# Patient Record
Sex: Male | Born: 1935 | Race: Black or African American | Hispanic: No | Marital: Married
Health system: Southern US, Community
[De-identification: ages and names within clinical notes are randomized; demographics above are authoritative.]

## PROBLEM LIST (undated history)

## (undated) DIAGNOSIS — J449 Chronic obstructive pulmonary disease, unspecified: Secondary | ICD-10-CM

## (undated) DIAGNOSIS — K635 Polyp of colon: Secondary | ICD-10-CM

## (undated) DIAGNOSIS — E119 Type 2 diabetes mellitus without complications: Secondary | ICD-10-CM

## (undated) DIAGNOSIS — D509 Iron deficiency anemia, unspecified: Secondary | ICD-10-CM

## (undated) DIAGNOSIS — N400 Enlarged prostate without lower urinary tract symptoms: Secondary | ICD-10-CM

## (undated) DIAGNOSIS — I10 Essential (primary) hypertension: Secondary | ICD-10-CM

## (undated) DIAGNOSIS — I739 Peripheral vascular disease, unspecified: Secondary | ICD-10-CM

## (undated) DIAGNOSIS — C3490 Malignant neoplasm of unspecified part of unspecified bronchus or lung: Secondary | ICD-10-CM

## (undated) DIAGNOSIS — I4892 Unspecified atrial flutter: Secondary | ICD-10-CM

## (undated) DIAGNOSIS — M1990 Unspecified osteoarthritis, unspecified site: Secondary | ICD-10-CM

## (undated) DIAGNOSIS — E785 Hyperlipidemia, unspecified: Secondary | ICD-10-CM

## (undated) DIAGNOSIS — I2510 Atherosclerotic heart disease of native coronary artery without angina pectoris: Secondary | ICD-10-CM

## (undated) HISTORY — DX: Atherosclerotic heart disease of native coronary artery without angina pectoris: I25.10

## (undated) HISTORY — DX: Type 2 diabetes mellitus without complications: E11.9

## (undated) HISTORY — DX: Unspecified osteoarthritis, unspecified site: M19.90

## (undated) HISTORY — DX: Peripheral vascular disease, unspecified: I73.9

## (undated) HISTORY — PX: SOFT TISSUE CYST EXCISION: SHX2418

---

## 1999-03-28 ENCOUNTER — Encounter: Payer: Self-pay | Admitting: Internal Medicine

## 1999-03-28 ENCOUNTER — Ambulatory Visit (HOSPITAL_COMMUNITY): Admission: RE | Admit: 1999-03-28 | Discharge: 1999-03-28 | Payer: Self-pay | Admitting: Internal Medicine

## 2000-01-07 ENCOUNTER — Emergency Department (HOSPITAL_COMMUNITY): Admission: EM | Admit: 2000-01-07 | Discharge: 2000-01-08 | Payer: Self-pay | Admitting: Emergency Medicine

## 2003-11-29 ENCOUNTER — Ambulatory Visit: Admission: RE | Admit: 2003-11-29 | Discharge: 2004-02-27 | Payer: Self-pay | Admitting: Radiation Oncology

## 2009-12-28 ENCOUNTER — Ambulatory Visit: Payer: Self-pay | Admitting: Vascular Surgery

## 2011-01-23 ENCOUNTER — Encounter (INDEPENDENT_AMBULATORY_CARE_PROVIDER_SITE_OTHER): Payer: Medicare Other

## 2011-01-23 ENCOUNTER — Ambulatory Visit (INDEPENDENT_AMBULATORY_CARE_PROVIDER_SITE_OTHER): Payer: Medicare Other | Admitting: Vascular Surgery

## 2011-01-23 DIAGNOSIS — I70219 Atherosclerosis of native arteries of extremities with intermittent claudication, unspecified extremity: Secondary | ICD-10-CM

## 2011-01-24 NOTE — Assessment & Plan Note (Signed)
OFFICE VISIT  Brendan Hall DOB:  [birth year]                                       01/23/2011 ZOXWR#:[id number]  I saw Brendan Hall in the office today for continued follow-up of his peripheral vascular disease.  I had originally seen in consultation on December 28, 2009 with evidence of bilateral infrainguinal arterial occlusive disease.  We encouraged him to get on a structured walking program.  Fortunately he is not a smoker.  He comes in for a yearly follow-up visit.  He states that he continues to have pain in both legs that is aggravated by ambulation and relieved with rest.  This for the most part involves his calves.  He has had no significant thigh or hip claudication.  His symptoms have been stable over the last year.  He has had no rest pain and no history of a nonhealing wound.  He does note occasional paresthesias in the right foot.  He has had no history of DVT.  PAST MEDICAL HISTORY:  Past medical history is not changed.  He has a history of diabetes, hypertension, hypercholesterolemia, and COPD.  SOCIAL HISTORY:  He is married.  He has 4 children.  He does not use tobacco.  REVIEW OF SYSTEMS:  CARDIOVASCULAR:  He has had no chest pain, chest pressure, palpitations or arrhythmias.  He does admit to dyspnea on exertion.  PULMONARY:  He does wear home O2 at times with his history of COPD.  He has had no recent productive cough, bronchitis, asthma or wheezing.  PHYSICAL EXAMINATION:  This is a pleasant 77 year old gentleman who appears his stated age. Blood pressure 121/79, heart rate is 81, saturation 96%. LUNGS:  Clear bilaterally to auscultation without rales, rhonchi or wheezing. CARDIOVASCULAR:  I do not detect any carotid bruits.  He has a regular rate and rhythm.  He has palpable femoral pulses.  I cannot palpate popliteal or pedal pulses on either side.  He has no significant lower extremity swelling. ABDOMEN:  Soft and  nontender with normal pitched bowel sounds. NEUROLOGIC:  He has no focal weakness or paresthesias. SKIN:  There are no ulcers or rashes.  I have independently interpreted his arterial Doppler study which shows biphasic Doppler signals in the right foot in the dorsalis pedis and posterior tibial positions.  ABI is 77% on the right which is stable. On the left side, he has a monophasic posterior tibial signal with a biphasic anterior tibial signal.  ABI is 76%.  I have explained that I think his infrainguinal arterial occlusive disease is stable.  I have encouraged him to stay as active as possible. He does not need routine follow-up with me unless his symptoms progress or he develops any new vascular issues.  I have ordered follow-up ABIs for 2 years.  He knows to call if he has any concerns.    Brendan Hall, M.D. Electronically Signed  CSD/MEDQ  D:  01/23/2011  T:  01/24/2011  Job:  [id number]  cc:   [place]

## 2011-04-09 NOTE — Consult Note (Signed)
NEW PATIENT CONSULTATION   BRYTEN, MAHER L  DOB:  Jul 08, 1930                                       12/28/2009  ZOXWR#:[id number]   I saw the patient in the office today in consultation concerning his  peripheral vascular disease.  This is a pleasant 77 year old gentleman  who was referred by Dr. Virgina Organ with abnormal ABIs.  He states that he  developed the gradual onset of claudication in both lower extremities  approximately 7 or 8 months ago.  He experiences claudication in his  thighs and calves bilaterally associated with ambulation and relieved  with rest.  His symptoms have been stable over the last 7-8 months.  There are no other aggravating or alleviating factors.  He does  occasionally have some paresthesias in his feet likely related to his  diabetes.  He has had no history of rest pain and no history of  nonhealing ulcers.   PAST MEDICAL HISTORY:  His past medical history is significant for adult  onset diabetes.  He does not require insulin.  In addition, he has a  history of hypertension and hypercholesterolemia.  He also has COPD.  He  denies any previous history of myocardial infarction or history of  congestive heart failure.   FAMILY HISTORY:  There is no history of premature cardiovascular  disease.   SOCIAL HISTORY:  He is married.  He has four children.  He quit tobacco  in 2008.  He had smoked one and a half packs per day for 50 years prior  to this.   MEDICATIONS:  Are documented on the medical history form in his chart.  Of note, he is on aspirin.   REVIEW OF SYSTEMS:  GENERAL:  He has had some weight gain recently.  He  is 178 pounds.  CARDIOVASCULAR:  He has had no chest pain, chest pressure, palpitations  or arrhythmias.  He does admit to dyspnea on exertion.  He has had no  history of stroke or TIAs.  He has had no history of DVT or phlebitis.  PULMONARY:  He has a productive cough at times with wheezing and he  occasionally  uses home O2.  GI:  He has problems with constipation occasionally.  MUSCULOSKELETAL:  He does have arthritis.  GU, psychiatric, ENT, hematologic, integumentary review of systems is  unremarkable.   PHYSICAL EXAMINATION:  General:  This is a pleasant 77 year old  gentleman who appears his stated age.  Vital signs:  His blood pressure  is 110/70, heart rate is 89 and saturation 97%.  HEENT:  Pupils are  equal, round, reactive to light and accommodation.  Extraocular motions  are intact.  Conjunctivae are normal.  Neck:  Supple.  There is no  jugular venous distention.  Lungs:  Are clear bilaterally to  auscultation without rales, rhonchi or wheezing.  Cardiovascular:  I do  not detect any carotid bruits.  He has a regular rate and rhythm without  murmur appreciated.  He has no significant peripheral edema.  He has  palpable radial and femoral pulses bilaterally.  I cannot palpate  popliteal or pedal pulses on either side.  Abdomen:  Soft and nontender  with normal pitched bowel sounds.  I cannot palpate an aneurysm.  He has  normal pitched bowel sounds.  Musculoskeletal:  There are no major  deformities or cyanosis.  Neurological:  There is no focal weakness or  paresthesias.  Skin:  There are no ulcers or rashes.   I did independently interpret his arterial Doppler study today which  shows a biphasic posterior tibial signal on the right with a monophasic  dorsalis pedis signal.  ABI on the right is 73%.  On the left side he  has a biphasic posterior tibial and dorsalis pedis signal with an ABI of  70%.   Based on his exam I think he has evidence of moderate infrainguinal  arterial occlusive disease bilaterally.  He likely has bilateral  superficial femoral artery occlusive disease.  Given ABIs of  approximately 70% bilaterally this would suggest disease at one level.  Fortunately he has quit smoking.  I have discussed with him the  importance of a structured walking program to help  maintain collaterals.  We would only consider arteriography and revascularization if his  symptoms progressed and he developed disabling claudication, rest pain  or nonhealing ulcer.  I plan on seeing him back in 1 year to be sure  that his symptoms have not progressed.  He knows to call sooner if he  has problems.  Will arrange for followup ABIs at that time.     Di Kindle. Edilia Bo, M.D.  Electronically Signed   CSD/MEDQ  D:  12/28/2009  T:  12/29/2009  Job:  [id number]   cc:   Dr Virgina Organ

## 2011-08-26 DIAGNOSIS — K635 Polyp of colon: Secondary | ICD-10-CM

## 2011-08-26 HISTORY — DX: Polyp of colon: K63.5

## 2011-08-27 MED ORDER — SODIUM CHLORIDE 0.45 % IV SOLN
Freq: Once | INTRAVENOUS | Status: AC
  Administered 2011-08-28: 09:00:00 via INTRAVENOUS

## 2011-08-28 ENCOUNTER — Other Ambulatory Visit (INDEPENDENT_AMBULATORY_CARE_PROVIDER_SITE_OTHER): Payer: Self-pay | Admitting: Internal Medicine

## 2011-08-28 ENCOUNTER — Encounter (HOSPITAL_COMMUNITY)
Admission: RE | Disposition: A | Discharge status: Home / Self Care | Payer: Self-pay | Source: Ambulatory Visit | Attending: Internal Medicine

## 2011-08-28 ENCOUNTER — Encounter (INDEPENDENT_AMBULATORY_CARE_PROVIDER_SITE_OTHER): Payer: Medicare Other | Admitting: Internal Medicine

## 2011-08-28 ENCOUNTER — Ambulatory Visit (HOSPITAL_COMMUNITY)
Admission: RE | Admit: 2011-08-28 | Discharge: 2011-08-28 | Disposition: A | Discharge status: Home / Self Care | Payer: Medicare Other | Source: Ambulatory Visit | Attending: Internal Medicine | Admitting: Internal Medicine

## 2011-08-28 ENCOUNTER — Encounter (HOSPITAL_COMMUNITY): Payer: Self-pay | Admitting: *Deleted

## 2011-08-28 DIAGNOSIS — D126 Benign neoplasm of colon, unspecified: Secondary | ICD-10-CM

## 2011-08-28 DIAGNOSIS — E119 Type 2 diabetes mellitus without complications: Secondary | ICD-10-CM | POA: Insufficient documentation

## 2011-08-28 DIAGNOSIS — E785 Hyperlipidemia, unspecified: Secondary | ICD-10-CM | POA: Insufficient documentation

## 2011-08-28 DIAGNOSIS — I10 Essential (primary) hypertension: Secondary | ICD-10-CM | POA: Insufficient documentation

## 2011-08-28 DIAGNOSIS — Z85118 Personal history of other malignant neoplasm of bronchus and lung: Secondary | ICD-10-CM | POA: Insufficient documentation

## 2011-08-28 DIAGNOSIS — Z79899 Other long term (current) drug therapy: Secondary | ICD-10-CM | POA: Insufficient documentation

## 2011-08-28 DIAGNOSIS — J449 Chronic obstructive pulmonary disease, unspecified: Secondary | ICD-10-CM | POA: Insufficient documentation

## 2011-08-28 DIAGNOSIS — Z1211 Encounter for screening for malignant neoplasm of colon: Secondary | ICD-10-CM | POA: Insufficient documentation

## 2011-08-28 DIAGNOSIS — Z01812 Encounter for preprocedural laboratory examination: Secondary | ICD-10-CM | POA: Insufficient documentation

## 2011-08-28 DIAGNOSIS — J4489 Other specified chronic obstructive pulmonary disease: Secondary | ICD-10-CM | POA: Insufficient documentation

## 2011-08-28 HISTORY — DX: Malignant neoplasm of unspecified part of unspecified bronchus or lung: C34.90

## 2011-08-28 HISTORY — DX: Hyperlipidemia, unspecified: E78.5

## 2011-08-28 HISTORY — DX: Essential (primary) hypertension: I10

## 2011-08-28 HISTORY — PX: COLONOSCOPY: SHX5424

## 2011-08-28 LAB — GLUCOSE, CAPILLARY: Glucose-Capillary: 104 mg/dL — ABNORMAL HIGH (ref 70–99)

## 2011-08-28 SURGERY — COLONOSCOPY
Anesthesia: Moderate Sedation

## 2011-08-28 MED ORDER — MIDAZOLAM HCL 5 MG/5ML IJ SOLN
INTRAMUSCULAR | Status: DC | PRN
  Administered 2011-08-28 (×2): 1 mg via INTRAVENOUS

## 2011-08-28 MED ORDER — MIDAZOLAM HCL 5 MG/5ML IJ SOLN
INTRAMUSCULAR | Status: AC
  Filled 2011-08-28: qty 10

## 2011-08-28 MED ORDER — MEPERIDINE HCL 50 MG/ML IJ SOLN
INTRAMUSCULAR | Status: DC | PRN
  Administered 2011-08-28: 25 mg via INTRAVENOUS

## 2011-08-28 MED ORDER — MEPERIDINE HCL 50 MG/ML IJ SOLN
INTRAMUSCULAR | Status: AC
  Filled 2011-08-28: qty 1

## 2011-08-28 NOTE — Op Note (Signed)
COLONOSCOPY PROCEDURE REPORT  PATIENT:  Brendan Hall  MR#:  [id number] Birthdate:  [birth year], 77 y.o., male Endoscopist:  Dr. Malissa Hippo, MD Referred By:  Dr. Colon Branch, MD Procedure Date: 08/28/2011  Procedure:   Colonoscopy  Indications:  Patient is 77 year old male who is undergoing screening colonoscopy. This is patient's first exam. Personal history is positive for lung carcinoma; he remains in remission  Informed Consent: Procedure and risks were reviewed with the patient and informed consent was obtained Medications:  Demerol 25 mg IV Versed 2 mg IV  Description of procedure:  After a digital rectal exam was performed, that colonoscope was advanced from the anus through the rectum and colon to the area of the cecum, ileocecal valve and appendiceal orifice. The cecum was deeply intubated. These structures were well-seen and photographed for the record. From the level of the cecum and ileocecal valve, the scope was slowly and cautiously withdrawn. The mucosal surfaces were carefully surveyed utilizing scope tip to flexion to facilitate fold flattening as needed. The scope was pulled down into the rectum where a thorough exam including retroflexion was performed.  Findings:   Preparation satisfactory. 4 mm polyp ablated via cold biopsy from ascending colon. 3 mm polyp ablated via cold biopsy from splenic flexure. Rest of the examination was normal. Unremarkable anorectal junction.    Therapeutic/Diagnostic Maneuvers Performed:  See above  Complications:  None  Cecal Withdrawal Time:  13 minutes  Impression:  Examination performed to cecum. 2 small polyps ablated via cold biopsy one from ascending colon and second one from splenic flexure.  Recommendations:  Standard instructions given Will be contacting patient with results of biopsy.  REHMAN,NAJEEB U  08/28/2011 10:02 AM  CC: Dr. Colon Branch, MD & Dr. Bonnetta Barry ref. provider found

## 2011-08-28 NOTE — H&P (Signed)
Brendan Hall is an 77 y.o. male.   Chief Complaint: Patient is here for screening colonoscopy HPI: Patient is 77 year old Afro-American male who is here for average risk screening colonoscopy. This is patient's first exam. Since he is been treated for lung carcinoma Dr. Virgina Organ felt that he should undergo this exam. She denies abdominal pain rectal bleeding or change in his bowel habits. He has COPD since he had lung surgery for CA about 6 years ago. He uses inhalers every day. He states if he walks at his own pace he does not get short of breath.  Past Medical History  Diagnosis Date  . Myocardial infarction     30 years ago  . Lung cancer   . Shortness of breath   . Hyperlipidemia   . Hypertension   . Diabetes mellitus   . Arthritis     Past Surgical History  Procedure Date  . Cyst removed from face     History reviewed. No pertinent family history. Social History:  reports that he has quit smoking. He does not have any smokeless tobacco history on file. He reports that he drinks alcohol. He reports that he does not use illicit drugs.  Allergies: No Known Allergies  Medications Prior to Admission  Medication Dose Route Frequency Provider Last Rate Last Dose  . 0.45 % sodium chloride infusion   Intravenous Once Malissa Hippo, MD 20 mL/hr at 08/28/11 0847    . meperidine (DEMEROL) 50 MG/ML injection           . midazolam (VERSED) 5 MG/5ML injection            Medications Prior to Admission  Medication Sig Dispense Refill  . Fluticasone-Salmeterol (ADVAIR) 250-50 MCG/DOSE AEPB Inhale 1 puff into the lungs every 12 (twelve) hours.        . furosemide (LASIX) 40 MG tablet Take 40 mg by mouth daily.        Marland Kitchen glyBURIDE-metformin (GLUCOVANCE) 5-500 MG per tablet Take 1 tablet by mouth 3 (three) times daily.        . simvastatin (ZOCOR) 40 MG tablet Take 40 mg by mouth at bedtime.        . Tamsulosin HCl (FLOMAX) 0.4 MG CAPS Take 0.4 mg by mouth daily.          Results for  orders placed during the hospital encounter of 08/28/11 (from the past 48 hour(s))  GLUCOSE, CAPILLARY     Status: Abnormal   Collection Time   08/28/11  8:40 AM      Component Value Range Comment   Glucose-Capillary 104 (*) 70 - 99 (mg/dL)    No results found.  Review of Systems  Constitutional: Negative for weight loss.  Respiratory: Positive for shortness of breath (if he walks too fast).   Gastrointestinal: Negative for abdominal pain, diarrhea, constipation, blood in stool and melena.    Blood pressure 133/95, pulse 90, temperature 98.4 F (36.9 C), temperature source Oral, resp. rate 18, height 5\' 7"  (1.702 m), weight 172 lb (78.019 kg), SpO2 100.00%. Physical Exam  Constitutional: He appears well-developed and well-nourished.  HENT:  Mouth/Throat: Oropharynx is clear and moist.  Eyes: Conjunctivae are normal. No scleral icterus.  Neck: No thyromegaly present.  Cardiovascular: Normal rate, regular rhythm and normal heart sounds.   No murmur heard. Respiratory: Effort normal. He has no wheezes. He has no rales.       Increased AP diameter Degrees intensity of breath sounds bilaterally.  GI: Soft.  He exhibits no distension and no mass. There is no tenderness.  Musculoskeletal: He exhibits no edema.  Lymphadenopathy:    He has no cervical adenopathy.  Neurological: He is alert.  Skin: Skin is warm and dry.     Assessment/Plan Average risk screening colonoscopy.  REHMAN,NAJEEB U 08/28/2011, 9:20 AM

## 2011-09-04 ENCOUNTER — Encounter (HOSPITAL_COMMUNITY): Payer: Self-pay | Admitting: Internal Medicine

## 2011-09-04 ENCOUNTER — Encounter (INDEPENDENT_AMBULATORY_CARE_PROVIDER_SITE_OTHER): Payer: Self-pay | Admitting: *Deleted

## 2011-11-14 ENCOUNTER — Emergency Department (HOSPITAL_COMMUNITY): Payer: Medicare Other

## 2011-11-14 ENCOUNTER — Encounter (HOSPITAL_COMMUNITY): Payer: Self-pay | Admitting: *Deleted

## 2011-11-14 ENCOUNTER — Inpatient Hospital Stay (HOSPITAL_COMMUNITY)
Admission: EM | Admit: 2011-11-14 | Discharge: 2011-11-18 | DRG: 191 | Disposition: A | Discharge status: Home Health | Payer: Medicare Other | Attending: Internal Medicine | Admitting: Internal Medicine

## 2011-11-14 ENCOUNTER — Other Ambulatory Visit: Payer: Self-pay

## 2011-11-14 DIAGNOSIS — N289 Disorder of kidney and ureter, unspecified: Secondary | ICD-10-CM

## 2011-11-14 DIAGNOSIS — N179 Acute kidney failure, unspecified: Secondary | ICD-10-CM | POA: Diagnosis present

## 2011-11-14 DIAGNOSIS — E7800 Pure hypercholesterolemia, unspecified: Secondary | ICD-10-CM

## 2011-11-14 DIAGNOSIS — IMO0001 Reserved for inherently not codable concepts without codable children: Secondary | ICD-10-CM | POA: Diagnosis present

## 2011-11-14 DIAGNOSIS — Z85118 Personal history of other malignant neoplasm of bronchus and lung: Secondary | ICD-10-CM

## 2011-11-14 DIAGNOSIS — J440 Chronic obstructive pulmonary disease with acute lower respiratory infection: Principal | ICD-10-CM | POA: Diagnosis present

## 2011-11-14 DIAGNOSIS — N138 Other obstructive and reflux uropathy: Secondary | ICD-10-CM | POA: Diagnosis present

## 2011-11-14 DIAGNOSIS — N281 Cyst of kidney, acquired: Secondary | ICD-10-CM | POA: Diagnosis present

## 2011-11-14 DIAGNOSIS — E119 Type 2 diabetes mellitus without complications: Secondary | ICD-10-CM | POA: Diagnosis present

## 2011-11-14 DIAGNOSIS — J209 Acute bronchitis, unspecified: Principal | ICD-10-CM | POA: Diagnosis present

## 2011-11-14 DIAGNOSIS — D509 Iron deficiency anemia, unspecified: Secondary | ICD-10-CM | POA: Diagnosis present

## 2011-11-14 DIAGNOSIS — J441 Chronic obstructive pulmonary disease with (acute) exacerbation: Secondary | ICD-10-CM

## 2011-11-14 DIAGNOSIS — D649 Anemia, unspecified: Secondary | ICD-10-CM

## 2011-11-14 DIAGNOSIS — Z860100 Personal history of colon polyps, unspecified: Secondary | ICD-10-CM

## 2011-11-14 DIAGNOSIS — N401 Enlarged prostate with lower urinary tract symptoms: Secondary | ICD-10-CM | POA: Diagnosis present

## 2011-11-14 DIAGNOSIS — R339 Retention of urine, unspecified: Secondary | ICD-10-CM

## 2011-11-14 DIAGNOSIS — N2889 Other specified disorders of kidney and ureter: Secondary | ICD-10-CM

## 2011-11-14 HISTORY — DX: Benign prostatic hyperplasia without lower urinary tract symptoms: N40.0

## 2011-11-14 HISTORY — DX: Iron deficiency anemia, unspecified: D50.9

## 2011-11-14 HISTORY — DX: Chronic obstructive pulmonary disease, unspecified: J44.9

## 2011-11-14 HISTORY — DX: Polyp of colon: K63.5

## 2011-11-14 LAB — DIFFERENTIAL
Eosinophils Relative: 2 % (ref 0–5)
Lymphocytes Relative: 20 % (ref 12–46)
Lymphs Abs: 1.2 10*3/uL (ref 0.7–4.0)
Monocytes Relative: 10 % (ref 3–12)

## 2011-11-14 LAB — CBC
HCT: 39.4 % (ref 39.0–52.0)
Hemoglobin: 11.4 g/dL — ABNORMAL LOW (ref 13.0–17.0)
MCV: 85.5 fL (ref 78.0–100.0)
Platelets: 304 10*3/uL (ref 150–400)
RBC: 4.61 MIL/uL (ref 4.22–5.81)
WBC: 6.2 10*3/uL (ref 4.0–10.5)

## 2011-11-14 LAB — BASIC METABOLIC PANEL
CO2: 28 mEq/L (ref 19–32)
Calcium: 10.3 mg/dL (ref 8.4–10.5)
Chloride: 105 mEq/L (ref 96–112)
Potassium: 4.1 mEq/L (ref 3.5–5.1)
Sodium: 142 mEq/L (ref 135–145)

## 2011-11-14 LAB — POCT I-STAT TROPONIN I: Troponin i, poc: 0 ng/mL (ref 0.00–0.08)

## 2011-11-14 MED ORDER — HEPARIN SODIUM (PORCINE) 5000 UNIT/ML IJ SOLN
5000.0000 [IU] | Freq: Three times a day (TID) | INTRAMUSCULAR | Status: DC
  Administered 2011-11-14 – 2011-11-18 (×10): 5000 [IU] via SUBCUTANEOUS
  Filled 2011-11-14 (×9): qty 1

## 2011-11-14 MED ORDER — ACETAMINOPHEN 325 MG PO TABS: 650.0000 mg | ORAL_TABLET | Freq: Four times a day (QID) | ORAL | Status: DC | PRN

## 2011-11-14 MED ORDER — SODIUM CHLORIDE 0.9 % IV SOLN: INTRAVENOUS | Status: DC

## 2011-11-14 MED ORDER — ACETAMINOPHEN 650 MG RE SUPP: 650.0000 mg | Freq: Four times a day (QID) | RECTAL | Status: DC | PRN

## 2011-11-14 MED ORDER — ALBUTEROL SULFATE (5 MG/ML) 0.5% IN NEBU
10.0000 mg | INHALATION_SOLUTION | Freq: Once | RESPIRATORY_TRACT | Status: AC
  Administered 2011-11-14: 10 mg via RESPIRATORY_TRACT
  Filled 2011-11-14: qty 0.5
  Filled 2011-11-14: qty 2

## 2011-11-14 MED ORDER — ASPIRIN EC 81 MG PO TBEC
81.0000 mg | DELAYED_RELEASE_TABLET | Freq: Every day | ORAL | Status: DC
  Administered 2011-11-14 – 2011-11-18 (×5): 81 mg via ORAL
  Filled 2011-11-14 (×5): qty 1

## 2011-11-14 MED ORDER — FUROSEMIDE 40 MG PO TABS
40.0000 mg | ORAL_TABLET | Freq: Every day | ORAL | Status: DC
  Administered 2011-11-14 – 2011-11-18 (×5): 40 mg via ORAL
  Filled 2011-11-14 (×5): qty 1

## 2011-11-14 MED ORDER — BIOTENE DRY MOUTH MT LIQD
15.0000 mL | Freq: Two times a day (BID) | OROMUCOSAL | Status: DC
  Administered 2011-11-14 – 2011-11-18 (×8): 15 mL via OROMUCOSAL

## 2011-11-14 MED ORDER — INSULIN ASPART 100 UNIT/ML ~~LOC~~ SOLN
0.0000 [IU] | Freq: Every day | SUBCUTANEOUS | Status: DC
  Administered 2011-11-14: 4 [IU] via SUBCUTANEOUS
  Administered 2011-11-15 – 2011-11-16 (×2): 3 [IU] via SUBCUTANEOUS
  Administered 2011-11-17: 2 [IU] via SUBCUTANEOUS

## 2011-11-14 MED ORDER — METHYLPREDNISOLONE SODIUM SUCC 125 MG IJ SOLR
60.0000 mg | Freq: Four times a day (QID) | INTRAMUSCULAR | Status: DC
  Administered 2011-11-15 – 2011-11-17 (×10): 60 mg via INTRAVENOUS
  Filled 2011-11-14 (×8): qty 2

## 2011-11-14 MED ORDER — IPRATROPIUM BROMIDE 0.02 % IN SOLN
1.0000 mg | Freq: Once | RESPIRATORY_TRACT | Status: AC
  Administered 2011-11-14: 1 mg via RESPIRATORY_TRACT
  Filled 2011-11-14 (×2): qty 2.5

## 2011-11-14 MED ORDER — INSULIN ASPART 100 UNIT/ML ~~LOC~~ SOLN
0.0000 [IU] | Freq: Three times a day (TID) | SUBCUTANEOUS | Status: DC
  Administered 2011-11-15: 15 [IU] via SUBCUTANEOUS
  Administered 2011-11-15 – 2011-11-16 (×2): 11 [IU] via SUBCUTANEOUS
  Administered 2011-11-16: 3 [IU] via SUBCUTANEOUS
  Administered 2011-11-16 – 2011-11-17 (×4): 7 [IU] via SUBCUTANEOUS
  Administered 2011-11-18: 4 [IU] via SUBCUTANEOUS
  Administered 2011-11-18: 7 [IU] via SUBCUTANEOUS
  Filled 2011-11-14: qty 3

## 2011-11-14 MED ORDER — ALBUTEROL SULFATE (5 MG/ML) 0.5% IN NEBU
2.5000 mg | INHALATION_SOLUTION | RESPIRATORY_TRACT | Status: DC
  Administered 2011-11-14 – 2011-11-17 (×14): 2.5 mg via RESPIRATORY_TRACT
  Filled 2011-11-14 (×14): qty 0.5

## 2011-11-14 MED ORDER — IPRATROPIUM BROMIDE 0.02 % IN SOLN
0.5000 mg | RESPIRATORY_TRACT | Status: DC
  Administered 2011-11-14 – 2011-11-17 (×15): 0.5 mg via RESPIRATORY_TRACT
  Filled 2011-11-14 (×14): qty 2.5

## 2011-11-14 MED ORDER — SODIUM CHLORIDE 0.9 % IV SOLN
INTRAVENOUS | Status: DC
  Administered 2011-11-14: 14:00:00 via INTRAVENOUS

## 2011-11-14 MED ORDER — MORPHINE SULFATE 2 MG/ML IJ SOLN: 1.0000 mg | INTRAMUSCULAR | Status: DC | PRN

## 2011-11-14 MED ORDER — MOXIFLOXACIN HCL IN NACL 400 MG/250ML IV SOLN
400.0000 mg | INTRAVENOUS | Status: DC
  Administered 2011-11-15 – 2011-11-16 (×2): 400 mg via INTRAVENOUS
  Filled 2011-11-14 (×3): qty 250

## 2011-11-14 MED ORDER — ONDANSETRON HCL 4 MG PO TABS: 4.0000 mg | ORAL_TABLET | Freq: Four times a day (QID) | ORAL | Status: DC | PRN

## 2011-11-14 MED ORDER — METHYLPREDNISOLONE SODIUM SUCC 125 MG IJ SOLR
125.0000 mg | Freq: Once | INTRAMUSCULAR | Status: AC
  Administered 2011-11-14: 125 mg via INTRAVENOUS
  Filled 2011-11-14: qty 2

## 2011-11-14 MED ORDER — ALBUTEROL SULFATE (5 MG/ML) 0.5% IN NEBU
2.5000 mg | INHALATION_SOLUTION | RESPIRATORY_TRACT | Status: DC | PRN
  Administered 2011-11-18: 2.5 mg via RESPIRATORY_TRACT
  Filled 2011-11-14: qty 0.5

## 2011-11-14 MED ORDER — METHYLPREDNISOLONE SODIUM SUCC 125 MG IJ SOLR
80.0000 mg | Freq: Once | INTRAMUSCULAR | Status: AC
  Administered 2011-11-14: 80 mg via INTRAVENOUS
  Filled 2011-11-14: qty 2

## 2011-11-14 MED ORDER — MOXIFLOXACIN HCL IN NACL 400 MG/250ML IV SOLN
400.0000 mg | Freq: Once | INTRAVENOUS | Status: AC
  Administered 2011-11-14: 400 mg via INTRAVENOUS
  Filled 2011-11-14: qty 250

## 2011-11-14 MED ORDER — TAMSULOSIN HCL 0.4 MG PO CAPS
0.4000 mg | ORAL_CAPSULE | Freq: Every day | ORAL | Status: DC
  Administered 2011-11-14 – 2011-11-18 (×5): 0.4 mg via ORAL
  Filled 2011-11-14 (×5): qty 1

## 2011-11-14 MED ORDER — SODIUM CHLORIDE 0.9 % IJ SOLN
3.0000 mL | Freq: Two times a day (BID) | INTRAMUSCULAR | Status: DC
  Administered 2011-11-14 – 2011-11-18 (×5): 3 mL via INTRAVENOUS
  Filled 2011-11-14 (×7): qty 3

## 2011-11-14 MED ORDER — FLUTICASONE-SALMETEROL 250-50 MCG/DOSE IN AEPB
1.0000 | INHALATION_SPRAY | Freq: Two times a day (BID) | RESPIRATORY_TRACT | Status: DC
  Administered 2011-11-15 – 2011-11-18 (×6): 1 via RESPIRATORY_TRACT
  Filled 2011-11-14: qty 14

## 2011-11-14 MED ORDER — DOCUSATE SODIUM 100 MG PO CAPS
100.0000 mg | ORAL_CAPSULE | Freq: Two times a day (BID) | ORAL | Status: DC
  Administered 2011-11-14 – 2011-11-18 (×8): 100 mg via ORAL
  Filled 2011-11-14 (×9): qty 1

## 2011-11-14 MED ORDER — ONDANSETRON HCL 4 MG/2ML IJ SOLN: 4.0000 mg | Freq: Four times a day (QID) | INTRAMUSCULAR | Status: DC | PRN

## 2011-11-14 MED ORDER — SIMVASTATIN 20 MG PO TABS
40.0000 mg | ORAL_TABLET | Freq: Every day | ORAL | Status: DC
  Administered 2011-11-14 – 2011-11-17 (×4): 40 mg via ORAL
  Filled 2011-11-14 (×4): qty 2

## 2011-11-14 NOTE — H&P (Signed)
Brendan Hall is an 77 y.o. male.    PCP: Colon Branch, MD Patient is also followed by an oncologist.  Chief Complaint: Shortness of breath. For 7 days  HPI: This is a 77 year old, African American male, with a past medical history of COPD, lung cancer, diabetes, hypertension, who was in his usual state of health about 7 days ago, when he started having shortness of breath. This symptom progressively worsened. He went to his primary care physician's office yesterday and was prescribed doxycycline and was given a prescription for albuterol inhaler. Patient took these medications, however, did not get any relief and so, he decided to come to the hospital for further evaluation. He does use nebulizer treatments at home occasionally, but even with that patient did not get any relief. Along with the symptom of shortness of breath he had cough with above, brownish expectoration. Denies any blood in the sputum. Denies any nausea, vomiting. Denies any chest pain. Denies any palpitations. He felt hot, but cannot tell me if he had a fever. He had some sweating episodes. Denies any leg swelling. He is up-to-date on his immunizations. Having received breathing treatments in the emergency department patient is feeling somewhat better.   Prior to Admission medications   Medication Sig Start Date End Date Taking? Authorizing Provider  albuterol (PROVENTIL HFA;VENTOLIN HFA) 108 (90 BASE) MCG/ACT inhaler Inhale 2 puffs into the lungs every 6 (six) hours as needed. For shortness of breath    Yes Historical Provider, MD  aspirin EC 81 MG tablet Take 81 mg by mouth daily.     Yes Historical Provider, MD  doxycycline (VIBRA-TABS) 100 MG tablet Take 100 mg by mouth 2 (two) times daily. Take 1 tablet twice daily for 14 days, started 11/12/11    Yes Historical Provider, MD  Fluticasone-Salmeterol (ADVAIR) 250-50 MCG/DOSE AEPB Inhale 1 puff into the lungs every 12 (twelve) hours.     Yes Historical Provider, MD    furosemide (LASIX) 40 MG tablet Take 40 mg by mouth daily.     Yes Historical Provider, MD  glyBURIDE-metformin (GLUCOVANCE) 5-500 MG per tablet Take 1 tablet by mouth 3 (three) times daily.     Yes Historical Provider, MD  simvastatin (ZOCOR) 40 MG tablet Take 40 mg by mouth at bedtime.     Yes Historical Provider, MD  Tamsulosin HCl (FLOMAX) 0.4 MG CAPS Take 0.4 mg by mouth daily.     Yes Historical Provider, MD    Allergies: No Known Allergies  Past Medical History  Diagnosis Date  . Myocardial infarction     30 years ago  . Lung cancer   . Shortness of breath   . Hyperlipidemia   . Hypertension   . Diabetes mellitus   . Arthritis   . COPD (chronic obstructive pulmonary disease)   . On home oxygen therapy     at night only  . Anemia 11/14/2011  . Colon polyps 08/2011  . BPH (benign prostatic hyperplasia)     Past Surgical History  Procedure Date  . Cyst removed from face   . Colonoscopy 08/28/2011    Procedure: COLONOSCOPY;  Surgeon: Malissa Hippo, MD;  Location: [facility];  Service: Endoscopy;  Laterality: N/A;    Social History:  reports that he quit smoking about 3 years ago. His smoking use included Cigarettes. He has a 90 pack-year smoking history. He does not have any smokeless tobacco history on file. He reports that he drinks alcohol. He reports that he  does not use illicit drugs.  Family History:  Family History  Problem Relation Age of Onset  . Cancer Mother   . Cancer Sister   . Cancer Brother     Review of Systems - History obtained from the patient General ROS: positive for  - fatigue Psychological ROS: negative Ophthalmic ROS: negative ENT ROS: negative Allergy and Immunology ROS: negative Hematological and Lymphatic ROS: negative Endocrine ROS: negative Respiratory ROS: as in hpi Cardiovascular ROS: as in hpi Gastrointestinal ROS: negative Genito-Urinary ROS: negative Musculoskeletal ROS: positive for - joint pain Neurological ROS:  negative Dermatological ROS: negative  Physical Examination Blood pressure 173/131, pulse 115, temperature 98.8 F (37.1 C), temperature source Oral, resp. rate 27, height 5\' 8"  (1.727 m), weight 75.751 kg (167 lb), SpO2 94.00%.  General appearance: alert, cooperative, appears stated age and no distress Head: Normocephalic, without obvious abnormality, atraumatic Eyes: conjunctivae/corneas clear. PERRL, EOM's intact. Fundi benign. Throat: lips, mucosa, and tongue normal; teeth and gums normal Neck: no adenopathy, no carotid bruit, no JVD, supple, symmetrical, trachea midline and thyroid not enlarged, symmetric, no tenderness/mass/nodules Resp: bronchophony posterior - right, diminished breath sounds RML, rhonchi RLL and wheezes bibasilar Cardio: regular rate and rhythm, S1, S2 normal, no murmur, click, rub or gallop GI: soft, non-tender; bowel sounds normal; no masses,  no organomegaly Extremities: extremities normal, atraumatic, no cyanosis or edema Pulses: 2+ and symmetric Skin: Skin color, texture, turgor normal. No rashes or lesions Lymph nodes: Cervical, supraclavicular, and axillary nodes normal. Neurologic: Alert and oriented X 3, normal strength and tone. Normal symmetric reflexes. Normal coordination and gait  Results for orders placed during the hospital encounter of 11/14/11 (from the past 48 hour(s))  BASIC METABOLIC PANEL     Status: Abnormal   Collection Time   11/14/11  1:56 PM      Component Value Range Comment   Sodium 142  135 - 145 (mEq/L)    Potassium 4.1  3.5 - 5.1 (mEq/L)    Chloride 105  96 - 112 (mEq/L)    CO2 28  19 - 32 (mEq/L)    Glucose, Bld 160 (*) 70 - 99 (mg/dL)    BUN 14  6 - 23 (mg/dL)    Creatinine, Ser 4.09 (*) 0.50 - 1.35 (mg/dL)    Calcium 81.1  8.4 - 10.5 (mg/dL)    GFR calc non Af Amer 47 (*) >90 (mL/min)    GFR calc Af Amer 55 (*) >90 (mL/min)   CBC     Status: Abnormal   Collection Time   11/14/11  1:56 PM      Component Value Range  Comment   WBC 6.2  4.0 - 10.5 (K/uL)    RBC 4.61  4.22 - 5.81 (MIL/uL)    Hemoglobin 11.4 (*) 13.0 - 17.0 (g/dL)    HCT 91.4  78.2 - 95.6 (%)    MCV 85.5  78.0 - 100.0 (fL)    MCH 24.7 (*) 26.0 - 34.0 (pg)    MCHC 28.9 (*) 30.0 - 36.0 (g/dL)    RDW 21.3 (*) 08.6 - 15.5 (%)    Platelets 304  150 - 400 (K/uL)   DIFFERENTIAL     Status: Normal   Collection Time   11/14/11  1:56 PM      Component Value Range Comment   Neutrophils Relative 68  43 - 77 (%)    Neutro Abs 4.2  1.7 - 7.7 (K/uL)    Lymphocytes Relative 20  12 - 46 (%)  Lymphs Abs 1.2  0.7 - 4.0 (K/uL)    Monocytes Relative 10  3 - 12 (%)    Monocytes Absolute 0.6  0.1 - 1.0 (K/uL)    Eosinophils Relative 2  0 - 5 (%)    Eosinophils Absolute 0.1  0.0 - 0.7 (K/uL)    Basophils Relative 1  0 - 1 (%)    Basophils Absolute 0.0  0.0 - 0.1 (K/uL)   POCT I-STAT TROPONIN I     Status: Normal   Collection Time   11/14/11  2:42 PM      Component Value Range Comment   Troponin i, poc 0.00  0.00 - 0.08 (ng/mL)    Comment 3             Dg Chest 2 View  11/14/2011  *RADIOLOGY REPORT*  Clinical Data: Shortness of breath, COPD.  CHEST - 2 VIEW  Comparison: 11/14/2011  Findings: There is hyperinflation of the lungs compatible with COPD.  Scarring noted in the right medial upper lobe.  Heart is upper limits normal in size.  No acute opacities or effusions.  No acute bony abnormality.  IMPRESSION: COPD/chronic changes.  No active disease.  Original Report Authenticated By: Cyndie Chime, M.D.   Dg Chest Port 1 View  11/14/2011  *RADIOLOGY REPORT*  Clinical Data: Rule out infiltrate.  Shortness of breath and weakness.  PORTABLE CHEST - 1 VIEW  Comparison: None  Findings: There is rotational artifact.  The heart size appears normal.  No pleural effusion or pulmonary edema identified.  Asymmetric indeterminant opacity within the right upper lobe is partially obscured by overlying mediastinal structures.  IMPRESSION:  1.  Exam detail diminish  due to rotational artifact. 2.  Indeterminant opacity in the right upper lobe which may represent an infiltrate.  Suggest repeat imaging with upright PA and lateral chest radiograph.  Original Report Authenticated By: Rosealee Albee, M.D.   EKG shows a sinus tachycardia at 106. Axis is normal. EKG does show first degree AV block. No Q waves are seen. No concerning ST or T-wave changes are noted.  Assessment/Plan  Principal Problem:  *COPD with exacerbation Active Problems:  Acute renal failure  Anemia  DM type 2 (diabetes mellitus, type 2)  History of lung cancer   #1 COPD with exacerbation: This will be treated with the nebulizer treatments, steroids, antibiotics. Advair will be continued. We anticipate patient staying at least 2 days in the hospital. He quit smoking 3 years ago, and I encouraged him to stay off cigarettes.  #2 renal insufficiency. We don't have any old creatinine on this gentleman in our system, that I can determine. This could be his baseline. We will recheck his creatinine in the morning. He could have chronic kidney disease. Continue with Lasix for now.  #3 history of, diabetes: With the steroids the blood sugar will stay elevated. We'll put him on a resistant Sliding scale. We'll check HbA1c.  #4 history of lung cancer: He received chemotherapy and radiation for the same about 7 years ago. Did not require any surgery. Cancer has been in remission according to the patient since then.  #5 mild anemia: Will check a repeat CBC in the morning. Anemia panel may be considered at that time.  DVT, prophylaxis with heparin. Physical therapy and occupational therapy will be involved in the patient's care as well to avoid deconditioning.  Further management decisions will depend on results of further testing and patient's response to treatment.  Osvaldo Shipper  [facility] Pager [phone]  11/14/2011, 7:40 PM

## 2011-11-14 NOTE — ED Notes (Signed)
Shortness of breath, cough, fever.  Brown sputum

## 2011-11-14 NOTE — ED Notes (Signed)
Pt's daughter left me her number, Rolanda Jay [phone] and the pt's wife is at [phone]; both numbers called and message left to inform them of room number

## 2011-11-14 NOTE — ED Notes (Signed)
Pt ambulated in the hall to nurses' station with O2 sats dropping to 88 and HR in 120's

## 2011-11-14 NOTE — ED Provider Notes (Signed)
History   This chart was scribed for Laray Anger, DO by Clarita Crane. The patient was seen in room APA10/[facility]10 and the patient's care was started at 1:35PM.   CSN: [id number]  Arrival date & time 11/14/11  1311   Chief Complaint  Patient presents with  . Shortness of Breath   HPI Pt was seen at 1:35 PM Brendan Hall is a 77 y.o. male seen at 1:35 PM who presents to the Emergency Department complaining of gradual onset and worsening of persistent cough, SOB and "wheezing" for the past week.  Unsure if he has had a fever at home.  Reports having h/o emphysema which is currently treated with Advair and O2 at night. Patient notes having previous hospital admissions for similar complaints.  Denies CP/palpitations, no back pain, no abd pain, no N/V/D.    Past Medical History  Diagnosis Date  . Myocardial infarction     30 years ago  . Lung cancer   . Shortness of breath   . Hyperlipidemia   . Hypertension   . Diabetes mellitus   . Arthritis   . COPD (chronic obstructive pulmonary disease)   . On home oxygen therapy     at night only    Past Surgical History  Procedure Date  . Cyst removed from face   . Colonoscopy 08/28/2011    Procedure: COLONOSCOPY;  Surgeon: Malissa Hippo, MD;  Location: [facility];  Service: Endoscopy;  Laterality: N/A;    History  Substance Use Topics  . Smoking status: Former Smoker -- 1.5 packs/day for 60 years    Types: Cigarettes  . Smokeless tobacco: Not on file  . Alcohol Use: Yes     occasional     Review of Systems ROS: Statement: All systems negative except as marked or noted in the HPI; Constitutional: Negative for fever and chills. ; ; Eyes: Negative for eye pain, redness and discharge. ; ; ENMT: Negative for ear pain, hoarseness, nasal congestion, sinus pressure and sore throat. ; ; Cardiovascular: Negative for chest pain, palpitations, diaphoresis, and peripheral edema. ; ; Respiratory: Positive for cough, wheezing, SOB;  and negative for stridor. ; ; Gastrointestinal: Negative for nausea, vomiting, diarrhea, abdominal pain, blood in stool, hematemesis, jaundice and rectal bleeding. . ; ; Genitourinary: Negative for dysuria, flank pain and hematuria. ; ; Musculoskeletal: Negative for back pain and neck pain. Negative for swelling and trauma.; ; Skin: Negative for pruritus, rash, abrasions, blisters, bruising and skin lesion.; ; Neuro: Negative for headache, lightheadedness and neck stiffness. Negative for weakness, altered level of consciousness , altered mental status, extremity weakness, paresthesias, involuntary movement, seizure and syncope.     Allergies  Review of patient's allergies indicates no known allergies.  Home Medications   Current Outpatient Rx  Name Route Sig Dispense Refill  . ALBUTEROL SULFATE HFA 108 (90 BASE) MCG/ACT IN AERS Inhalation Inhale 2 puffs into the lungs every 6 (six) hours as needed. For shortness of breath     . ASPIRIN EC 81 MG PO TBEC Oral Take 81 mg by mouth daily.      Marland Kitchen DOXYCYCLINE HYCLATE 100 MG PO TABS Oral Take 100 mg by mouth 2 (two) times daily. Take 1 tablet twice daily for 14 days, started 11/12/11     . FLUTICASONE-SALMETEROL 250-50 MCG/DOSE IN AEPB Inhalation Inhale 1 puff into the lungs every 12 (twelve) hours.      . FUROSEMIDE 40 MG PO TABS Oral Take 40 mg by mouth  daily.      . GLYBURIDE-METFORMIN 5-500 MG PO TABS Oral Take 1 tablet by mouth 3 (three) times daily.      Marland Kitchen SIMVASTATIN 40 MG PO TABS Oral Take 40 mg by mouth at bedtime.      . TAMSULOSIN HCL 0.4 MG PO CAPS Oral Take 0.4 mg by mouth daily.        BP 120/76  Pulse 112  Temp(Src) 98.8 F (37.1 C) (Oral)  Resp 30  Ht 5\' 8"  (1.727 m)  Wt 167 lb (75.751 kg)  BMI 25.39 kg/m2  SpO2 99%  Physical Exam 1340: Physical examination:  Nursing notes reviewed; Vital signs and O2 SAT reviewed;  Constitutional: Well developed, Well nourished, Uncomfortable appearing; Head:  Normocephalic, atraumatic; Eyes:  EOMI, PERRL, No scleral icterus; ENMT: Mouth and pharynx normal, Mucous membranes dry; Neck: Supple, Full range of motion, No lymphadenopathy; Cardiovascular: Tachycardic rate and regular rhythm, No murmur, rub, or gallop; Respiratory: Breath sounds diminished & equal bilaterally with scattered faint insp/exp wheezes, No audible wheezing.  +tachypneic, speaking in phrases, sitting upright; Chest: Nontender, Movement normal; Abdomen: Soft, Nontender, Nondistended, Normal bowel sounds; Extremities: Pulses normal, No tenderness, No edema, No calf edema or asymmetry.; Neuro: AA&Ox3, Major CN grossly intact.  No gross focal motor or sensory deficits in extremities.; Skin: Color normal, Warm, Dry.   ED Course  Procedures   1340:  Sats 86% R/A on my arrival to ED exam room.  Sats improve to low 90's with O2 N/C.  Will give IV solumedrol, start continuous neb.   MDM  MDM Reviewed: nursing note, vitals and previous chart Reviewed previous: ECG Interpretation: ECG, labs and x-ray   CRITICAL CARE Performed by: Laray Anger Total critical care time: 31 Critical care time was exclusive of separately billable procedures and treating other patients. Critical care was necessary to treat or prevent imminent or life-threatening deterioration. Critical care was time spent personally by me on the following activities: development of treatment plan with patient and/or surrogate as well as nursing, discussions with consultants, evaluation of patient's response to treatment, examination of patient, obtaining history from patient or surrogate, ordering and performing treatments and interventions, ordering and review of laboratory studies, ordering and review of radiographic studies, pulse oximetry and re-evaluation of patient's condition.   Date: 11/14/2011  Rate: 106  Rhythm: sinus tachycardia  QRS Axis: normal  Intervals: normal  ST/T Wave abnormalities: normal  Conduction Disutrbances:first-degree A-V  block   Narrative Interpretation:   Old EKG Reviewed: none available.  Results for orders placed during the hospital encounter of 11/14/11  BASIC METABOLIC PANEL      Component Value Range   Sodium 142  135 - 145 (mEq/L)   Potassium 4.1  3.5 - 5.1 (mEq/L)   Chloride 105  96 - 112 (mEq/L)   CO2 28  19 - 32 (mEq/L)   Glucose, Bld 160 (*) 70 - 99 (mg/dL)   BUN 14  6 - 23 (mg/dL)   Creatinine, Ser 1.61 (*) 0.50 - 1.35 (mg/dL)   Calcium 09.6  8.4 - 10.5 (mg/dL)   GFR calc non Af Amer 47 (*) >90 (mL/min)   GFR calc Af Amer 55 (*) >90 (mL/min)  CBC      Component Value Range   WBC 6.2  4.0 - 10.5 (K/uL)   RBC 4.61  4.22 - 5.81 (MIL/uL)   Hemoglobin 11.4 (*) 13.0 - 17.0 (g/dL)   HCT 04.5  40.9 - 81.1 (%)   MCV 85.5  78.0 - 100.0 (fL)   MCH 24.7 (*) 26.0 - 34.0 (pg)   MCHC 28.9 (*) 30.0 - 36.0 (g/dL)   RDW 16.1 (*) 09.6 - 15.5 (%)   Platelets 304  150 - 400 (K/uL)  DIFFERENTIAL      Component Value Range   Neutrophils Relative 68  43 - 77 (%)   Neutro Abs 4.2  1.7 - 7.7 (K/uL)   Lymphocytes Relative 20  12 - 46 (%)   Lymphs Abs 1.2  0.7 - 4.0 (K/uL)   Monocytes Relative 10  3 - 12 (%)   Monocytes Absolute 0.6  0.1 - 1.0 (K/uL)   Eosinophils Relative 2  0 - 5 (%)   Eosinophils Absolute 0.1  0.0 - 0.7 (K/uL)   Basophils Relative 1  0 - 1 (%)   Basophils Absolute 0.0  0.0 - 0.1 (K/uL)  POCT I-STAT TROPONIN I      Component Value Range   Troponin i, poc 0.00  0.00 - 0.08 (ng/mL)   Comment 3             Dg Chest Port 1 View  11/14/2011  *RADIOLOGY REPORT*  Clinical Data: Rule out infiltrate.  Shortness of breath and weakness.  PORTABLE CHEST - 1 VIEW  Comparison: None  Findings: There is rotational artifact.  The heart size appears normal.  No pleural effusion or pulmonary edema identified.  Asymmetric indeterminant opacity within the right upper lobe is partially obscured by overlying mediastinal structures.  IMPRESSION:  1.  Exam detail diminish due to rotational artifact. 2.   Indeterminant opacity in the right upper lobe which may represent an infiltrate.  Suggest repeat imaging with upright PA and lateral chest radiograph.  Original Report Authenticated By: Rosealee Albee, M.D.     3:54 PM:  Cr mildly elevated, no old to compare.  Continuous neb near finishing, lungs continue coarse and diminished bilat.  Sats 98-99% while on neb.  Needs repeat CXR and walk with pulse ox on R/A.  Likely needs admit.  Sign out to Dr. Ignacia Palma.     I personally performed the services described in this documentation, which was scribed in my presence. The recorded information has been reviewed and considered.  Jeovany Huitron Allison Quarry, DO 11/14/11 1555

## 2011-11-14 NOTE — ED Provider Notes (Signed)
4:01 PM Pt's care assumed from Dr. Clarene Duke.  He is having difficulty breathing and cough.  He is currently receiving a one-hour continuous albuterol nebulizer treatment.  After that he will go for a 2 view chest x-ray to check for pneumonia.    5:35 PM Chest x-ray showed COPD, no pneumonia.  Had pt walk in ED, and he desaturates into the 80's.  Will request admission for COPD exacerbation.  6:08 PM Case discussed with Elliot Cousin, M.D.  Admit to a telemetry floor.  Rx with IV Avelox and solumedrol.     Carleene Cooper III, MD 11/14/11 [phone]

## 2011-11-15 ENCOUNTER — Encounter (HOSPITAL_COMMUNITY): Payer: Self-pay | Admitting: Internal Medicine

## 2011-11-15 ENCOUNTER — Inpatient Hospital Stay (HOSPITAL_COMMUNITY): Payer: Medicare Other

## 2011-11-15 DIAGNOSIS — N401 Enlarged prostate with lower urinary tract symptoms: Secondary | ICD-10-CM | POA: Diagnosis present

## 2011-11-15 LAB — COMPREHENSIVE METABOLIC PANEL
AST: 13 U/L (ref 0–37)
BUN: 19 mg/dL (ref 6–23)
CO2: 24 mEq/L (ref 19–32)
Calcium: 10.4 mg/dL (ref 8.4–10.5)
Creatinine, Ser: 1.42 mg/dL — ABNORMAL HIGH (ref 0.50–1.35)
GFR calc non Af Amer: 45 mL/min — ABNORMAL LOW (ref >90)
Total Bilirubin: 0.3 mg/dL (ref 0.3–1.2)

## 2011-11-15 LAB — GLUCOSE, CAPILLARY: Glucose-Capillary: 342 mg/dL — ABNORMAL HIGH (ref 70–99)

## 2011-11-15 LAB — CBC
Hemoglobin: 10.6 g/dL — ABNORMAL LOW (ref 13.0–17.0)
MCH: 24.6 pg — ABNORMAL LOW (ref 26.0–34.0)
Platelets: 335 10*3/uL (ref 150–400)
RBC: 4.31 MIL/uL (ref 4.22–5.81)
WBC: 4.5 10*3/uL (ref 4.0–10.5)

## 2011-11-15 LAB — HEMOGLOBIN A1C
Hgb A1c MFr Bld: 7.2 % — ABNORMAL HIGH (ref <5.7)
Mean Plasma Glucose: 160 mg/dL — ABNORMAL HIGH (ref <117)

## 2011-11-15 LAB — T4, FREE: Free T4: 1.2 ng/dL (ref 0.80–1.80)

## 2011-11-15 LAB — URINALYSIS, ROUTINE W REFLEX MICROSCOPIC
Leukocytes, UA: NEGATIVE
Nitrite: NEGATIVE
Protein, ur: NEGATIVE mg/dL
Specific Gravity, Urine: 1.01 (ref 1.005–1.030)
Urobilinogen, UA: 0.2 mg/dL (ref 0.0–1.0)

## 2011-11-15 LAB — IRON AND TIBC
Iron: 25 ug/dL — ABNORMAL LOW (ref 42–135)
Saturation Ratios: 6 % — ABNORMAL LOW (ref 20–55)
TIBC: 386 ug/dL (ref 215–435)
UIBC: 361 ug/dL (ref 125–400)

## 2011-11-15 LAB — URINE MICROSCOPIC-ADD ON

## 2011-11-15 LAB — FERRITIN: Ferritin: 28 ng/mL (ref 22–322)

## 2011-11-15 MED ORDER — POTASSIUM CHLORIDE IN NACL 20-0.9 MEQ/L-% IV SOLN
INTRAVENOUS | Status: DC
  Administered 2011-11-15 – 2011-11-17 (×2): via INTRAVENOUS

## 2011-11-15 MED ORDER — INSULIN GLARGINE 100 UNIT/ML ~~LOC~~ SOLN
15.0000 [IU] | Freq: Two times a day (BID) | SUBCUTANEOUS | Status: DC
  Administered 2011-11-15 – 2011-11-18 (×7): 15 [IU] via SUBCUTANEOUS
  Filled 2011-11-15: qty 3

## 2011-11-15 MED ORDER — FAMOTIDINE 20 MG PO TABS
20.0000 mg | ORAL_TABLET | Freq: Every day | ORAL | Status: DC
  Administered 2011-11-15 – 2011-11-18 (×4): 20 mg via ORAL
  Filled 2011-11-15 (×4): qty 1

## 2011-11-15 NOTE — Progress Notes (Signed)
Abdomen distended, urine output 50 cc via urinal, bladder scanned for 800 cc, 750 cc, attempted in/out cath, unsuccessful catheter would not advance via penis, very painful per patient. Dr. Evelena Asa made aware new order for urology consult asap

## 2011-11-15 NOTE — Progress Notes (Signed)
UR Chart Review Completed  

## 2011-11-15 NOTE — Progress Notes (Signed)
Pt was seen this AM in attempt of eval.  He reports that he has no mobility problems...[place].  He is now most concerned about his urinary retention.  He declines PT eval at this time.  If status changes, please reconsult.  Thank you.

## 2011-11-15 NOTE — Progress Notes (Signed)
Subjective: The patient says he is breathing about the same. He complains more of bladder discomfort currently.  Objective: Vital signs in last 24 hours: Filed Vitals:   11/15/11 0044 11/15/11 0451 11/15/11 0528 11/15/11 0811  BP:   159/91   Pulse:   80 106  Temp:   98 F (36.7 C)   TempSrc:   Oral   Resp:   22 22  Height:      Weight:      SpO2: 97% 97%      Intake/Output Summary (Last 24 hours) at 11/15/11 1006 Last data filed at 11/15/11 0951  Gross per 24 hour  Intake    680 ml  Output    430 ml  Net    250 ml    Weight change:   Physical exam: General: 77 year-old after American man who appears uncomfortable. Lungs: Decreased breath sounds in the bases and faint wheezes in the upper lobes. Ex line heart: S1, S2, with borderline tachycardia. Abdomen: Distended bladder, moderately tender, positive bowel sounds, no masses palpated. Extremities: No pedal edema.   Lab Results: Basic Metabolic Panel:  Basename 11/15/11 0449 11/14/11 1356  NA 138 142  K 4.8 4.1  CL 103 105  CO2 24 28  GLUCOSE 332* 160*  BUN 19 14  CREATININE 1.42* 1.38*  CALCIUM 10.4 10.3  MG -- --  PHOS -- --   Liver Function Tests:  [facility] 11/15/11 0449  AST 13  ALT 8  ALKPHOS 67  BILITOT 0.3  PROT 8.0  ALBUMIN 3.3*   No results found for this basename: LIPASE:2,AMYLASE:2 in the last 72 hours No results found for this basename: AMMONIA:2 in the last 72 hours CBC:  Basename 11/15/11 0449 11/14/11 1356  WBC 4.5 6.2  NEUTROABS -- 4.2  HGB 10.6* 11.4*  HCT 36.6* 39.4  MCV 84.9 85.5  PLT 335 304   Cardiac Enzymes: No results found for this basename: CKTOTAL:3,CKMB:3,CKMBINDEX:3,TROPONINI:3 in the last 72 hours BNP: No components found with this basename: POCBNP:3 D-Dimer: No results found for this basename: DDIMER:2 in the last 72 hours CBG:  Basename 11/15/11 0747 11/14/11 2149  GLUCAP 262* 342*   Hemoglobin A1C: No results found for this basename: HGBA1C in the last  72 hours Fasting Lipid Panel: No results found for this basename: CHOL,HDL,LDLCALC,TRIG,CHOLHDL,LDLDIRECT in the last 72 hours Thyroid Function Tests: No results found for this basename: TSH,T4TOTAL,FREET4,T3FREE,THYROIDAB in the last 72 hours Anemia Panel: No results found for this basename: VITAMINB12,FOLATE,FERRITIN,TIBC,IRON,RETICCTPCT in the last 72 hours Coagulation: No results found for this basename: LABPROT:2,INR:2 in the last 72 hours Urine Drug Screen: Drugs of Abuse  No results found for this basename: labopia, cocainscrnur, labbenz, amphetmu, thcu, labbarb    Alcohol Level: No results found for this basename: ETH:2 in the last 72 hours    Micro: No results found for this or any previous visit (from the past 240 hour(s)).  Studies/Results: Dg Chest 2 View  11/14/2011  *RADIOLOGY REPORT*  Clinical Data: Shortness of breath, COPD.  CHEST - 2 VIEW  Comparison: 11/14/2011  Findings: There is hyperinflation of the lungs compatible with COPD.  Scarring noted in the right medial upper lobe.  Heart is upper limits normal in size.  No acute opacities or effusions.  No acute bony abnormality.  IMPRESSION: COPD/chronic changes.  No active disease.  Original Report Authenticated By: Cyndie Chime, M.D.   Dg Chest Port 1 View  11/14/2011  *RADIOLOGY REPORT*  Clinical Data: Rule out infiltrate.  Shortness of breath and weakness.  PORTABLE CHEST - 1 VIEW  Comparison: None  Findings: There is rotational artifact.  The heart size appears normal.  No pleural effusion or pulmonary edema identified.  Asymmetric indeterminant opacity within the right upper lobe is partially obscured by overlying mediastinal structures.  IMPRESSION:  1.  Exam detail diminish due to rotational artifact. 2.  Indeterminant opacity in the right upper lobe which may represent an infiltrate.  Suggest repeat imaging with upright PA and lateral chest radiograph.  Original Report Authenticated By: Rosealee Albee, M.D.     Medications: I have reviewed the patient's current medications.  Assessment: Principal Problem:  *COPD with exacerbation Active Problems:  Acute renal failure  Anemia  DM type 2 (diabetes mellitus, type 2)  History of lung cancer  Urinary retention  1. COPD with acute bronchitic exacerbation. He is on IV steroids, IV Avelox, and bronchodilators with albuterol Atrovent.  Urinary retention in a patient with history of BPH. Urology has been consulted.  Acute renal failure or possibly underlying chronic kidney disease.  Type 2 diabetes mellitus, uncontrolled in part because of IV steroids.  Anemia with a history of colon polyps per colonoscopy in October of 2012.  Plan:  1. Continue treatment for COPD. Urologist Dr. Jerre Simon has been consulted. We'll order a renal ultrasound and urinalysis for further evaluation. Add twice a day Lantus to sliding scale NovoLog. Start gentle IV fluids. Start prophylactic H2 blocker while he is on steroids. We'll order a TSH, free T4, A1c, etc.   LOS: 1 day   Brendan Hall 11/15/2011, 10:06 AM

## 2011-11-15 NOTE — Progress Notes (Signed)
Dr. Jerre Simon notified of pt having urine frequency, urgency, and retention.  AEB urine 800cc per bladder scanner according to the night report.  Reported to him that the patient is a new consult for him.  He stated he would be in to see the patient.

## 2011-11-15 NOTE — Progress Notes (Signed)
Text paged Dr. Sherrie Mustache the Renal U/S report results. Will continue to monitor.

## 2011-11-15 NOTE — Plan of Care (Signed)
Problem: Phase I Progression Outcomes Goal: Pain controlled with appropriate interventions Outcome: Progressing Abdominal discomfort improved after the foley was placed. Goal: OOB as tolerated unless otherwise ordered Outcome: Progressing Pt dangles from the side of bed Goal: Voiding-avoid urinary catheter unless indicated Outcome: Not Progressing Pt has urine retention and the foley had to be placed.

## 2011-11-15 NOTE — Consult Note (Signed)
[phone]

## 2011-11-15 NOTE — Progress Notes (Signed)
Dr. Jerre Simon placed a 20 french foley to the patients bladder.  The patients urine output was approximately 650cc of dark yellow urine. After the the foley was placed the patient stated that he felt better and had relief.  Will continue to monitor.  Sticky note left for nursing to leave the foley in.

## 2011-11-16 ENCOUNTER — Encounter (HOSPITAL_COMMUNITY): Payer: Self-pay | Admitting: Internal Medicine

## 2011-11-16 DIAGNOSIS — N2889 Other specified disorders of kidney and ureter: Secondary | ICD-10-CM | POA: Diagnosis present

## 2011-11-16 LAB — BASIC METABOLIC PANEL
BUN: 24 mg/dL — ABNORMAL HIGH (ref 6–23)
Calcium: 9.8 mg/dL (ref 8.4–10.5)
Chloride: 107 mEq/L (ref 96–112)
Creatinine, Ser: 1.34 mg/dL (ref 0.50–1.35)
GFR calc Af Amer: 56 mL/min — ABNORMAL LOW (ref >90)
GFR calc non Af Amer: 49 mL/min — ABNORMAL LOW (ref >90)
Potassium: 4.5 mEq/L (ref 3.5–5.1)
Sodium: 141 mEq/L (ref 135–145)

## 2011-11-16 LAB — CBC
Hemoglobin: 10.6 g/dL — ABNORMAL LOW (ref 13.0–17.0)
RBC: 4.08 MIL/uL — ABNORMAL LOW (ref 4.22–5.81)
WBC: 10.4 10*3/uL (ref 4.0–10.5)

## 2011-11-16 LAB — GLUCOSE, CAPILLARY: Glucose-Capillary: 274 mg/dL — ABNORMAL HIGH (ref 70–99)

## 2011-11-16 MED ORDER — INSULIN ASPART 100 UNIT/ML ~~LOC~~ SOLN
3.0000 [IU] | Freq: Three times a day (TID) | SUBCUTANEOUS | Status: DC
  Administered 2011-11-17 – 2011-11-18 (×5): 3 [IU] via SUBCUTANEOUS

## 2011-11-16 NOTE — Progress Notes (Signed)
Subjective: The patient says that he is still feeling tightness in his chest. He is coughing less however. He feels more relieved now that the Foley catheter is in place. He has less bladder tenderness. He has no complaints of right flank pain.  Objective: Vital signs in last 24 hours: Filed Vitals:   11/16/11 0751 11/16/11 1133 11/16/11 1400 11/16/11 1652  BP:   118/74   Pulse:   103   Temp:   97.3 F (36.3 C)   TempSrc:      Resp:   20   Height:      Weight:      SpO2: 98% 96% 98% 98%    Intake/Output Summary (Last 24 hours) at 11/16/11 1801 Last data filed at 11/16/11 1500  Gross per 24 hour  Intake   1684 ml  Output    850 ml  Net    834 ml    Weight change: -0.454 kg (-1 lb)  Physical exam: General: 77 year-old after American man who appears less uncomfortable. Lungs: Decreased breath sounds in the bases and faint wheezes/crackles bilaterally in the mid lobes. Breathing is slightly labored. Abdomen: Positive bowel sounds, significantly less bladder distention, soft, mild suprapubic tenderness, no flank/CVA tenderness. No masses palpated. Extremities: No pedal edema.   Lab Results: Basic Metabolic Panel:  Basename 11/16/11 0705 11/15/11 0449  NA 141 138  K 4.5 4.8  CL 107 103  CO2 27 24  GLUCOSE 167* 332*  BUN 24* 19  CREATININE 1.34 1.42*  CALCIUM 9.8 10.4  MG -- --  PHOS -- --   Liver Function Tests:  [facility] 11/15/11 0449  AST 13  ALT 8  ALKPHOS 67  BILITOT 0.3  PROT 8.0  ALBUMIN 3.3*   No results found for this basename: LIPASE:2,AMYLASE:2 in the last 72 hours No results found for this basename: AMMONIA:2 in the last 72 hours CBC:  Basename 11/16/11 0705 11/15/11 0449 11/14/11 1356  WBC 10.4 4.5 --  NEUTROABS -- -- 4.2  HGB 10.6* 10.6* --  HCT 34.7* 36.6* --  MCV 85.0 84.9 --  PLT 333 335 --   Cardiac Enzymes: No results found for this basename: CKTOTAL:3,CKMB:3,CKMBINDEX:3,TROPONINI:3 in the last 72 hours BNP: No components found  with this basename: POCBNP:3 D-Dimer: No results found for this basename: DDIMER:2 in the last 72 hours CBG:  Basename 11/16/11 1654 11/15/11 2114 11/15/11 1619 11/15/11 1249 11/15/11 0747 11/14/11 2149  GLUCAP 274* 254* 306* 71 262* 342*   Hemoglobin A1C:  Basename 11/14/11 2030  HGBA1C 7.2*   Fasting Lipid Panel: No results found for this basename: CHOL,HDL,LDLCALC,TRIG,CHOLHDL,LDLDIRECT in the last 72 hours Thyroid Function Tests:  Basename 11/15/11 0445  TSH 0.795  T4TOTAL --  FREET4 1.20  T3FREE --  THYROIDAB --   Anemia Panel:  Basename 11/15/11 0445  VITAMINB12 308  FOLATE --  FERRITIN 28  TIBC 386  IRON 25*  RETICCTPCT --   Coagulation: No results found for this basename: LABPROT:2,INR:2 in the last 72 hours Urine Drug Screen: Drugs of Abuse  No results found for this basename: labopia,  cocainscrnur,  labbenz,  amphetmu,  thcu,  labbarb    Alcohol Level: No results found for this basename: ETH:2 in the last 72 hours    Micro: No results found for this or any previous visit (from the past 240 hour(s)).  Studies/Results: US Renal  11/15/2011  *RADIOLOGY REPORT*  Clinical Data: Urinary retention  RENAL/URINARY TRACT ULTRASOUND COMPLETE  Comparison:  None.  Findings:  Right Kidney:  9.8 cm in length.  Negative for obstruction. Complex cystic mass right upper pole with multiple internal septations.  This mass measures 3.7 x 5.2 x 2.7 cm.  Left Kidney:  9.0 cm.  Negative for obstruction.  19 x 21 mm simple cyst left mid pole.  Bladder:  Decompressed bladder with a Foley catheter.  IMPRESSION: Multi septated cystic mass right kidney.  Given the sonographic indeterminate features, further cross sectional imaging is suggested to rule out a cystic neoplasm.  MRI with contrast would be most useful test.  If the patient cannot have MRI, CT without and with contrast could be performed.  If these are not options, follow-up ultrasound could be performed in 6 months.   Original Report Authenticated By: Camelia Phenes, M.D.    Medications: I have reviewed the patient's current medications.  Assessment: Principal Problem:  *COPD with exacerbation Active Problems:  Acute renal failure  Anemia  DM type 2 (diabetes mellitus, type 2)  History of lung cancer  Urinary retention  Renal mass, right  1. COPD with acute bronchitic exacerbation. He is on IV steroids, IV Avelox, and bronchodilators with albuterol Atrovent. He is still somewhat symptomatic but stable.  Urinary retention in a patient with history of BPH. His urinalysis is negative for WBCs and RBCs. He is on Flomax. He is status post Foley catheter insertion by Dr. Jerre Simon. Dr. Rudean Haskell assessment and recommendations are noted and appreciated.  Right renal cystic mass. This will be further evaluated with an MRI.  Acute renal failure. Resolved with IV fluid hydration.  Type 2 diabetes mellitus, uncontrolled in part because of IV steroids, but overall better with the increase in insulin. We'll need to titrate the insulin more. His hemoglobin A1c is 7.2.  Anemia with a history of colon polyps per colonoscopy in October of 2012. His anemia panel is noted for mild iron deficiency. Should start iron supplementation at the time of discharge.  Plan:  1. Continue treatment for COPD. Will add mealtime coverage of NovoLog. We'll order MRI of his abdomen for Monday to evaluate the right renal cystic mass. If the patient is still in the hospital on Wednesday, it is likely that Dr. Jerre Simon will want to do a cystoscopy. If he is discharged before then, arrangements will need to be made for him to followup with Dr. Jerre Simon in his office.   LOS: 2 days   Brendan Hall 11/16/2011, 6:01 PM

## 2011-11-16 NOTE — Progress Notes (Signed)
[phone]

## 2011-11-16 NOTE — Plan of Care (Signed)
Problem: Phase I Progression Outcomes Goal: Voiding-avoid urinary catheter unless indicated Outcome: Not Progressing Foley catheter placed yesterday for urinary retention.  Catheter to remain in place at discharge for outpatient follow-up with Dr. Jerre Simon.

## 2011-11-16 NOTE — Consult Note (Signed)
NAMEKRUZE, ATCHLEY NO.:  [id number]  MEDICAL RECORD NO.:  [id number]  LOCATION:  A338                          FACILITY:  [facility]  PHYSICIAN:  Ky Barban, M.D.DATE OF BIRTH:  10-Apr-1930  DATE OF CONSULTATION: DATE OF DISCHARGE:                                CONSULTATION   CHIEF COMPLAINT:  Acute urinary retention.  HISTORY:  A 77 year old gentleman primarily admitted with exacerbation of COPD and multiple medical problems.  Last night, he was found to have abdominal distention and inability to urinate.  They were unable to put a catheter, so I was called in to see this patient.  He has history of prostatism and BPH for long time.  OTHER MEDICAL PROBLEMS:  Type 2 diabetes, history of COPD.  I see some of the record that are saying he has lung cancer, but it does not look like.  He has a history of anemia with history of colon polyps per colonoscopy in October 2012.  PHYSICAL EXAMINATION:  GENERAL:  Moderately built male, not in acute distress, fully conscious, alert, oriented. VITAL SIGNS:  Blood pressure is 159/91, pulse 106 per minute, temperature 98. ABDOMEN:  Soft and flat.  Liver, spleen, and kidneys not palpable. GU:  External genitalia is not circumcised.  Testicles are normal. RECTAL:  Deferred.  His bladder was distended, so I put a Foley catheter with the help of a stylet without any difficulty and removed 800 mL of urine.  Feels better.  LABORATORY DATA:  Sodium 142, potassium 4.1, chloride 103, CO2 is 24,. Creatinine 1.42, BUN is 19.  WBC count is 4.5, hematocrit 36.6.  IMPRESSION:  Acute urinary retention, probably benign prostatic hypertrophy.  Recommend continue Foley catheter drainage and he will need to be scheduled for cystoscopy after the holidays.  If he goes home, then I can schedule it in the office.  I will like to see him in the office, so we can do cysto in the office to find what went wrong. He is already scheduled to  have a renal ultrasound.  I will follow him while he is in the hospital.     Ky Barban, M.D.    MIJ/MEDQ  D:  11/15/2011  T:  11/16/2011  Job:  [id number]

## 2011-11-17 DIAGNOSIS — J209 Acute bronchitis, unspecified: Secondary | ICD-10-CM | POA: Diagnosis present

## 2011-11-17 LAB — GLUCOSE, CAPILLARY: Glucose-Capillary: 204 mg/dL — ABNORMAL HIGH (ref 70–99)

## 2011-11-17 LAB — URINE CULTURE: Culture: NO GROWTH

## 2011-11-17 MED ORDER — MOXIFLOXACIN HCL IN NACL 400 MG/250ML IV SOLN
400.0000 mg | INTRAVENOUS | Status: DC
  Filled 2011-11-17 (×2): qty 250

## 2011-11-17 MED ORDER — IPRATROPIUM BROMIDE 0.02 % IN SOLN
0.5000 mg | Freq: Four times a day (QID) | RESPIRATORY_TRACT | Status: DC
  Administered 2011-11-17 – 2011-11-18 (×5): 0.5 mg via RESPIRATORY_TRACT
  Filled 2011-11-17 (×5): qty 2.5

## 2011-11-17 MED ORDER — ALBUTEROL SULFATE (5 MG/ML) 0.5% IN NEBU
2.5000 mg | INHALATION_SOLUTION | Freq: Four times a day (QID) | RESPIRATORY_TRACT | Status: DC
  Administered 2011-11-17 – 2011-11-18 (×5): 2.5 mg via RESPIRATORY_TRACT
  Filled 2011-11-17 (×5): qty 0.5

## 2011-11-17 MED ORDER — PREDNISONE 20 MG PO TABS
60.0000 mg | ORAL_TABLET | Freq: Two times a day (BID) | ORAL | Status: DC
  Administered 2011-11-17 – 2011-11-18 (×2): 60 mg via ORAL
  Filled 2011-11-17 (×2): qty 3

## 2011-11-17 MED ORDER — MOXIFLOXACIN HCL 400 MG PO TABS
400.0000 mg | ORAL_TABLET | Freq: Every day | ORAL | Status: DC
  Administered 2011-11-17: 400 mg via ORAL
  Filled 2011-11-17: qty 1

## 2011-11-17 NOTE — Progress Notes (Signed)
Chart reviewed.  Subjective: Still slightly short of breath, but improved. Has not ambulated much. Appetite is good.  Objective: Vital signs in last 24 hours: Filed Vitals:   11/17/11 0005 11/17/11 0442 11/17/11 0544 11/17/11 0732  BP:   147/75   Pulse:   90   Temp:   98 F (36.7 C)   TempSrc:   Oral   Resp:   20   Height:      Weight:      SpO2: 96% 98% 97% 98%   Weight change:   Intake/Output Summary (Last 24 hours) at 11/17/11 0952 Last data filed at 11/17/11 0600  Gross per 24 hour  Intake   2931 ml  Output   1325 ml  Net   1606 ml   Physical Exam: General: Comfortable. Watching TV. Lungs: Diminished throughout without wheezes rhonchi or rales. Cardiovascular regular rate rhythm without murmurs gallops rubs Abdomen soft nontender nondistended GU Foley catheter draining clear yellow urine Extremities no clubbing cyanosis or edema  Lab Results: Basic Metabolic Panel:  Lab 11/16/11 4098 11/15/11 0449  NA 141 138  K 4.5 4.8  CL 107 103  CO2 27 24  GLUCOSE 167* 332*  BUN 24* 19  CREATININE 1.34 1.42*  CALCIUM 9.8 10.4  MG -- --  PHOS -- --   Liver Function Tests:  Lab 11/15/11 0449  AST 13  ALT 8  ALKPHOS 67  BILITOT 0.3  PROT 8.0  ALBUMIN 3.3*   No results found for this basename: LIPASE:2,AMYLASE:2 in the last 168 hours No results found for this basename: AMMONIA:2 in the last 168 hours CBC:  Lab 11/16/11 0705 11/15/11 0449 11/14/11 1356  WBC 10.4 4.5 --  NEUTROABS -- -- 4.2  HGB 10.6* 10.6* --  HCT 34.7* 36.6* --  MCV 85.0 84.9 --  PLT 333 335 --   Cardiac Enzymes: No results found for this basename: CKTOTAL:3,CKMB:3,CKMBINDEX:3,TROPONINI:3 in the last 168 hours BNP: No components found with this basename: POCBNP:3 D-Dimer: No results found for this basename: DDIMER:2 in the last 168 hours CBG:  Lab 11/17/11 0744 11/16/11 1654 11/15/11 2114 11/15/11 1619 11/15/11 1249 11/15/11 0747  GLUCAP 213* 274* 254* 306* 71 262*   Hemoglobin  A1C:  Lab 11/14/11 2030  HGBA1C 7.2*   Fasting Lipid Panel: No results found for this basename: CHOL,HDL,LDLCALC,TRIG,CHOLHDL,LDLDIRECT in the last 119 hours Thyroid Function Tests:  Lab 11/15/11 0445  TSH 0.795  T4TOTAL --  FREET4 1.20  T3FREE --  THYROIDAB --   Coagulation: No results found for this basename: LABPROT:4,INR:4 in the last 168 hours Anemia Panel:  Lab 11/15/11 0445  VITAMINB12 308  FOLATE --  FERRITIN 28  TIBC 386  IRON 25*  RETICCTPCT --    Micro Results: Recent Results (from the past 240 hour(s))  URINE CULTURE     Status: Normal   Collection Time   11/15/11 11:52 AM      Component Value Range Status Comment   Specimen Description URINE, CLEAN CATCH   Final    Special Requests NONE   Final    Setup Time [id number]   Final    Colony Count NO GROWTH   Final    Culture NO GROWTH   Final    Report Status 11/17/2011 FINAL   Final    Studies/Results: US Renal  11/15/2011  *RADIOLOGY REPORT*  Clinical Data: Urinary retention  RENAL/URINARY TRACT ULTRASOUND COMPLETE  Comparison:  None.  Findings:  Right Kidney:  9.8 cm in length.  Negative for obstruction. Complex cystic mass right upper pole with multiple internal septations.  This mass measures 3.7 x 5.2 x 2.7 cm.  Left Kidney:  9.0 cm.  Negative for obstruction.  19 x 21 mm simple cyst left mid pole.  Bladder:  Decompressed bladder with a Foley catheter.  IMPRESSION: Multi septated cystic mass right kidney.  Given the sonographic indeterminate features, further cross sectional imaging is suggested to rule out a cystic neoplasm.  MRI with contrast would be most useful test.  If the patient cannot have MRI, CT without and with contrast could be performed.  If these are not options, follow-up ultrasound could be performed in 6 months.  Original Report Authenticated By: Camelia Phenes, M.D.   Scheduled Meds:   . albuterol  2.5 mg Nebulization QID  . antiseptic oral rinse  15 mL Mouth Rinse BID  . aspirin  EC  81 mg Oral Daily  . docusate sodium  100 mg Oral BID  . famotidine  20 mg Oral Daily  . Fluticasone-Salmeterol  1 puff Inhalation Q12H  . furosemide  40 mg Oral Daily  . heparin  5,000 Units Subcutaneous Q8H  . insulin aspart  0-20 Units Subcutaneous TID WC  . insulin aspart  0-5 Units Subcutaneous QHS  . insulin aspart  3 Units Subcutaneous TID WC  . insulin glargine  15 Units Subcutaneous BID  . ipratropium  0.5 mg Nebulization QID  . moxifloxacin  400 mg Intravenous Q24H  . predniSONE  60 mg Oral BID  . simvastatin  40 mg Oral QHS  . sodium chloride  3 mL Intravenous Q12H  . Tamsulosin HCl  0.4 mg Oral Daily  . DISCONTD: albuterol  2.5 mg Nebulization Q4H  . DISCONTD: ipratropium  0.5 mg Nebulization Q4H  . DISCONTD: methylPREDNISolone (SOLU-MEDROL) injection  60 mg Intravenous Q6H  . DISCONTD: moxifloxacin  400 mg Intravenous Q24H   Continuous Infusions:   . DISCONTD: 0.9 % NaCl with KCl 20 mEq / L 60 mL/hr at 11/17/11 0130   PRN Meds:.acetaminophen, albuterol, morphine, ondansetron (ZOFRAN) IV, ondansetron, DISCONTD: acetaminophen Assessment/Plan: Principal Problem:  *COPD with exacerbation Active Problems:  DM type 2 (diabetes mellitus, type 2)  History of lung cancer  Urinary retention  Renal mass, right  Acute renal failure  Anemia  Hypercholesteremia  Hep-Lock IV. Change steroids and antibiotics to by mouth. Increase activity. Patient reports that he wears oxygen at night. Check room air saturation during the day. Await MRI scan. Continue Foley catheter. Eventual cystoscopy.   LOS: 3 days   Kanyah Matsushima L 11/17/2011, 9:52 AM

## 2011-11-17 NOTE — Progress Notes (Signed)
NAME:  MYLEZ, VENABLE NO.:  [id number]  MEDICAL RECORD NO.:  [id number]  LOCATION:  A338                          FACILITY:  [facility]  PHYSICIAN:  Ky Barban, M.D.DATE OF BIRTH:  Jan 26, 1930  DATE OF PROCEDURE: DATE OF DISCHARGE:                                PROGRESS NOTE   Mr. Karbowski is here primarily because of exacerbation of COPD, went into urinary retention following that and we have inserted a Foley catheter.  He is doing fine from urinary retention problem and he has a routine renal ultrasound done which shows cystic complicated mass in the right kidney and it needs further workup, so Dr. Sherrie Mustache is going to go ahead and schedule MRI with contrast.  I will follow him.  If he is here by Wednesday, then I can cystoscope him on Wednesday, otherwise he will go home with a Foley catheter.  I have discussed with the patient, his physician, and the family.     Ky Barban, M.D.     MIJ/MEDQ  D:  11/16/2011  T:  11/17/2011  Job:  [id number]

## 2011-11-18 ENCOUNTER — Encounter (HOSPITAL_COMMUNITY): Payer: Self-pay | Admitting: Radiology

## 2011-11-18 ENCOUNTER — Inpatient Hospital Stay (HOSPITAL_COMMUNITY): Payer: Medicare Other

## 2011-11-18 DIAGNOSIS — N281 Cyst of kidney, acquired: Secondary | ICD-10-CM | POA: Diagnosis present

## 2011-11-18 LAB — GLUCOSE, CAPILLARY
Glucose-Capillary: 191 mg/dL — ABNORMAL HIGH (ref 70–99)
Glucose-Capillary: 211 mg/dL — ABNORMAL HIGH (ref 70–99)

## 2011-11-18 MED ORDER — IOHEXOL 300 MG/ML  SOLN
100.0000 mL | Freq: Once | INTRAMUSCULAR | Status: AC | PRN
  Administered 2011-11-18: 100 mL via INTRAVENOUS

## 2011-11-18 MED ORDER — PREDNISONE (PAK) 10 MG PO TABS: 10.0000 mg | ORAL_TABLET | Freq: Every day | ORAL | Status: AC

## 2011-11-18 MED ORDER — MOXIFLOXACIN HCL 400 MG PO TABS: 400.0000 mg | ORAL_TABLET | Freq: Every day | ORAL | Status: AC

## 2011-11-18 NOTE — Discharge Summary (Signed)
Physician Discharge Summary  Patient ID: Brendan Hall MRN: [id number] DOB/AGE: January 01, 1930 77 y.o.  Admit date: 11/14/2011 Discharge date: 11/18/2011  Discharge Diagnoses:  Principal Problem:  *COPD with exacerbation Active Problems:  Acute bronchitis  DM type 2 (diabetes mellitus, type 2)  History of lung cancer  Urinary retention  Acute renal failure  Anemia  Hypercholesteremia  Renal cysts, acquired, bilateral   Current Discharge Medication List    START taking these medications   Details  moxifloxacin (AVELOX) 400 MG tablet Take 1 tablet (400 mg total) by mouth daily at 6 PM. Qty: 4 tablet, Refills: 0    predniSONE (STERAPRED UNI-PAK) 10 MG tablet Take 1 tablet (10 mg total) by mouth daily. 4 tablets daily then decrease by 1 tablet every 2 days until off Qty: 20 tablet, Refills: 0      CONTINUE these medications which have NOT CHANGED   Details  albuterol (PROVENTIL HFA;VENTOLIN HFA) 108 (90 BASE) MCG/ACT inhaler Inhale 2 puffs into the lungs every 6 (six) hours as needed. For shortness of breath     aspirin EC 81 MG tablet Take 81 mg by mouth daily.      doxycycline (VIBRA-TABS) 100 MG tablet Take 100 mg by mouth 2 (two) times daily. Take 1 tablet twice daily for 14 days, started 11/12/11     Fluticasone-Salmeterol (ADVAIR) 250-50 MCG/DOSE AEPB Inhale 1 puff into the lungs every 12 (twelve) hours.      furosemide (LASIX) 40 MG tablet Take 40 mg by mouth daily.      glyBURIDE-metformin (GLUCOVANCE) 5-500 MG per tablet Take 1 tablet by mouth 3 (three) times daily.      simvastatin (ZOCOR) 40 MG tablet Take 40 mg by mouth at bedtime.      Tamsulosin HCl (FLOMAX) 0.4 MG CAPS Take 0.4 mg by mouth daily.          Discharge Orders    Future Orders Please Complete By Expires   Diet Carb Modified      Increase activity slowly      Call MD for:  difficulty breathing, headache or visual disturbances      Call MD for:  temperature >100.4         Follow-up  Information    Follow up with QURESHI,AYYAZ.      Follow up with JAVAID,MOHAMMAD I. (1 week after d/c date)    Contact information:   [address] [place] [address] [phone]          Disposition: Home or Self Care with home health  Discharged Condition: Stable  Consults: Treatment Team:  Ky Barban  Labs:   Results for orders placed during the hospital encounter of 11/14/11 (from the past 48 hour(s))  GLUCOSE, CAPILLARY     Status: Abnormal   Collection Time   11/16/11  4:54 PM      Component Value Range Comment   Glucose-Capillary 274 (*) 70 - 99 (mg/dL)   GLUCOSE, CAPILLARY     Status: Abnormal   Collection Time   11/17/11  7:44 AM      Component Value Range Comment   Glucose-Capillary 213 (*) 70 - 99 (mg/dL)   GLUCOSE, CAPILLARY     Status: Abnormal   Collection Time   11/17/11 10:56 AM      Component Value Range Comment   Glucose-Capillary 247 (*) 70 - 99 (mg/dL)   GLUCOSE, CAPILLARY     Status: Abnormal   Collection Time   11/17/11  4:53 PM      Component Value Range Comment   Glucose-Capillary 204 (*) 70 - 99 (mg/dL)   GLUCOSE, CAPILLARY     Status: Abnormal   Collection Time   11/18/11  7:27 AM      Component Value Range Comment   Glucose-Capillary 191 (*) 70 - 99 (mg/dL)   GLUCOSE, CAPILLARY     Status: Abnormal   Collection Time   11/18/11 11:49 AM      Component Value Range Comment   Glucose-Capillary 211 (*) 70 - 99 (mg/dL)     Diagnostics:  Dg Chest 2 View  11/14/2011  *RADIOLOGY REPORT*  Clinical Data: Shortness of breath, COPD.  CHEST - 2 VIEW  Comparison: 11/14/2011  Findings: There is hyperinflation of the lungs compatible with COPD.  Scarring noted in the right medial upper lobe.  Heart is upper limits normal in size.  No acute opacities or effusions.  No acute bony abnormality.  IMPRESSION: COPD/chronic changes.  No active disease.  Original Report Authenticated By: Cyndie Chime, M.D.   US  Renal  11/15/2011  *RADIOLOGY REPORT*  Clinical Data: Urinary retention  RENAL/URINARY TRACT ULTRASOUND COMPLETE  Comparison:  None.  Findings:  Right Kidney:  9.8 cm in length.  Negative for obstruction. Complex cystic mass right upper pole with multiple internal septations.  This mass measures 3.7 x 5.2 x 2.7 cm.  Left Kidney:  9.0 cm.  Negative for obstruction.  19 x 21 mm simple cyst left mid pole.  Bladder:  Decompressed bladder with a Foley catheter.  IMPRESSION: Multi septated cystic mass right kidney.  Given the sonographic indeterminate features, further cross sectional imaging is suggested to rule out a cystic neoplasm.  MRI with contrast would be most useful test.  If the patient cannot have MRI, CT without and with contrast could be performed.  If these are not options, follow-up ultrasound could be performed in 6 months.  Original Report Authenticated By: Camelia Phenes, M.D.   Ct Abd Wo & W Cm  11/18/2011  *RADIOLOGY REPORT*  Clinical Data: Complex cystic right renal mass seen on ultrasound. Urinary retention.  CT ABDOMEN WITHOUT AND WITH CONTRAST  Technique:  Multidetector CT imaging of the abdomen was performed following the standard protocol before and during bolus administration of intravenous contrast.  Contrast: OMNIPAQUE IOHEXOL 300 MG/ML IV SOLN  Comparison: Renal ultrasound on 11/15/2011  Findings: Several cysts are seen in the both kidneys, some of which are simple and others of which are hyperdense proteinaceous or hemorrhagic cysts.  The complex cysts seen in the posterior mid pole of the right kidney on ultrasound shows a few thin septations with both hyperdense and water attenuation components. These are consistent with benign Bosniak categories 1 and 2 cysts.  No thickened septations, mural nodules, or evidence of contrast enhancement is seen involving the lesions within either kidney. No evidence of renal calculi or hydronephrosis.  The other abdominal parenchymal organs are  normal in appearance. No evidence of abdominal lymphadenopathy or other soft tissue masses.  No evidence of inflammatory process or abnormal fluid collections.  IMPRESSION:  Bilateral benign Bosniak category 1 and 2 renal cysts.  No evidence of malignancy or other significant abnormality.  Original Report Authenticated By: Danae Orleans, M.D.   Dg Chest Port 1 View  11/14/2011  *RADIOLOGY REPORT*  Clinical Data: Rule out infiltrate.  Shortness of breath and weakness.  PORTABLE CHEST - 1 VIEW  Comparison: None  Findings: There is  rotational artifact.  The heart size appears normal.  No pleural effusion or pulmonary edema identified.  Asymmetric indeterminant opacity within the right upper lobe is partially obscured by overlying mediastinal structures.  IMPRESSION:  1.  Exam detail diminish due to rotational artifact. 2.  Indeterminant opacity in the right upper lobe which may represent an infiltrate.  Suggest repeat imaging with upright PA and lateral chest radiograph.  Original Report Authenticated By: Rosealee Albee, M.D.    EKG: Sinus tachycardia with 1st degree A-V block Septal infarct , age undetermined Abnormal ECG No previous ECGs available  Full Code   Hospital Course: See H&P for admission details.  The patient is a 77 year old black male with history of COPD lung cancer or diabetes who presents with a seven-day history of worsening shortness of breath. He has home oxygen which he mainly wears at night. He had a cough productive of brownish sputum. He was given doxycycline and bronchodilators prior to admission without much relief. In the emergency room, he was hypertensive, tachypneic, tachycardic. He had bilateral wheeze in the bases, bronchophony and diminished throughout. Chest x-ray showed COPD and chronic changes without any infiltrate or edema. White count was normal. Patient was started on steroids, bronchodilators, Avelox. By the time of discharge, his lungs were clear. He was able  to ambulate without difficulty in breathing was close to baseline.  During the hospitalization he had urinary retention. A Foley catheter could not be placed by nursing staff so Dr. Jesse Fall was consult and placed a Foley catheter. Patient will go home with a catheter. An ultrasound was done which showed a complex cyst. CT of the kidneys with and without contrast showed benign cystic structures of both kidneys but no evidence of neoplasm. Home health nursing will be arranged to assist with Foley catheter care and cardiopulmonary monitoring. He will followup with Dr. Jesse Fall next week who may do cystoscopy and decide about the Foley catheter. Patient was already on Flomax prior to admission. His other medical problems remain stable. Total time on the day of discharge is greater than 30 minutes.  Discharge Exam:  Blood pressure 130/78, pulse 83, temperature 97.2 F (36.2 C), temperature source Oral, resp. rate 20, height 5\' 7"  (1.702 m), weight 75.297 kg (166 lb), SpO2 97.00%.  Unchanged from 11/17/2011   Signed: Crista Curb L 11/18/2011, 1:14 PM

## 2011-11-18 NOTE — Progress Notes (Signed)
Pt d/c home with his wife. And with his foley catheter. [facility].

## 2011-11-18 NOTE — Progress Notes (Signed)
D/c instructions reviewed with patient and family.  Verbalized understanding. Pt dc'd to home with family.  Schonewitz, Candelaria Stagers 11/18/2011

## 2011-11-20 LAB — GLUCOSE, CAPILLARY: Glucose-Capillary: 208 mg/dL — ABNORMAL HIGH (ref 70–99)

## 2012-04-29 ENCOUNTER — Inpatient Hospital Stay (HOSPITAL_COMMUNITY)
Admission: EM | Admit: 2012-04-29 | Discharge: 2012-05-03 | DRG: 190 | Disposition: A | Discharge status: Home Health | Payer: Medicare Other | Attending: Internal Medicine | Admitting: Internal Medicine

## 2012-04-29 ENCOUNTER — Emergency Department (HOSPITAL_COMMUNITY): Payer: Medicare Other

## 2012-04-29 ENCOUNTER — Encounter (HOSPITAL_COMMUNITY): Payer: Self-pay

## 2012-04-29 DIAGNOSIS — Z923 Personal history of irradiation: Secondary | ICD-10-CM

## 2012-04-29 DIAGNOSIS — Z87891 Personal history of nicotine dependence: Secondary | ICD-10-CM

## 2012-04-29 DIAGNOSIS — Z7982 Long term (current) use of aspirin: Secondary | ICD-10-CM

## 2012-04-29 DIAGNOSIS — N401 Enlarged prostate with lower urinary tract symptoms: Secondary | ICD-10-CM | POA: Diagnosis present

## 2012-04-29 DIAGNOSIS — R339 Retention of urine, unspecified: Secondary | ICD-10-CM | POA: Diagnosis present

## 2012-04-29 DIAGNOSIS — E119 Type 2 diabetes mellitus without complications: Secondary | ICD-10-CM | POA: Diagnosis present

## 2012-04-29 DIAGNOSIS — Z860100 Personal history of colon polyps, unspecified: Secondary | ICD-10-CM

## 2012-04-29 DIAGNOSIS — Z79899 Other long term (current) drug therapy: Secondary | ICD-10-CM

## 2012-04-29 DIAGNOSIS — Z809 Family history of malignant neoplasm, unspecified: Secondary | ICD-10-CM

## 2012-04-29 DIAGNOSIS — J9600 Acute respiratory failure, unspecified whether with hypoxia or hypercapnia: Secondary | ICD-10-CM

## 2012-04-29 DIAGNOSIS — E785 Hyperlipidemia, unspecified: Secondary | ICD-10-CM | POA: Diagnosis present

## 2012-04-29 DIAGNOSIS — I4892 Unspecified atrial flutter: Secondary | ICD-10-CM

## 2012-04-29 DIAGNOSIS — N138 Other obstructive and reflux uropathy: Secondary | ICD-10-CM | POA: Diagnosis present

## 2012-04-29 DIAGNOSIS — IMO0001 Reserved for inherently not codable concepts without codable children: Secondary | ICD-10-CM | POA: Diagnosis present

## 2012-04-29 DIAGNOSIS — Z902 Acquired absence of lung [part of]: Secondary | ICD-10-CM

## 2012-04-29 DIAGNOSIS — N281 Cyst of kidney, acquired: Secondary | ICD-10-CM

## 2012-04-29 DIAGNOSIS — E538 Deficiency of other specified B group vitamins: Secondary | ICD-10-CM | POA: Diagnosis present

## 2012-04-29 DIAGNOSIS — Z9221 Personal history of antineoplastic chemotherapy: Secondary | ICD-10-CM

## 2012-04-29 DIAGNOSIS — I10 Essential (primary) hypertension: Secondary | ICD-10-CM | POA: Diagnosis present

## 2012-04-29 DIAGNOSIS — R112 Nausea with vomiting, unspecified: Secondary | ICD-10-CM

## 2012-04-29 DIAGNOSIS — N289 Disorder of kidney and ureter, unspecified: Secondary | ICD-10-CM | POA: Diagnosis not present

## 2012-04-29 DIAGNOSIS — E7800 Pure hypercholesterolemia, unspecified: Secondary | ICD-10-CM | POA: Diagnosis present

## 2012-04-29 DIAGNOSIS — Z9889 Other specified postprocedural states: Secondary | ICD-10-CM

## 2012-04-29 DIAGNOSIS — J209 Acute bronchitis, unspecified: Secondary | ICD-10-CM

## 2012-04-29 DIAGNOSIS — J9620 Acute and chronic respiratory failure, unspecified whether with hypoxia or hypercapnia: Secondary | ICD-10-CM | POA: Diagnosis present

## 2012-04-29 DIAGNOSIS — J441 Chronic obstructive pulmonary disease with (acute) exacerbation: Secondary | ICD-10-CM

## 2012-04-29 DIAGNOSIS — I252 Old myocardial infarction: Secondary | ICD-10-CM

## 2012-04-29 DIAGNOSIS — N179 Acute kidney failure, unspecified: Secondary | ICD-10-CM

## 2012-04-29 DIAGNOSIS — Z85118 Personal history of other malignant neoplasm of bronchus and lung: Secondary | ICD-10-CM

## 2012-04-29 DIAGNOSIS — Z9981 Dependence on supplemental oxygen: Secondary | ICD-10-CM

## 2012-04-29 DIAGNOSIS — D509 Iron deficiency anemia, unspecified: Secondary | ICD-10-CM | POA: Diagnosis present

## 2012-04-29 HISTORY — DX: Unspecified atrial flutter: I48.92

## 2012-04-29 HISTORY — DX: Iron deficiency anemia, unspecified: D50.9

## 2012-04-29 LAB — CBC
HCT: 36.3 % — ABNORMAL LOW (ref 39.0–52.0)
MCH: 25.2 pg — ABNORMAL LOW (ref 26.0–34.0)
MCHC: 29.8 g/dL — ABNORMAL LOW (ref 30.0–36.0)
MCV: 84.8 fL (ref 78.0–100.0)
Platelets: 316 10*3/uL (ref 150–400)
RDW: 15.5 % (ref 11.5–15.5)
WBC: 7.8 10*3/uL (ref 4.0–10.5)

## 2012-04-29 LAB — BLOOD GAS, ARTERIAL
Acid-Base Excess: 1.3 mmol/L (ref 0.0–2.0)
Bicarbonate: 26 mEq/L — ABNORMAL HIGH (ref 20.0–24.0)
Patient temperature: 37
TCO2: 2.8 mmol/L (ref 0–100)

## 2012-04-29 LAB — BASIC METABOLIC PANEL
BUN: 17 mg/dL (ref 6–23)
CO2: 27 mEq/L (ref 19–32)
Calcium: 9.5 mg/dL (ref 8.4–10.5)
Creatinine, Ser: 1.23 mg/dL (ref 0.50–1.35)
Glucose, Bld: 212 mg/dL — ABNORMAL HIGH (ref 70–99)

## 2012-04-29 MED ORDER — IPRATROPIUM BROMIDE 0.02 % IN SOLN
0.5000 mg | RESPIRATORY_TRACT | Status: AC
  Administered 2012-04-29: 0.5 mg via RESPIRATORY_TRACT
  Filled 2012-04-29: qty 2.5

## 2012-04-29 MED ORDER — ALBUTEROL (5 MG/ML) CONTINUOUS INHALATION SOLN
10.0000 mg/h | INHALATION_SOLUTION | Freq: Once | RESPIRATORY_TRACT | Status: AC
  Administered 2012-04-29: 10 mg/h via RESPIRATORY_TRACT
  Filled 2012-04-29: qty 20

## 2012-04-29 MED ORDER — PREDNISONE 20 MG PO TABS
60.0000 mg | ORAL_TABLET | Freq: Once | ORAL | Status: AC
  Administered 2012-04-29: 60 mg via ORAL
  Filled 2012-04-29: qty 3

## 2012-04-29 MED ORDER — DILTIAZEM HCL 25 MG/5ML IV SOLN
5.0000 mg | Freq: Once | INTRAVENOUS | Status: AC
  Administered 2012-04-29: 5 mg via INTRAVENOUS
  Filled 2012-04-29: qty 5

## 2012-04-29 MED ORDER — DILTIAZEM HCL 100 MG IV SOLR
5.0000 mg/h | INTRAVENOUS | Status: DC
  Administered 2012-04-29: 5 mg/h via INTRAVENOUS
  Filled 2012-04-29: qty 100

## 2012-04-29 MED ORDER — LEVOFLOXACIN IN D5W 250 MG/50ML IV SOLN
250.0000 mg | INTRAVENOUS | Status: DC
  Administered 2012-04-30: 250 mg via INTRAVENOUS
  Filled 2012-04-29 (×4): qty 50

## 2012-04-29 MED ORDER — DILTIAZEM HCL 25 MG/5ML IV SOLN
5.0000 mg | Freq: Once | INTRAVENOUS | Status: AC
  Administered 2012-04-29: 5 mg via INTRAVENOUS

## 2012-04-29 MED ORDER — ALBUTEROL SULFATE (5 MG/ML) 0.5% IN NEBU
2.5000 mg | INHALATION_SOLUTION | RESPIRATORY_TRACT | Status: AC
  Administered 2012-04-29: 2.5 mg via RESPIRATORY_TRACT
  Filled 2012-04-29: qty 0.5

## 2012-04-29 MED ORDER — LEVOFLOXACIN IN D5W 500 MG/100ML IV SOLN
500.0000 mg | Freq: Once | INTRAVENOUS | Status: AC
Start: 2012-04-29 — End: 2012-04-29
  Administered 2012-04-29: 500 mg via INTRAVENOUS
  Filled 2012-04-29: qty 100

## 2012-04-29 NOTE — Progress Notes (Signed)
Levaquin dose has been renally dose adjusted per manufacturer dosing guidelines for CrCl<63ml/min: Levaquin 500mg  IV x1 dose, then 250mg  IV daily.  Elson Clan, PHARMD 04/29/2012@10 :02 PM

## 2012-04-29 NOTE — ED Notes (Signed)
Sob got 2 days worse today.  Has not been to pmd for same, denies any cp

## 2012-04-29 NOTE — ED Notes (Signed)
Reports is breathing easier.  nad noted.

## 2012-04-29 NOTE — ED Notes (Signed)
Pt ambulated in hall on RA, sats dropped to 81% and gradually increased to 87% with ambulation.  Labored breathing with ambulation and pt reports mild distress also.  edp notified.

## 2012-04-29 NOTE — ED Provider Notes (Signed)
I have personally seen and examined the patient.  I have discussed the plan of care with the resident.  I have reviewed the documentation on PMH/FH/Soc. History.  I have reviewed the documentation of the resident and agree.    Joya Gaskins, MD 04/29/12 2203

## 2012-04-29 NOTE — ED Notes (Signed)
Dr, Rito Ehrlich here to evaluate pt

## 2012-04-29 NOTE — H&P (Signed)
Brendan Hall is an 78 y.o. male.    PCP: Colon Branch, MD, MD   Chief Complaint: Worsening shortness of breath over the last 3 days  HPI: This is 78 year old, African American male, with a past medical history of COPD, lung cancer, diabetes who was in his usual state of health till about 3 days ago, when he started noticing that he was getting short of breath. This progressively worsened. He was having a dry cough but over the last few hours he has had yellowish expectoration. Denies any fever. Has been feeling hot. Has had some palpitations but denies any chest pain. Has been lightheaded for the last few days, but denies any syncopal episode. Occasionally he has noticed leg swelling, but none currently. Denies any sick contacts. Denies any recent travel. He has been taking nebulizer treatments at home with only partial relief. He's feeling a little better since getting breathing treatments in the emergency department.   Home Medications: Prior to Admission medications   Medication Sig Start Date End Date Taking? Authorizing Provider  albuterol (VENTOLIN HFA) 108 (90 BASE) MCG/ACT inhaler Inhale 2 puffs into the lungs every 6 (six) hours as needed.   Yes Historical Provider, MD  aspirin EC 81 MG tablet Take 81 mg by mouth daily.     Yes Historical Provider, MD  Fluticasone-Salmeterol (ADVAIR) 250-50 MCG/DOSE AEPB Inhale 1 puff into the lungs every 12 (twelve) hours.     Yes Historical Provider, MD  furosemide (LASIX) 40 MG tablet Take 40 mg by mouth daily.     Yes Historical Provider, MD  ipratropium-albuterol (DUONEB) 0.5-2.5 (3) MG/3ML SOLN Take 3 mLs by nebulization 4 (four) times daily.   Yes Historical Provider, MD  metFORMIN (GLUCOPHAGE) 1000 MG tablet Take 1,000 mg by mouth 2 (two) times daily.   Yes Historical Provider, MD  mometasone (NASONEX) 50 MCG/ACT nasal spray Place 2 sprays into the nose daily.   Yes Historical Provider, MD  simvastatin (ZOCOR) 40 MG tablet Take 40 mg by  mouth daily.    Yes Historical Provider, MD  Tamsulosin HCl (FLOMAX) 0.4 MG CAPS Take 0.4 mg by mouth daily.     Yes Historical Provider, MD    Allergies: No Known Allergies  Past Medical History: Past Medical History  Diagnosis Date  . Myocardial infarction     30 years ago  . Shortness of breath   . Hyperlipidemia   . Hypertension   . Diabetes mellitus   . Arthritis   . COPD (chronic obstructive pulmonary disease)   . On home oxygen therapy     at night only  . Anemia 11/14/2011  . Colon polyps 08/2011  . BPH (benign prostatic hyperplasia)   . Urinary retention 11/15/2011  . Renal mass, right 11/16/2011  . Lung cancer     Past Surgical History  Procedure Date  . Cyst removed from face   . Colonoscopy 08/28/2011    Procedure: COLONOSCOPY;  Surgeon: Malissa Hippo, MD;  Location: [facility];  Service: Endoscopy;  Laterality: N/A;    Social History:  reports that he quit smoking about 3 years ago. His smoking use included Cigarettes. He has a 90 pack-year smoking history. He has never used smokeless tobacco. He reports that he drinks alcohol. He reports that he does not use illicit drugs.  Family History:  Family History  Problem Relation Age of Onset  . Cancer Mother   . Cancer Sister   . Cancer Brother  Review of Systems -  unobtainable from patient due to respiratory status  Physical Examination Blood pressure 174/86, pulse 89, temperature 98.4 F (36.9 C), temperature source Oral, resp. rate 32, height 5\' 7"  (1.702 m), weight 76.204 kg (168 lb), SpO2 99.00%.  General appearance: alert, cooperative, appears stated age and mild distress Head: Normocephalic, without obvious abnormality, atraumatic Eyes: conjunctivae/corneas clear. PERRL, EOM's intact.  Throat: lips, mucosa, and tongue normal; teeth and gums normal Neck: no adenopathy, no carotid bruit, no JVD, supple, symmetrical, trachea midline and thyroid not enlarged, symmetric, no  tenderness/mass/nodules Resp: Decreased air entry at the bases. Rhonchi heard bilaterally, along with wheezing. Few crackles also heard mostly on the left side. Cardio: S1-S2 is tachycardic, regular no S3, S4, rubs, murmurs, or bruits. No pedal edema. GI: soft, non-tender; bowel sounds normal; no masses,  no organomegaly Extremities: extremities normal, atraumatic, no cyanosis or edema Pulses: 2+ and symmetric Skin: Skin color, texture, turgor normal. No rashes or lesions Lymph nodes: Cervical, supraclavicular, and axillary nodes normal. Neurologic: Grossly normal. No focal deficits  Laboratory Data: Results for orders placed during the hospital encounter of 04/29/12 (from the past 48 hour(s))  BASIC METABOLIC PANEL     Status: Abnormal   Collection Time   04/29/12  4:21 PM      Component Value Range Comment   Sodium 138  135 - 145 (mEq/L)    Potassium 4.0  3.5 - 5.1 (mEq/L)    Chloride 99  96 - 112 (mEq/L)    CO2 27  19 - 32 (mEq/L)    Glucose, Bld 212 (*) 70 - 99 (mg/dL)    BUN 17  6 - 23 (mg/dL)    Creatinine, Ser 4.09  0.50 - 1.35 (mg/dL)    Calcium 9.5  8.4 - 10.5 (mg/dL)    GFR calc non Af Amer 54 (*) >90 (mL/min)    GFR calc Af Amer 62 (*) >90 (mL/min)   CBC     Status: Abnormal   Collection Time   04/29/12  4:21 PM      Component Value Range Comment   WBC 7.8  4.0 - 10.5 (K/uL)    RBC 4.28  4.22 - 5.81 (MIL/uL)    Hemoglobin 10.8 (*) 13.0 - 17.0 (g/dL)    HCT 81.1 (*) 91.4 - 52.0 (%)    MCV 84.8  78.0 - 100.0 (fL)    MCH 25.2 (*) 26.0 - 34.0 (pg)    MCHC 29.8 (*) 30.0 - 36.0 (g/dL)    RDW 78.2  95.6 - 21.3 (%)    Platelets 316  150 - 400 (K/uL)     Radiology Reports: Dg Chest 2 View  04/29/2012  *RADIOLOGY REPORT*  Clinical Data: Shortness of breath.  History of lung cancer.  CHEST - 2 VIEW  Comparison: Chest x-ray 11/14/2011.  Findings: Appearance of the right hemithorax suggests prior lobectomy (likely right upper lobectomy).  There is again an area of architectural  distortion in the right suprahilar region, and chronic left to right shift of cardiomediastinal structures related to chronic right-sided volume loss.  Coarse interstitial markings are seen throughout the lungs bilaterally.  Lungs appear somewhat hyperexpanded with flattening of the hemidiaphragms, increased retrosternal air space and pruning of the pulmonary vasculature in the periphery, compatible with underlying COPD.  No definite focal airspace consolidation on today's examination.  Blunting of the right costophrenic sulcus appears to be chronic and likely related to scarring (less likely, there could be a small right-sided pleural  effusion).  No evidence of pulmonary edema.  Heart size is mildly enlarged (increased from prior).  The mediastinal contours are distorted by patient's postsurgical changes and positioning. Atherosclerotic calcifications within the thoracic aorta.  IMPRESSION: 1.  Chronic changes of COPD and postoperative changes related to prior right sided lobectomy (likely right upper lobectomy) again noted, without definite evidence of acute cardiopulmonary disease. 2.  Mild cardiomegaly, slightly increased compared to prior examination from 11/14/2011. 3.  Atherosclerosis.  Original Report Authenticated By: Florencia Reasons, M.D.    Electrocardiogram: EKG shows a flutter at 97 beats per minute. Normal axis. Intervals appear to be normal. No Q waves. No concerning ST or T-wave changes are noted.  Assessment/Plan  Principal Problem:  *Acute exacerbation of chronic obstructive pulmonary disease (COPD) Active Problems:  Anemia  DM type 2 (diabetes mellitus, type 2)  Hypercholesteremia  History of lung cancer  Acute respiratory failure  Atrial flutter with rapid ventricular response   #1 acute exacerbation of COPD with acute respiratory failure: ABG will be checked. He'll be monitored in the step down unit. Intensive treatment with nebulizers and steroids will be given. He'll be  given antibiotics as well. CODE STATUS was discussed and he is agreeable to mechanical ventilation if needed. He may require BiPAP depending on the results of the blood gas.  #2 Atrial flutter with rapid ventricular response: This is probably triggered by the acute COPD. He does not have a known history of the same. Check a TSH level. Check echocardiogram. We'll give him a bolus dose of Cardizem. He may require an infusion. Cardiology consultation may be considered in the morning.  #3 history of, diabetes: He'll be put on a sliding scale. HbA1c will be checked. He may require basal insulin while he is on the steroids.  #4 history of lung cancer in remission.  #5 history of anemia. This is stable.  He is a full code. This was discussed with the patient in front of her daughter.  DVT, prophylaxis will be initiated.  Further management decisions will depend on results of further testing and patient's response to treatment.  [employer]  [facility] Pager [phone]  04/29/2012, 8:47 PM

## 2012-04-29 NOTE — ED Notes (Signed)
Went to ambulate pt for another pulse ox and hr 148. Dr. Bebe Shaggy made aware.

## 2012-04-29 NOTE — ED Provider Notes (Signed)
Pt here for SOB, presumed COPD Also noted be in aflutter which pt reports is new Will follow closely.   Date: 04/29/2012  Rate: 97  Rhythm: atrial flutter  QRS Axis: normal  Intervals: normal  ST/T Wave abnormalities: nonspecific ST changes  Conduction Disutrbances:none  Narrative Interpretation:   Old EKG Reviewed: changes noted    Joya Gaskins, MD 04/29/12 1816

## 2012-04-29 NOTE — ED Provider Notes (Signed)
History     CSN: [id number]  Arrival date & time 04/29/12  1603   First MD Initiated Contact with Patient 04/29/12 1606      Chief Complaint  Patient presents with  . Shortness of Breath    (Consider location/radiation/quality/duration/timing/severity/associated sxs/prior treatment) HPI Comments: 78 yo male with history of COPD on home O2, DM, HTN presents with two days of worsening shortness of breath and wheezing. Has a nonproductive cough. He is unsure of any triggers, has been compliant with COPD medications and using O2. Has been taking albuterol treatments without improvement. Last treated for COPD exacerbation in 12/12 and was hospitalized, none since then.  Former smoker.  Denies any fever, chest pain, palpitations, racing heartbeat, n/v, leg edema, difficulty urinating.    Patient is a 78 y.o. male presenting with shortness of breath.  Shortness of Breath  Associated symptoms include cough and shortness of breath. Pertinent negatives include no chest pain and no fever.    Past Medical History  Diagnosis Date  . Myocardial infarction     30 years ago  . Shortness of breath   . Hyperlipidemia   . Hypertension   . Diabetes mellitus   . Arthritis   . COPD (chronic obstructive pulmonary disease)   . On home oxygen therapy     at night only  . Anemia 11/14/2011  . Colon polyps 08/2011  . BPH (benign prostatic hyperplasia)   . Urinary retention 11/15/2011  . Renal mass, right 11/16/2011  . Lung cancer     Past Surgical History  Procedure Date  . Cyst removed from face   . Colonoscopy 08/28/2011    Procedure: COLONOSCOPY;  Surgeon: Malissa Hippo, MD;  Location: [facility];  Service: Endoscopy;  Laterality: N/A;    Family History  Problem Relation Age of Onset  . Cancer Mother   . Cancer Sister   . Cancer Brother     History  Substance Use Topics  . Smoking status: Former Smoker -- 1.5 packs/day for 60 years    Types: Cigarettes    Quit date:  08/06/2008  . Smokeless tobacco: Never Used  . Alcohol Use: Yes     occasional       Review of Systems  Constitutional: Negative for fever, activity change and appetite change.  HENT: Negative for congestion.   Respiratory: Positive for cough and shortness of breath. Negative for chest tightness.   Cardiovascular: Negative for chest pain, palpitations and leg swelling.  Genitourinary: Negative for dysuria and difficulty urinating.  Musculoskeletal: Negative for gait problem.  Neurological: Negative for dizziness.  Hematological: Does not bruise/bleed easily.  Psychiatric/Behavioral: Negative for confusion.  All other systems reviewed and are negative.    Allergies  Review of patient's allergies indicates no known allergies.  Home Medications   Current Outpatient Rx  Name Route Sig Dispense Refill  . ASPIRIN EC 81 MG PO TBEC Oral Take 81 mg by mouth daily.      Marland Kitchen FLUTICASONE-SALMETEROL 250-50 MCG/DOSE IN AEPB Inhalation Inhale 1 puff into the lungs every 12 (twelve) hours.      . FUROSEMIDE 40 MG PO TABS Oral Take 40 mg by mouth daily.      Marland Kitchen SIMVASTATIN 40 MG PO TABS Oral Take 40 mg by mouth at bedtime.      . TAMSULOSIN HCL 0.4 MG PO CAPS Oral Take 0.4 mg by mouth daily.        BP 148/75  Pulse 80  Temp(Src) 98.1 F (  36.7 C) (Oral)  Resp 29  Ht 5\' 7"  (1.702 m)  Wt 168 lb (76.204 kg)  BMI 26.31 kg/m2  SpO2 96%  Physical Exam  Vitals reviewed. Constitutional: He is oriented to person, place, and time. He appears well-developed and well-nourished. He appears distressed.       Tachypnea with accessory muscle use, belly breathing ~40 per min. Speaking in phrases.   HENT:  Head: Normocephalic and atraumatic.  Mouth/Throat: Oropharynx is clear and moist.  Eyes: EOM are normal.  Cardiovascular: Normal rate, regular rhythm and normal heart sounds.   No murmur heard. Pulmonary/Chest:       Increased work of breathing. Sitting up in bed, supraclavicular muscle use,  belly breathing. BS diminished in L>R, with insp and exp wheezing on R mid lung.   Abdominal: Soft. Bowel sounds are normal. There is no tenderness. There is no guarding.  Musculoskeletal: He exhibits no edema and no tenderness.  Lymphadenopathy:    He has no cervical adenopathy.  Neurological: He is alert and oriented to person, place, and time. Coordination normal.  Skin: No rash noted.  Psychiatric: He has a normal mood and affect.    Date: 04/29/2012  Rate: 97  Rhythm: atrial flutter and 2-3:1 conduction  QRS Axis: normal  Intervals: normal  ST/T Wave abnormalities: nonspecific T wave changes  Conduction Disutrbances:none  Narrative Interpretation:   Old EKG Reviewed: first degree AV block, sinus tachy    ED Course  Procedures (including critical care time)  Labs Reviewed  BASIC METABOLIC PANEL - Abnormal; Notable for the following:    Glucose, Bld 212 (*)    GFR calc non Af Amer 54 (*)    GFR calc Af Amer 62 (*)    All other components within normal limits  CBC - Abnormal; Notable for the following:    Hemoglobin 10.8 (*)    HCT 36.3 (*)    MCH 25.2 (*)    MCHC 29.8 (*)    All other components within normal limits   Dg Chest 2 View  04/29/2012  *RADIOLOGY REPORT*  Clinical Data: Shortness of breath.  History of lung cancer.  CHEST - 2 VIEW  Comparison: Chest x-ray 11/14/2011.  Findings: Appearance of the right hemithorax suggests prior lobectomy (likely right upper lobectomy).  There is again an area of architectural distortion in the right suprahilar region, and chronic left to right shift of cardiomediastinal structures related to chronic right-sided volume loss.  Coarse interstitial markings are seen throughout the lungs bilaterally.  Lungs appear somewhat hyperexpanded with flattening of the hemidiaphragms, increased retrosternal air space and pruning of the pulmonary vasculature in the periphery, compatible with underlying COPD.  No definite focal airspace consolidation  on today's examination.  Blunting of the right costophrenic sulcus appears to be chronic and likely related to scarring (less likely, there could be a small right-sided pleural effusion).  No evidence of pulmonary edema.  Heart size is mildly enlarged (increased from prior).  The mediastinal contours are distorted by patient's postsurgical changes and positioning. Atherosclerotic calcifications within the thoracic aorta.  IMPRESSION: 1.  Chronic changes of COPD and postoperative changes related to prior right sided lobectomy (likely right upper lobectomy) again noted, without definite evidence of acute cardiopulmonary disease. 2.  Mild cardiomegaly, slightly increased compared to prior examination from 11/14/2011. 3.  Atherosclerosis.  Original Report Authenticated By: Florencia Reasons, M.D.     1. COPD exacerbation   2. Atrial flutter      MDM  78 yo male with hx COPD on home O2 presents with diffuse bronchospasm and dyspnea, increased work of breathing. He presents at 88% on room air, is placed on 2L and now 93-100%. Will give trial of duoneb and prednisone.  New atrial flutter on EKG-no noted history of this, rate controlled, maybe contributing to symptoms, will reassess after breathing treatment. Check CXR, CBC to rule out underlying infection.   5:46 PM Subjective improvement in dyspnea, tachypnea. Improved lung sounds still with significant wheezing. No hypoxia. Noted on telemetry rate variable. Will ambulate to assess degree of tachycardia.   8:08 PM Patient ambulated in hallway with severe dyspnea and significant work of breathing, desaturated to 80%. Now s/p continuous nebulizer x 1 hour and persistent work of breathing. 96% on 2L. Also new onset atrial flutter with rapid ventricular response with little movement/activity. Currently 100-110s resting. With continued respiratory distress despite bronchodilator and new arrhythmia, will consult for inpatient admission.  Durwin Reges,  MD 04/29/12 2015  Durwin Reges, MD 04/29/12 2043

## 2012-04-30 ENCOUNTER — Encounter (HOSPITAL_COMMUNITY): Payer: Self-pay | Admitting: Unknown Physician Specialty

## 2012-04-30 DIAGNOSIS — E1165 Type 2 diabetes mellitus with hyperglycemia: Secondary | ICD-10-CM

## 2012-04-30 DIAGNOSIS — I369 Nonrheumatic tricuspid valve disorder, unspecified: Secondary | ICD-10-CM

## 2012-04-30 DIAGNOSIS — J441 Chronic obstructive pulmonary disease with (acute) exacerbation: Secondary | ICD-10-CM

## 2012-04-30 DIAGNOSIS — R112 Nausea with vomiting, unspecified: Secondary | ICD-10-CM

## 2012-04-30 DIAGNOSIS — I4892 Unspecified atrial flutter: Secondary | ICD-10-CM

## 2012-04-30 DIAGNOSIS — Z85118 Personal history of other malignant neoplasm of bronchus and lung: Secondary | ICD-10-CM

## 2012-04-30 LAB — PROTIME-INR
INR: 1.14 (ref 0.00–1.49)
Prothrombin Time: 14.8 seconds (ref 11.6–15.2)

## 2012-04-30 LAB — COMPREHENSIVE METABOLIC PANEL
AST: 10 U/L (ref 0–37)
CO2: 25 mEq/L (ref 19–32)
Calcium: 9.1 mg/dL (ref 8.4–10.5)
Creatinine, Ser: 1.14 mg/dL (ref 0.50–1.35)
GFR calc Af Amer: 68 mL/min — ABNORMAL LOW (ref >90)
GFR calc non Af Amer: 59 mL/min — ABNORMAL LOW (ref >90)
Glucose, Bld: 309 mg/dL — ABNORMAL HIGH (ref 70–99)

## 2012-04-30 LAB — CBC
Hemoglobin: 10.3 g/dL — ABNORMAL LOW (ref 13.0–17.0)
RBC: 4.07 MIL/uL — ABNORMAL LOW (ref 4.22–5.81)

## 2012-04-30 LAB — TSH: TSH: 1.479 u[IU]/mL (ref 0.350–4.500)

## 2012-04-30 LAB — CARDIAC PANEL(CRET KIN+CKTOT+MB+TROPI)
CK, MB: 4.7 ng/mL — ABNORMAL HIGH (ref 0.3–4.0)
CK, MB: 4.8 ng/mL — ABNORMAL HIGH (ref 0.3–4.0)
Relative Index: 3.2 — ABNORMAL HIGH (ref 0.0–2.5)
Total CK: 130 U/L (ref 7–232)
Total CK: 149 U/L (ref 7–232)

## 2012-04-30 LAB — GLUCOSE, CAPILLARY: Glucose-Capillary: 259 mg/dL — ABNORMAL HIGH (ref 70–99)

## 2012-04-30 LAB — IRON AND TIBC: Iron: 25 ug/dL — ABNORMAL LOW (ref 42–135)

## 2012-04-30 LAB — PRO B NATRIURETIC PEPTIDE: Pro B Natriuretic peptide (BNP): 1787 pg/mL — ABNORMAL HIGH (ref 0–450)

## 2012-04-30 LAB — APTT: aPTT: 28 seconds (ref 24–37)

## 2012-04-30 MED ORDER — IPRATROPIUM BROMIDE 0.02 % IN SOLN
0.5000 mg | RESPIRATORY_TRACT | Status: DC
  Administered 2012-04-30 – 2012-05-03 (×18): 0.5 mg via RESPIRATORY_TRACT
  Filled 2012-04-30 (×19): qty 2.5

## 2012-04-30 MED ORDER — DILTIAZEM LOAD VIA INFUSION
10.0000 mg | Freq: Once | INTRAVENOUS | Status: AC | PRN
  Administered 2012-04-30: 0 mg/h via INTRAVENOUS
  Filled 2012-04-30: qty 10

## 2012-04-30 MED ORDER — FLUTICASONE-SALMETEROL 250-50 MCG/DOSE IN AEPB
1.0000 | INHALATION_SPRAY | Freq: Two times a day (BID) | RESPIRATORY_TRACT | Status: DC
  Administered 2012-04-30 – 2012-05-03 (×6): 1 via RESPIRATORY_TRACT
  Filled 2012-04-30: qty 14

## 2012-04-30 MED ORDER — ONDANSETRON HCL 4 MG/2ML IJ SOLN: 4.0000 mg | Freq: Four times a day (QID) | INTRAMUSCULAR | Status: DC | PRN

## 2012-04-30 MED ORDER — ACETAMINOPHEN 650 MG RE SUPP: 650.0000 mg | Freq: Four times a day (QID) | RECTAL | Status: DC | PRN

## 2012-04-30 MED ORDER — SODIUM CHLORIDE 0.9 % IJ SOLN
3.0000 mL | Freq: Two times a day (BID) | INTRAMUSCULAR | Status: DC
  Administered 2012-04-30 – 2012-05-02 (×5): 3 mL via INTRAVENOUS
  Filled 2012-04-30 (×4): qty 3

## 2012-04-30 MED ORDER — ACETAMINOPHEN 325 MG PO TABS: 650.0000 mg | ORAL_TABLET | Freq: Four times a day (QID) | ORAL | Status: DC | PRN

## 2012-04-30 MED ORDER — ASPIRIN EC 81 MG PO TBEC
81.0000 mg | DELAYED_RELEASE_TABLET | Freq: Every day | ORAL | Status: DC
  Administered 2012-04-30 – 2012-05-01 (×2): 81 mg via ORAL
  Filled 2012-04-30 (×4): qty 1

## 2012-04-30 MED ORDER — SODIUM CHLORIDE 0.9 % IJ SOLN
INTRAMUSCULAR | Status: AC
  Administered 2012-04-30: 10 mL
  Filled 2012-04-30: qty 3

## 2012-04-30 MED ORDER — DILTIAZEM HCL 30 MG PO TABS
30.0000 mg | ORAL_TABLET | Freq: Four times a day (QID) | ORAL | Status: DC
  Administered 2012-04-30 – 2012-05-01 (×4): 30 mg via ORAL
  Filled 2012-04-30 (×4): qty 1

## 2012-04-30 MED ORDER — INSULIN ASPART 100 UNIT/ML ~~LOC~~ SOLN: 0.0000 [IU] | Freq: Three times a day (TID) | SUBCUTANEOUS | Status: DC

## 2012-04-30 MED ORDER — INSULIN ASPART 100 UNIT/ML ~~LOC~~ SOLN
0.0000 [IU] | Freq: Every day | SUBCUTANEOUS | Status: DC
  Administered 2012-05-01: 3 [IU] via SUBCUTANEOUS

## 2012-04-30 MED ORDER — DILTIAZEM HCL 100 MG IV SOLR
5.0000 mg/h | INTRAVENOUS | Status: DC
  Administered 2012-04-30: 10 mg/h via INTRAVENOUS
  Filled 2012-04-30: qty 100

## 2012-04-30 MED ORDER — FAMOTIDINE 20 MG PO TABS
20.0000 mg | ORAL_TABLET | Freq: Every day | ORAL | Status: DC
  Administered 2012-04-30 – 2012-05-03 (×4): 20 mg via ORAL
  Filled 2012-04-30 (×4): qty 1

## 2012-04-30 MED ORDER — INSULIN GLARGINE 100 UNIT/ML ~~LOC~~ SOLN
15.0000 [IU] | Freq: Two times a day (BID) | SUBCUTANEOUS | Status: DC
  Administered 2012-04-30 – 2012-05-02 (×5): 15 [IU] via SUBCUTANEOUS

## 2012-04-30 MED ORDER — SODIUM CHLORIDE 0.9 % IJ SOLN
INTRAMUSCULAR | Status: AC
  Administered 2012-04-30: 10 mL
  Filled 2012-04-30: qty 6

## 2012-04-30 MED ORDER — SODIUM CHLORIDE 0.9 % IJ SOLN
INTRAMUSCULAR | Status: AC
  Filled 2012-04-30: qty 3

## 2012-04-30 MED ORDER — FUROSEMIDE 40 MG PO TABS
40.0000 mg | ORAL_TABLET | Freq: Every day | ORAL | Status: DC
  Administered 2012-04-30 – 2012-05-03 (×4): 40 mg via ORAL
  Filled 2012-04-30 (×4): qty 1

## 2012-04-30 MED ORDER — TAMSULOSIN HCL 0.4 MG PO CAPS
0.4000 mg | ORAL_CAPSULE | Freq: Every day | ORAL | Status: DC
  Administered 2012-04-30 – 2012-05-03 (×4): 0.4 mg via ORAL
  Filled 2012-04-30 (×6): qty 1

## 2012-04-30 MED ORDER — ATORVASTATIN CALCIUM 20 MG PO TABS
20.0000 mg | ORAL_TABLET | Freq: Every day | ORAL | Status: DC
  Administered 2012-04-30 – 2012-05-02 (×3): 20 mg via ORAL
  Filled 2012-04-30 (×3): qty 1

## 2012-04-30 MED ORDER — SODIUM CHLORIDE 0.9 % IJ SOLN
INTRAMUSCULAR | Status: AC
  Administered 2012-04-30: 06:00:00
  Filled 2012-04-30: qty 3

## 2012-04-30 MED ORDER — SIMVASTATIN 20 MG PO TABS
40.0000 mg | ORAL_TABLET | Freq: Every day | ORAL | Status: DC
  Filled 2012-04-30: qty 1

## 2012-04-30 MED ORDER — LEVALBUTEROL HCL 0.63 MG/3ML IN NEBU
0.6300 mg | INHALATION_SOLUTION | RESPIRATORY_TRACT | Status: DC | PRN
  Administered 2012-05-03: 0.63 mg via RESPIRATORY_TRACT
  Filled 2012-04-30: qty 3

## 2012-04-30 MED ORDER — ONDANSETRON HCL 4 MG PO TABS: 4.0000 mg | ORAL_TABLET | Freq: Four times a day (QID) | ORAL | Status: DC | PRN

## 2012-04-30 MED ORDER — METHYLPREDNISOLONE SODIUM SUCC 125 MG IJ SOLR
80.0000 mg | Freq: Four times a day (QID) | INTRAMUSCULAR | Status: DC
  Administered 2012-04-30 – 2012-05-01 (×6): 80 mg via INTRAVENOUS
  Filled 2012-04-30 (×6): qty 2

## 2012-04-30 MED ORDER — INSULIN ASPART 100 UNIT/ML ~~LOC~~ SOLN
0.0000 [IU] | Freq: Three times a day (TID) | SUBCUTANEOUS | Status: DC
  Administered 2012-04-30: 7 [IU] via SUBCUTANEOUS
  Administered 2012-04-30: 11 [IU] via SUBCUTANEOUS
  Administered 2012-04-30: 3 [IU] via SUBCUTANEOUS
  Administered 2012-05-01: 4 [IU] via SUBCUTANEOUS
  Administered 2012-05-01 (×2): 7 [IU] via SUBCUTANEOUS
  Administered 2012-05-02: 11 [IU] via SUBCUTANEOUS
  Administered 2012-05-02 (×2): 4 [IU] via SUBCUTANEOUS

## 2012-04-30 MED ORDER — LEVALBUTEROL HCL 0.63 MG/3ML IN NEBU
0.6300 mg | INHALATION_SOLUTION | RESPIRATORY_TRACT | Status: DC
  Administered 2012-04-30 – 2012-05-03 (×17): 0.63 mg via RESPIRATORY_TRACT
  Filled 2012-04-30 (×18): qty 3

## 2012-04-30 MED ORDER — HEPARIN SODIUM (PORCINE) 5000 UNIT/ML IJ SOLN
5000.0000 [IU] | Freq: Three times a day (TID) | INTRAMUSCULAR | Status: DC
  Administered 2012-04-30 – 2012-05-01 (×4): 5000 [IU] via SUBCUTANEOUS
  Filled 2012-04-30 (×9): qty 1

## 2012-04-30 MED ORDER — LISINOPRIL 5 MG PO TABS
2.5000 mg | ORAL_TABLET | Freq: Every day | ORAL | Status: DC
  Administered 2012-04-30 – 2012-05-01 (×2): 2.5 mg via ORAL
  Filled 2012-04-30 (×5): qty 1

## 2012-04-30 NOTE — Progress Notes (Signed)
Subjective: Patient says that he is breathing about the same. He has no complaints of chest pain or chest palpitations.  Objective: Vital signs in last 24 hours: Filed Vitals:   04/30/12 0615 04/30/12 0630 04/30/12 0645 04/30/12 0728  BP: 158/80 128/66 136/73   Pulse: 73 72 72   Temp:      TempSrc:      Resp: 26 14 27    Height:      Weight:      SpO2: 95% 93% 93% 93%    Intake/Output Summary (Last 24 hours) at 04/30/12 0757 Last data filed at 04/30/12 1610  Gross per 24 hour  Intake  72.42 ml  Output      0 ml  Net  72.42 ml    Weight change:   Physical exam: General: Elderly 78 year old after American man, sitting up in bed, in no acute distress. Lungs: Faint bilateral wheezes. Breathing is mildly labored. Heart: S1, S2, with occasional ectopy. Abdomen: Positive bowel sounds, soft, nontender, nondistended. Extremities: Trace of pedal edema bilaterally.  Lab Results: Basic Metabolic Panel:  Basename 04/30/12 0511 04/29/12 1621  NA 137 138  K 4.1 4.0  CL 100 99  CO2 25 27  GLUCOSE 309* 212*  BUN 20 17  CREATININE 1.14 1.23  CALCIUM 9.1 9.5  MG -- --  PHOS -- --   Liver Function Tests:  Basename 04/30/12 0511  AST 10  ALT 7  ALKPHOS 61  BILITOT 0.5  PROT 6.8  ALBUMIN 3.5   No results found for this basename: LIPASE:2,AMYLASE:2 in the last 72 hours No results found for this basename: AMMONIA:2 in the last 72 hours CBC:  Basename 04/30/12 0511 04/29/12 1621  WBC 6.0 7.8  NEUTROABS -- --  HGB 10.3* 10.8*  HCT 33.8* 36.3*  MCV 83.0 84.8  PLT 314 316   Cardiac Enzymes:  Basename 04/30/12 0021  CKTOTAL 130  CKMB 4.7*  CKMBINDEX --  TROPONINI <0.30   BNP: No results found for this basename: PROBNP:3 in the last 72 hours D-Dimer: No results found for this basename: DDIMER:2 in the last 72 hours CBG: No results found for this basename: GLUCAP:6 in the last 72 hours Hemoglobin A1C: No results found for this basename: HGBA1C in the last 72  hours Fasting Lipid Panel: No results found for this basename: CHOL,HDL,LDLCALC,TRIG,CHOLHDL,LDLDIRECT in the last 72 hours Thyroid Function Tests: No results found for this basename: TSH,T4TOTAL,FREET4,T3FREE,THYROIDAB in the last 72 hours Anemia Panel: No results found for this basename: VITAMINB12,FOLATE,FERRITIN,TIBC,IRON,RETICCTPCT in the last 72 hours Coagulation:  Basename 04/30/12 0511  LABPROT 14.8  INR 1.14   Urine Drug Screen: Drugs of Abuse  No results found for this basename: labopia, cocainscrnur, labbenz, amphetmu, thcu, labbarb    Alcohol Level: No results found for this basename: ETH:2 in the last 72 hours Urinalysis: No results found for this basename: COLORURINE:2,APPERANCEUR:2,LABSPEC:2,PHURINE:2,GLUCOSEU:2,HGBUR:2,BILIRUBINUR:2,KETONESUR:2,PROTEINUR:2,UROBILINOGEN:2,NITRITE:2,LEUKOCYTESUR:2 in the last 72 hours Misc. Labs:  Micro: Recent Results (from the past 240 hour(s))  MRSA PCR SCREENING     Status: Normal   Collection Time   04/30/12 12:25 AM      Component Value Range Status Comment   MRSA by PCR NEGATIVE  NEGATIVE  Final     Studies/Results: Dg Chest 2 View  04/29/2012  *RADIOLOGY REPORT*  Clinical Data: Shortness of breath.  History of lung cancer.  CHEST - 2 VIEW  Comparison: Chest x-ray 11/14/2011.  Findings: Appearance of the right hemithorax suggests prior lobectomy (likely right upper lobectomy).  There is again  an area of architectural distortion in the right suprahilar region, and chronic left to right shift of cardiomediastinal structures related to chronic right-sided volume loss.  Coarse interstitial markings are seen throughout the lungs bilaterally.  Lungs appear somewhat hyperexpanded with flattening of the hemidiaphragms, increased retrosternal air space and pruning of the pulmonary vasculature in the periphery, compatible with underlying COPD.  No definite focal airspace consolidation on today's examination.  Blunting of the right  costophrenic sulcus appears to be chronic and likely related to scarring (less likely, there could be a small right-sided pleural effusion).  No evidence of pulmonary edema.  Heart size is mildly enlarged (increased from prior).  The mediastinal contours are distorted by patient's postsurgical changes and positioning. Atherosclerotic calcifications within the thoracic aorta.  IMPRESSION: 1.  Chronic changes of COPD and postoperative changes related to prior right sided lobectomy (likely right upper lobectomy) again noted, without definite evidence of acute cardiopulmonary disease. 2.  Mild cardiomegaly, slightly increased compared to prior examination from 11/14/2011. 3.  Atherosclerosis.  Original Report Authenticated By: Florencia Reasons, M.D.    Medications: And Scheduled:   . ipratropium  0.5 mg Nebulization NOW   And  . albuterol  2.5 mg Nebulization NOW  . albuterol  10 mg/hr Nebulization Once  . aspirin EC  81 mg Oral Daily  . diltiazem  5 mg Intravenous Once  . diltiazem  5 mg Intravenous Once  . Fluticasone-Salmeterol  1 puff Inhalation Q12H  . furosemide  40 mg Oral Daily  . heparin  5,000 Units Subcutaneous Q8H  . insulin aspart  0-20 Units Subcutaneous TID WC  . insulin aspart  0-5 Units Subcutaneous QHS  . insulin glargine  15 Units Subcutaneous BID  . ipratropium  0.5 mg Nebulization Q4H  . levalbuterol  0.63 mg Nebulization Q4H  . levofloxacin (LEVAQUIN) IV  250 mg Intravenous Q24H  . levofloxacin (LEVAQUIN) IV  500 mg Intravenous Once  . methylPREDNISolone (SOLU-MEDROL) injection  80 mg Intravenous Q6H  . predniSONE  60 mg Oral Once  . simvastatin  40 mg Oral q1800  . sodium chloride  3 mL Intravenous Q12H  . sodium chloride      . sodium chloride      . sodium chloride      . sodium chloride      . Tamsulosin HCl  0.4 mg Oral Daily  . DISCONTD: insulin aspart  0-15 Units Subcutaneous TID WC   Continuous:   . diltiazem (CARDIZEM) infusion 5 mg/hr (04/30/12 0621)    . DISCONTD: diltiazem (CARDIZEM) infusion 5 mg/hr (04/29/12 2158)   WUJ:[id number], acetaminophen, diltiazem, levalbuterol, ondansetron (ZOFRAN) IV, ondansetron  Assessment: Principal Problem:  *Acute exacerbation of chronic obstructive pulmonary disease (COPD) Active Problems:  Anemia  DM type 2 (diabetes mellitus, type 2)  Hypercholesteremia  History of lung cancer  Acute respiratory failure  Atrial flutter with rapid ventricular response   1. COPD, oxygen dependent, with acute exacerbation in the setting of chronic hypoxic respiratory failure. We'll continue Levaquin, Solu-Medrol, bronchodilator therapy, and oxygen.  Newly diagnosed/new onset atrial flutter with rapid ventricular response. No recent known history of atrial flutter. Symptomatology may be secondary to COPD with acute exacerbation. Cardizem drip has been started. His rate is now controlled. He is on subcutaneous heparin and aspirin. We'll await cardiology input.  Type 2 diabetes mellitus, uncontrolled. Uncontrolled in part secondary to steroids. Sliding scale NovoLog and Lantus have been adjusted accordingly. He is on metformin chronically. It is being held for  now.  Normocytic anemia. We'll order an anemia panel.   Elevated alkaline phosphatase. This is noted and will be monitored accordingly.   Plan: 1. Continue Cardizem drip. Consider discontinuing Cardizem drip and starting oral Cardizem, but will defer to cardiology. 2-D echocardiogram has been ordered. We'll check the results. TSH and free T4 have been ordered. We'll check the results. Cardiac enzymes have been negative thus far. Cardiology has been consulted. 2. We'll order an anemia panel. 3. Sliding scale NovoLog has been changed to the resistive scale. Lantus was added as well. 4. We'll add Pepcid because of steroids.    Total ICU time 40 minutes.   LOS: 1 day   Brendan Hall 04/30/2012, 7:57 AM

## 2012-04-30 NOTE — Progress Notes (Signed)
*  PRELIMINARY RESULTS* Echocardiogram 2D Echocardiogram has been performed.  Brendan Hall 04/30/2012, 9:25 AM

## 2012-04-30 NOTE — Evaluation (Signed)
Physical Therapy Evaluation Patient Details Name: Brendan Hall MRN: [id number] DOB: 08-24-1930 Today's Date: 04/30/2012 Time: 4098-1191 PT Time Calculation (min): 42 min  PT Assessment / Plan / Recommendation Clinical Impression  Pt is very alert and cooperative, concerned about his cardiovascular status.  He had been active and independent at home PTA.  Currently, he is O2 dependent and hads decreased ambulatory endurance.  This should improve as his medical status improves.  We will follow as inpatient in order to increase gait endurance.    PT Assessment  Patient needs continued PT services    Follow Up Recommendations  No PT follow up    Barriers to Discharge None      lEquipment Recommendations  None recommended by PT    Recommendations for Other Services     Frequency Min 3X/week    Precautions / Restrictions Precautions Precautions: None Restrictions Weight Bearing Restrictions: No   Pertinent Vitals/Pain       Mobility  Bed Mobility Bed Mobility: Supine to Sit;Sit to Supine Supine to Sit: 6: Modified independent (Device/Increase time);HOB flat Sit to Supine: 6: Modified independent (Device/Increase time);HOB flat Transfers Transfers: Sit to Stand;Stand to Sit Sit to Stand: 7: Independent;Without upper extremity assist Stand to Sit: 7: Independent;Without upper extremity assist Ambulation/Gait Ambulation/Gait Assistance: 6: Modified independent (Device/Increase time) Ambulation Distance (Feet): 20 Feet Assistive device: Straight cane Gait Pattern: Within Functional Limits Gait velocity: WNL Stairs: No Wheelchair Mobility Wheelchair Mobility: No    Exercises     PT Diagnosis: Generalized weakness  PT Problem List: Decreased activity tolerance;Decreased mobility;Cardiopulmonary status limiting activity PT Treatment Interventions: Gait training   PT Goals Acute Rehab PT Goals PT Goal Formulation: With patient Time For Goal Achievement:  05/07/12 Potential to Achieve Goals: Good Pt will Ambulate: 51 - 150 feet;with modified independence;with cane PT Goal: Ambulate - Progress: Goal set today  Visit Information  Last PT Received On: 04/30/12    Subjective Data  Subjective: no c/o Patient Stated Goal: return home   Prior Functioning  Home Living Lives With: Spouse Available Help at Discharge: Family Type of Home: House Home Access: Level entry Home Layout: Laundry or work area in basement [employer] Shower/Tub: Engineer, manufacturing systems: Standard Home Adaptive Equipment: Straight cane Prior Function Level of Independence: Independent with assistive device(s) Driving: Yes Vocation: Retired Musician: No difficulties    Cognition  Overall Cognitive Status: Appears within functional limits for tasks assessed/performed Arousal/Alertness: Awake/alert Orientation Level: Appears intact for tasks assessed Behavior During Session: [facility] for tasks performed    Extremity/Trunk Assessment Right Upper Extremity Assessment RUE ROM/Strength/Tone: WFL for tasks assessed RUE Sensation: WFL - Light Touch;WFL - Proprioception RUE Coordination: WFL - gross motor Left Upper Extremity Assessment LUE ROM/Strength/Tone: Deficits LUE ROM/Strength/Tone Deficits: OA of shoulder with limitation of strength and ROM LUE Sensation: WFL - Light Touch;WFL - Proprioception LUE Coordination: WFL - gross motor Right Lower Extremity Assessment RLE ROM/Strength/Tone: WFL for tasks assessed RLE Sensation: WFL - Light Touch;WFL - Proprioception RLE Coordination: WFL - gross motor Left Lower Extremity Assessment LLE ROM/Strength/Tone: WFL for tasks assessed LLE Sensation: WFL - Light Touch;WFL - Proprioception LLE Coordination: WFL - gross motor Trunk Assessment Trunk Assessment: Normal   Balance Balance Balance Assessed: No (WNL by observation)  End of Session PT - End of Session Equipment Utilized During Treatment:  Gait belt Activity Tolerance: Patient tolerated treatment well Patient left: in chair;with call bell/phone within reach;with family/visitor present Nurse Communication: Mobility status   Konrad Penta  04/30/2012, 3:09 PM

## 2012-04-30 NOTE — Consult Note (Signed)
CARDIOLOGY CONSULT NOTE  Patient ID: Brendan Hall MRN: [id number] DOB/AGE: [birth year] 78 y.o.  Admit date: 04/29/2012 Referring Physician: PTH Primary PhysicianQURESHI,AYYAZ, MD, MD Consulting Cardiologist: Nona Dell Reason for Consultation: New onset atrial flutter  Principal Problem:  *Acute exacerbation of chronic obstructive pulmonary disease (COPD) Active Problems:  Anemia  DM type 2 (diabetes mellitus, type 2)  Hypercholesteremia  History of lung cancer  Acute respiratory failure  Atrial flutter with rapid ventricular response  HPI: Brendan Hall is a 78 y/o patient with no known history of CAD, or prior cardiac work up admitted after 4 days of progressive shortness of breath and PND, with newly diagnosed atrial flutter.. He has a history of COPD, diabetes, hypertension, and hyperlipidemia. He also had been diagnosed with lung CA and treated with chemotherapy and radiation in 2005, anemia, renal insufficiency.  He has been placed on diltiazem drip with good rate control. We are asked for further recommendations.   Review of systems complete and found to be negative unless listed above   Past Medical History  Diagnosis Date  . Myocardial infarction     30 years ago  . Shortness of breath   . Hyperlipidemia   . Hypertension   . Diabetes mellitus   . Arthritis   . COPD (chronic obstructive pulmonary disease)   . On home oxygen therapy     at night only  . Anemia 11/14/2011  . Colon polyps 08/2011  . BPH (benign prostatic hyperplasia)   . Urinary retention 11/15/2011  . Renal mass, right 11/16/2011  . Lung cancer     Family History  Problem Relation Age of Onset  . Cancer Mother   . Cancer Sister   . Cancer Brother     History   Social History  . Marital Status: Married    Spouse Name: N/A    Number of Children: N/A  . Years of Education: N/A   Occupational History  . Not on file.   Social History Main Topics  . Smoking status: Former Smoker  -- 1.5 packs/day for 60 years    Types: Cigarettes    Quit date: 08/06/2008  . Smokeless tobacco: Never Used  . Alcohol Use: Yes     occasional   . Drug Use: No  . Sexually Active: No   Other Topics Concern  . Not on file   Social History Narrative  . No narrative on file    Past Surgical History  Procedure Date  . Cyst removed from face   . Colonoscopy 08/28/2011    Procedure: COLONOSCOPY;  Surgeon: Malissa Hippo, MD;  Location: [facility];  Service: Endoscopy;  Laterality: N/A;     Prescriptions prior to admission  Medication Sig Dispense Refill  . albuterol (VENTOLIN HFA) 108 (90 BASE) MCG/ACT inhaler Inhale 2 puffs into the lungs every 6 (six) hours as needed.      Marland Kitchen aspirin EC 81 MG tablet Take 81 mg by mouth daily.        . Fluticasone-Salmeterol (ADVAIR) 250-50 MCG/DOSE AEPB Inhale 1 puff into the lungs every 12 (twelve) hours.        . furosemide (LASIX) 40 MG tablet Take 40 mg by mouth daily.        Marland Kitchen ipratropium-albuterol (DUONEB) 0.5-2.5 (3) MG/3ML SOLN Take 3 mLs by nebulization 4 (four) times daily.      . metFORMIN (GLUCOPHAGE) 1000 MG tablet Take 1,000 mg by mouth 2 (two) times daily.      Marland Kitchen  mometasone (NASONEX) 50 MCG/ACT nasal spray Place 2 sprays into the nose daily.      . simvastatin (ZOCOR) 40 MG tablet Take 40 mg by mouth daily.       . Tamsulosin HCl (FLOMAX) 0.4 MG CAPS Take 0.4 mg by mouth daily.          Physical Exam: Blood pressure 148/72, pulse 102, temperature 98.5 F (36.9 C), temperature source Oral, resp. rate 25, height 5\' 8"  (1.727 m), weight 168 lb 3.4 oz (76.3 kg), SpO2 94.00%.   General: Well developed, well nourished, in no acute distress Head: Eyes PERRLA, No xanthomas.   Normal cephalic and atramatic  Lungs: Inspiratory wheezes, diminished breath sounds on the right, mild crackles. No cough. Heart: HRIR  Without 1.6 systolic murmur.  Pulses are 2+ & equal.            No carotid bruit. No JVD.  No abdominal bruits. No femoral  bruits.Diminished pulses in the LE.  Abdomen: Bowel sounds are positive, abdomen mildly distended, and non-tender without masses or                  Hernia's noted. Msk:  Back normal, normal gait. Normal strength and tone for age. Extremities: No clubbing, cyanosis or edema.   Neuro: Alert and oriented X 3. Psych:  Good affect, responds appropriately   Labs:   Lab Results  Component Value Date   WBC 6.0 04/30/2012   HGB 10.3* 04/30/2012   HCT 33.8* 04/30/2012   MCV 83.0 04/30/2012   PLT 314 04/30/2012    Lab 04/30/12 0511  NA 137  K 4.1  CL 100  CO2 25  BUN 20  CREATININE 1.14  CALCIUM 9.1  PROT 6.8  BILITOT 0.5  ALKPHOS 61  ALT 7  AST 10  GLUCOSE 309*   Lab Results  Component Value Date   CKTOTAL 130 04/30/2012   CKMB 4.7* 04/30/2012   TROPONINI <0.30 04/30/2012        Radiology: Dg Chest 2 View  04/29/2012  *RADIOLOGY REPORT*  Clinical Data: Shortness of breath.  History of lung cancer.  CHEST - 2 VIEW  Comparison: Chest x-ray 11/14/2011.   IMPRESSION: 1.  Chronic changes of COPD and postoperative changes related to prior right sided lobectomy (likely right upper lobectomy) again noted, without definite evidence of acute cardiopulmonary disease. 2.  Mild cardiomegaly, slightly increased compared to prior examination from 11/14/2011. 3.  Atherosclerosis.  Original Report Authenticated By: Florencia Reasons, M.D.   EKG:(Admission) Atrial flutter with variable block, rate of 74 bpm.  Current: Atrial flutter with 4:1 block rate of 74 bpm.  ASSESSMENT AND PLAN:   1. New Onset Atrial flutter: He is rate controlled on diltiazem Drip at 5 mg/hr. Echo in progress. CHAD's score 3 (age, hypertension, diabetes). Now on heparin SQ. Duration is uncertain, would guess for the last 4 days with symptoms of dyspnea.  This may be related to acute illness with COPD exacerbation. Can transition to po diltiazem 30 mg QID and continue to monitor HR response. On ASA 81 mg daily. Discuss need to begin  Xarelto or coumadin with Dr. Diona Browner.   2. Hyperlipidemia: By history. Will check cholesterol panel.  3. COPD: Now being treated with abx, and breathing treatments. States he is feeling better. I will check a BNP for evaluation of CHF component.   4. Hypertension: By history. Currently  controlled on diltiazem only with lasix. Addition of low dose ACE inhibitor, lisinopril 2.5 mg  will be ordered. Creatinine 1.23.   Signed: Bettey Mare. Lyman Bishop NP [facility] 04/30/2012, 8:22 AM Co-Sign MD  Attending note:  Patient seen and examined. Reviewed records and discussed case with Ms. Lawrence whose findings are detailed above. Patient presents with shortness of breath, known history of lung cancer status post previous chemotherapy and radiation, also COPD, diabetes mellitus, and hypertension. He is noted to be in atrial flutter, initially with rapid ventricular response, overall duration is uncertain. ECG from December showed sinus rhythm. He reports no specific palpitations, only shortness of breath and fatigue.  On examination blood pressure is 132/71, heart rate has come down into the 70s to 80s on rate control medication but does increase with movement. He is afebrile, oxygen saturation 94% on 2 L nasal cannula. Lungs exhibit diminished breath sounds with end expiratory wheezes, no egophony. Cardiac exam with distant irregular heart sounds, no obvious gallop. No pitting edema noted.  Lab work reviewed finding potassium 4.1, BUN 20, creatinine 1.1, normal troponin levels, GFR 68, hemoglobin 10.3, platelets 314.  Atrial flutter of uncertain duration, possibly recent onset with COPD exacerbation, although he feels no palpitations and this could be longer standing. CHADS2 score is 3. He reports no major history of bleeding problems, although he is mildly anemic. GFR is normal. Would be reasonable to consider anticoagulation, either Coumadin or Xarelto, particularly if he ultimately requires  attempt at cardioversion. Would avoid TEE given his lung disease and COPD, and due to the fact that it looks like heart rate will be able to be controlled fairly well with medications in the interim. We will follow up on a surface echocardiogram. He stated he would like to think about things further prior to initiating any anticoagulation. We will continue to follow with you.  Jonelle Sidle, M.D., F.A.C.C.

## 2012-05-01 ENCOUNTER — Encounter (HOSPITAL_COMMUNITY): Payer: Self-pay | Admitting: Internal Medicine

## 2012-05-01 DIAGNOSIS — I4892 Unspecified atrial flutter: Secondary | ICD-10-CM

## 2012-05-01 DIAGNOSIS — R112 Nausea with vomiting, unspecified: Secondary | ICD-10-CM

## 2012-05-01 DIAGNOSIS — N289 Disorder of kidney and ureter, unspecified: Secondary | ICD-10-CM | POA: Diagnosis not present

## 2012-05-01 DIAGNOSIS — J441 Chronic obstructive pulmonary disease with (acute) exacerbation: Secondary | ICD-10-CM

## 2012-05-01 DIAGNOSIS — E1165 Type 2 diabetes mellitus with hyperglycemia: Secondary | ICD-10-CM

## 2012-05-01 LAB — T4, FREE: Free T4: 1.24 ng/dL (ref 0.80–1.80)

## 2012-05-01 LAB — LIPID PANEL
HDL: 64 mg/dL (ref >39)
LDL Cholesterol: 33 mg/dL (ref 0–99)
Triglycerides: 47 mg/dL (ref <150)
VLDL: 9 mg/dL (ref 0–40)

## 2012-05-01 LAB — BASIC METABOLIC PANEL
CO2: 27 mEq/L (ref 19–32)
Glucose, Bld: 224 mg/dL — ABNORMAL HIGH (ref 70–99)
Potassium: 4.3 mEq/L (ref 3.5–5.1)
Sodium: 140 mEq/L (ref 135–145)

## 2012-05-01 LAB — CBC
Hemoglobin: 9.6 g/dL — ABNORMAL LOW (ref 13.0–17.0)
MCH: 25.3 pg — ABNORMAL LOW (ref 26.0–34.0)
MCV: 83.1 fL (ref 78.0–100.0)
RBC: 3.79 MIL/uL — ABNORMAL LOW (ref 4.22–5.81)

## 2012-05-01 LAB — GLUCOSE, CAPILLARY
Glucose-Capillary: 243 mg/dL — ABNORMAL HIGH (ref 70–99)
Glucose-Capillary: 222 mg/dL — ABNORMAL HIGH (ref 70–99)
Glucose-Capillary: 285 mg/dL — ABNORMAL HIGH (ref 70–99)

## 2012-05-01 LAB — FERRITIN: Ferritin: 14 ng/mL — ABNORMAL LOW (ref 22–322)

## 2012-05-01 MED ORDER — POLYSACCHARIDE IRON COMPLEX 150 MG PO CAPS
150.0000 mg | ORAL_CAPSULE | Freq: Every day | ORAL | Status: DC
  Administered 2012-05-01 – 2012-05-03 (×3): 150 mg via ORAL
  Filled 2012-05-01 (×3): qty 1

## 2012-05-01 MED ORDER — METHYLPREDNISOLONE SODIUM SUCC 125 MG IJ SOLR
60.0000 mg | Freq: Three times a day (TID) | INTRAMUSCULAR | Status: DC
  Administered 2012-05-01 – 2012-05-02 (×2): 60 mg via INTRAVENOUS
  Filled 2012-05-01 (×2): qty 2

## 2012-05-01 MED ORDER — RIVAROXABAN 10 MG PO TABS: 20.0000 mg | ORAL_TABLET | Freq: Every day | ORAL | Status: DC

## 2012-05-01 MED ORDER — RIVAROXABAN 10 MG PO TABS
15.0000 mg | ORAL_TABLET | Freq: Every day | ORAL | Status: DC
  Administered 2012-05-01 – 2012-05-02 (×2): 15 mg via ORAL
  Filled 2012-05-01 (×2): qty 2

## 2012-05-01 MED ORDER — DILTIAZEM HCL ER COATED BEADS 120 MG PO CP24
120.0000 mg | ORAL_CAPSULE | Freq: Every day | ORAL | Status: DC
  Administered 2012-05-01 – 2012-05-03 (×3): 120 mg via ORAL
  Filled 2012-05-01 (×3): qty 1

## 2012-05-01 MED ORDER — RIVAROXABAN 10 MG PO TABS
15.0000 mg | ORAL_TABLET | Freq: Every day | ORAL | Status: DC
  Filled 2012-05-01: qty 2

## 2012-05-01 MED ORDER — SODIUM CHLORIDE 0.9 % IV SOLN
INTRAVENOUS | Status: DC
  Administered 2012-05-01 – 2012-05-03 (×3): via INTRAVENOUS

## 2012-05-01 MED ORDER — SODIUM CHLORIDE 0.9 % IJ SOLN
INTRAMUSCULAR | Status: AC
  Filled 2012-05-01: qty 3

## 2012-05-01 MED ORDER — LEVOFLOXACIN 250 MG PO TABS
250.0000 mg | ORAL_TABLET | Freq: Every day | ORAL | Status: DC
  Administered 2012-05-01 – 2012-05-03 (×3): 250 mg via ORAL
  Filled 2012-05-01 (×3): qty 1

## 2012-05-01 MED ORDER — CYANOCOBALAMIN 1000 MCG/ML IJ SOLN
1000.0000 ug | Freq: Every day | INTRAMUSCULAR | Status: AC
  Administered 2012-05-01 – 2012-05-03 (×3): 1000 ug via INTRAMUSCULAR
  Filled 2012-05-01 (×3): qty 1

## 2012-05-01 NOTE — Progress Notes (Signed)
Physical Therapy Treatment Patient Details Name: Brendan Hall MRN: [id number] DOB: 05/14/1930 Today's Date: 05/01/2012 Time: 1191-4782 PT Time Calculation (min): 16 min  PT Assessment / Plan / Recommendation Comments on Treatment Session  Patient did very well during gait training today. Used RW only for endurance purposes today and pt was able to complete 350' with Mod I. Patient moving to telemetry today. Use SPC for next session    Follow Up Recommendations       Barriers to Discharge        Equipment Recommendations       Recommendations for Other Services    Frequency     Plan      Precautions / Restrictions     Pertinent Vitals/Pain     Mobility  Transfers Sit to Stand: 7: Independent Stand to Sit: 7: Independent Ambulation/Gait Ambulation/Gait Assistance: 6: Modified independent (Device/Increase time) Ambulation Distance (Feet): 350 Feet Assistive device: Rolling walker (RW only used for endurance purposes today) Ambulation/Gait Assistance Details: verbal cues to slow down Gait Pattern: Within Functional Limits    Exercises Other Exercises Other Exercises: sit<>stand x10 for continued endurance    PT Diagnosis:    PT Problem List:   PT Treatment Interventions:     PT Goals Acute Rehab PT Goals PT Goal: Ambulate - Progress: Progressing toward goal  Visit Information  Last PT Received On: 05/01/12    Subjective Data      Cognition       Balance     End of Session PT - End of Session Equipment Utilized During Treatment: Gait belt Activity Tolerance: Patient tolerated treatment well Patient left: in chair;with call bell/phone within reach    Oliveah Zwack ATKINSO 05/01/2012, 9:56 AM

## 2012-05-01 NOTE — Progress Notes (Signed)
PHARMACIST - PHYSICIAN COMMUNICATION DR:   [facility] CONCERNING: Antibiotic IV to Oral Route Change Policy  RECOMMENDATION: This patient is receiving Levaquin by the intravenous route.  Based on criteria approved by the Pharmacy and Therapeutics Committee, the antibiotic(s) is/are being converted to the equivalent oral dose form(s).  DESCRIPTION: These criteria include:  Patient being treated for a respiratory tract infection, urinary tract infection, or cellulitis  The patient is not neutropenic and does not exhibit a GI malabsorption state  The patient is eating (either orally or via tube) and/or has been taking other orally administered medications for a least 24 hours  The patient is improving clinically and has a Tmax < 100.5  If you have questions about this conversion, please contact the Pharmacy Department  [x]  ( [phone] )  [facility] []  ( [phone] )  [facility]  []  ( [phone] )  [facility] []  ( [phone] )  [facility]   S. Kenita Bines, PharmD 

## 2012-05-01 NOTE — Progress Notes (Signed)
Subjective: Patient says that he is breathing a little bit better compared to yesterday. He has no chest pain or palpitations.  Objective: Vital signs in last 24 hours: Filed Vitals:   05/01/12 0200 05/01/12 0300 05/01/12 0400 05/01/12 0500  BP: 120/73 105/65 127/84   Pulse: 71 72 72 71  Temp:   98 F (36.7 C)   TempSrc:   Oral   Resp: 29 31 29 28   Height:      Weight:    74.3 kg (163 lb 12.8 oz)  SpO2: 90% 92% 92% 99%    Intake/Output Summary (Last 24 hours) at 05/01/12 1610 Last data filed at 05/01/12 0000  Gross per 24 hour  Intake   1493 ml  Output      0 ml  Net   1493 ml    Weight change: -1.904 kg (-4 lb 3.2 oz)  Physical exam: General: Elderly 78 year old after American man, sitting up in bed, in no acute distress. Lungs: No audible wheezes. Chronically mildly dyspneic. Heart: S1, S2, with occasional ectopy. Abdomen: Positive bowel sounds, soft, nontender, nondistended. Extremities: Trace of pedal edema bilaterally.  Lab Results: Basic Metabolic Panel:  Basename 05/01/12 0454 04/30/12 0511  NA 140 137  K 4.3 4.1  CL 102 100  CO2 27 25  GLUCOSE 224* 309*  BUN 29* 20  CREATININE 1.47* 1.14  CALCIUM 9.6 9.1  MG -- --  PHOS -- --   Liver Function Tests:  Basename 04/30/12 0511  AST 10  ALT 7  ALKPHOS 61  BILITOT 0.5  PROT 6.8  ALBUMIN 3.5   No results found for this basename: LIPASE:2,AMYLASE:2 in the last 72 hours No results found for this basename: AMMONIA:2 in the last 72 hours CBC:  Basename 05/01/12 0454 04/30/12 0511  WBC 8.0 6.0  NEUTROABS -- --  HGB 9.6* 10.3*  HCT 31.5* 33.8*  MCV 83.1 83.0  PLT 312 314   Cardiac Enzymes:  Basename 04/30/12 1610 04/30/12 0813 04/30/12 0021  CKTOTAL 149 114 130  CKMB 4.8* 4.5* 4.7*  CKMBINDEX -- -- --  TROPONINI <0.30 <0.30 <0.30   BNP:  Basename 04/30/12 0907  PROBNP 1787.0*   D-Dimer: No results found for this basename: DDIMER:2 in the last 72 hours CBG:  Basename 04/30/12 2202  04/30/12 1623 04/30/12 1206 04/30/12 0742  GLUCAP 151* 205* 135* 259*   Hemoglobin A1C:  Basename 04/30/12 0021  HGBA1C 7.5*   Fasting Lipid Panel:  Basename 05/01/12 0454  CHOL 106  HDL 64  LDLCALC 33  TRIG 47  CHOLHDL 1.7  LDLDIRECT --   Thyroid Function Tests:  Basename 04/30/12 0813 04/30/12 0021  TSH -- 1.479  T4TOTAL -- --  FREET4 1.24 --  T3FREE -- --  THYROIDAB -- --   Anemia Panel:  Basename 04/30/12 0813  VITAMINB12 216  FOLATE 12.6  FERRITIN 14*  TIBC 404  IRON 25*  RETICCTPCT --   Coagulation:  Basename 04/30/12 0511  LABPROT 14.8  INR 1.14   Urine Drug Screen: Drugs of Abuse  No results found for this basename: labopia,  cocainscrnur,  labbenz,  amphetmu,  thcu,  labbarb    Alcohol Level: No results found for this basename: ETH:2 in the last 72 hours Urinalysis: No results found for this basename: COLORURINE:2,APPERANCEUR:2,LABSPEC:2,PHURINE:2,GLUCOSEU:2,HGBUR:2,BILIRUBINUR:2,KETONESUR:2,PROTEINUR:2,UROBILINOGEN:2,NITRITE:2,LEUKOCYTESUR:2 in the last 72 hours Misc. Labs:  Micro: Recent Results (from the past 240 hour(s))  MRSA PCR SCREENING     Status: Normal   Collection Time   04/30/12 12:25 AM  Component Value Range Status Comment   MRSA by PCR NEGATIVE  NEGATIVE  Final     Studies/Results: Dg Chest 2 View  04/29/2012  *RADIOLOGY REPORT*  Clinical Data: Shortness of breath.  History of lung cancer.  CHEST - 2 VIEW  Comparison: Chest x-ray 11/14/2011.  Findings: Appearance of the right hemithorax suggests prior lobectomy (likely right upper lobectomy).  There is again an area of architectural distortion in the right suprahilar region, and chronic left to right shift of cardiomediastinal structures related to chronic right-sided volume loss.  Coarse interstitial markings are seen throughout the lungs bilaterally.  Lungs appear somewhat hyperexpanded with flattening of the hemidiaphragms, increased retrosternal air space and pruning of the  pulmonary vasculature in the periphery, compatible with underlying COPD.  No definite focal airspace consolidation on today's examination.  Blunting of the right costophrenic sulcus appears to be chronic and likely related to scarring (less likely, there could be a small right-sided pleural effusion).  No evidence of pulmonary edema.  Heart size is mildly enlarged (increased from prior).  The mediastinal contours are distorted by patient's postsurgical changes and positioning. Atherosclerotic calcifications within the thoracic aorta.  IMPRESSION: 1.  Chronic changes of COPD and postoperative changes related to prior right sided lobectomy (likely right upper lobectomy) again noted, without definite evidence of acute cardiopulmonary disease. 2.  Mild cardiomegaly, slightly increased compared to prior examination from 11/14/2011. 3.  Atherosclerosis.  Original Report Authenticated By: Florencia Reasons, M.D.    Medications: And Scheduled:    . aspirin EC  81 mg Oral Daily  . atorvastatin  20 mg Oral q1800  . diltiazem  30 mg Oral Q6H  . famotidine  20 mg Oral Daily  . Fluticasone-Salmeterol  1 puff Inhalation Q12H  . furosemide  40 mg Oral Daily  . heparin  5,000 Units Subcutaneous Q8H  . insulin aspart  0-20 Units Subcutaneous TID WC  . insulin aspart  0-5 Units Subcutaneous QHS  . insulin glargine  15 Units Subcutaneous BID  . ipratropium  0.5 mg Nebulization Q4H  . levalbuterol  0.63 mg Nebulization Q4H  . levofloxacin (LEVAQUIN) IV  250 mg Intravenous Q24H  . lisinopril  2.5 mg Oral Daily  . methylPREDNISolone (SOLU-MEDROL) injection  80 mg Intravenous Q6H  . sodium chloride  3 mL Intravenous Q12H  . sodium chloride      . Tamsulosin HCl  0.4 mg Oral Daily  . DISCONTD: simvastatin  40 mg Oral q1800   Continuous:    . DISCONTD: diltiazem (CARDIZEM) infusion 5 mg/hr (04/30/12 0800)   BJY:[id number], acetaminophen, diltiazem, levalbuterol, ondansetron (ZOFRAN) IV,  ondansetron  Assessment: Principal Problem:  *Acute exacerbation of chronic obstructive pulmonary disease (COPD) Active Problems:  Anemia  DM type 2 (diabetes mellitus, type 2)  Hypercholesteremia  History of lung cancer  Acute respiratory failure  Atrial flutter with rapid ventricular response  Acute renal insufficiency   1. COPD, oxygen dependent, with acute exacerbation in the setting of chronic hypoxic respiratory failure. We'll continue Levaquin, Solu-Medrol, bronchodilator therapy, and oxygen, but will decrease the dosing of Solu-Medrol given fewer bronchospasms.  Newly diagnosed/new onset atrial flutter with rapid ventricular response. No recent known history of atrial flutter. Symptomatology may be secondary to COPD with acute exacerbation. His ejection fraction is 55%. His TSH and free T4 are within normal limits. His fasting lipid panel is within normal limits. He is on oral Cardizem now per cardiology. Awaiting their decision on starting anticoagulation. The patient is equivocal about  starting anticoagulation.  Development of acute renal insufficiency. This may be in part secondary to hyperglycemia and chronic Lasix therapy. Of note, an ACE inhibitor was started yesterday by cardiology.  Type 2 diabetes mellitus, uncontrolled. Uncontrolled in part secondary to steroids. Sliding scale NovoLog and Lantus have been adjusted accordingly. He is on metformin chronically, but it was held yesterday. We'll continue to hold it because of mild renal insufficiency developed since yesterday. His hemoglobin A1c is 7.5.  Normocytic anemia. Anemia panel significant for iron deficiency and borderline vitamin B12 deficiency. We'll treat accordingly.  Elevated alkaline phosphatase. This is noted and will be monitored accordingly.   Plan: 1. Will discuss anticoagulation versus antiplatelet therapy with cardiology. 2. We'll change Cardizem to 120 mg once daily rather than every 6 hour dosing.  Further recommendations per cardiology. 3. We'll decrease the dose of Solu-Medrol. This will eventually improve his diabetes control. 4. Will start gentle IV fluids in light of an increase in creatinine and BUN and elevated sugars. 5. Consider discontinuation of ACE inhibitor given the patient's preserved left ventricular systolic function. This will be deferred to cardiology. 6. We'll start iron supplementation and vitamin B12 injections. 7. Transfer to telemetry.    Total ICU time 40 minutes.   LOS: 2 days   Brendan Hall 05/01/2012, 8:12 AM

## 2012-05-01 NOTE — Progress Notes (Addendum)
SUBJECTIVE:Feeling better. No shortness of breath, no chest pain  Principal Problem:  *Acute exacerbation of chronic obstructive pulmonary disease (COPD) Active Problems:  Iron deficiency anemia  DM type 2 (diabetes mellitus, type 2)  Hypercholesteremia  History of lung cancer  Acute respiratory failure  Atrial flutter with rapid ventricular response  Acute renal insufficiency  LABS: Basic Metabolic Panel:  Basename 05/01/12 0454 04/30/12 0511  NA 140 137  K 4.3 4.1  CL 102 100  CO2 27 25  GLUCOSE 224* 309*  BUN 29* 20  CREATININE 1.47* 1.14  CALCIUM 9.6 9.1  MG -- --  PHOS -- --   Liver Function Tests:  Basename 04/30/12 0511  AST 10  ALT 7  ALKPHOS 61  BILITOT 0.5  PROT 6.8  ALBUMIN 3.5   No results found for this basename: LIPASE:2,AMYLASE:2 in the last 72 hours CBC:  Basename 05/01/12 0454 04/30/12 0511  WBC 8.0 6.0  NEUTROABS -- --  HGB 9.6* 10.3*  HCT 31.5* 33.8*  MCV 83.1 83.0  PLT 312 314   Cardiac Enzymes:  Basename 04/30/12 1610 04/30/12 0813 04/30/12 0021  CKTOTAL 149 114 130  CKMB 4.8* 4.5* 4.7*  CKMBINDEX -- -- --  TROPONINI <0.30 <0.30 <0.30   Hemoglobin A1C:  Basename 04/30/12 0021  HGBA1C 7.5*   Fasting Lipid Panel:  Basename 05/01/12 0454  CHOL 106  HDL 64  LDLCALC 33  TRIG 47  CHOLHDL 1.7  LDLDIRECT --   Thyroid Function Tests:  Basename 04/30/12 0021  TSH 1.479  T4TOTAL --  T3FREE --  THYROIDAB --    RADIOLOGY: Dg Chest 2 View  04/29/2012  *RADIOLOGY REPORT*  Clinical Data: Shortness of breath.  History of lung cancer.  CHEST - 2 VIEW  Comparison: Chest x-ray 11/14/2011.  Marland Kitchen  IMPRESSION: 1.  Chronic changes of COPD and postoperative changes related to prior right sided lobectomy (likely right upper lobectomy) again noted, without definite evidence of acute cardiopulmonary disease. 2.  Mild cardiomegaly, slightly increased compared to prior examination from 11/14/2011. 3.  Atherosclerosis.  Original Report  Authenticated By: Florencia Reasons, M.D.   Echocardiogram: 04/30/2012 The study was technically difficult, as a result of poor sound wave transmission. - Left ventricle: The cavity size was normal. Wall thickness was increased in a pattern of mild LVH. The estimated ejection fraction was approximately 55% based on limited views. Images were inadequate for LV wall motion assessment. The study is not technically sufficient to allow evaluation of LV diastolic function. - Mitral valve: Calcified annulus. Mildly thickened leaflets . Trivial regurgitation. - Atrial septum: The interatrial septum was hypermobile. - Tricuspid valve: Mild regurgitation. Peak gradient: 14mm Hg (D). - Pericardium, extracardiac: There was no pericardial effusion.    PHYSICAL EXAM BP 127/84  Pulse 71  Temp(Src) 98 F (36.7 C) (Oral)  Resp 28  Ht 5\' 8"  (1.727 m)  Wt 163 lb 12.8 oz (74.3 kg)  BMI 24.91 kg/m2  SpO2 99% General: Well developed, well nourished, in no acute distress Head: Eyes PERRLA, No xanthomas.   Normal cephalic and atramatic  Lungs: Clear bilaterally to auscultation Diminished in RUL. Heart: HRIR S1 S2, No MRG .  Pulses are 2+ & equal.            No carotid bruit. No JVD.  No abdominal bruits. No femoral bruits. Abdomen: Bowel sounds are positive, abdomen soft and non-tender without masses Msk:  Back normal, normal gait. Normal strength and tone for age. Extremities: No clubbing, cyanosis or  edema.  DP +1 Neuro: Alert and oriented X 3. Psych:  Good affect, responds appropriately  TELEMETRY: Reviewed telemetry pt in: Atrial flutter, with 3:1 block rate in the 70's.  ASSESSMENT AND PLAN:  1. Atrial flutter: Now changed to long acting diltiazem at 120mg  daily. Rate is well controlled. I have discussed anticoagulation with the patient to include novel anticoagulant and coumadin including risks and benefits. He is willing to start Xaralto. Will begin at 15 mg daily. Creatinine clearance  42. Discussion with Dr. Anyra Kaufman/McDowell on consideration for DCCV at later date. BNP 1787. Remains on lasix 40 mg daily. Creatinine is 1.43.  2. COPD: Improved breathing status. He has history of lung CA with RUL lobectomy. He is continued on  Inhalers and steroids. He will move out of ICU and ambulate.  3. Hypertension: Blood pressure is well controlled on CCB and addition of low dose lisinopril 2.5 mg. Monitor renal status. May need to d/c if this becomes an issue.   Bettey Mare. Lyman Bishop NP [facility] 05/01/2012, 8:25 AM  Cardiology Attending Patient interviewed and examined. Discussed with Joni Reining, NP.  Above note annotated and modified based upon my findings.  Atrial flutter newly diagnosed in the setting of a COPD exacerbation. Heart rate is well controlled and anticoagulation initiated.  There is a reasonable chance that he will convert spontaneously to sinus rhythm. He is not inclined to undergo DC cardioversion. If atrial arrhythmias process, pharmacologic conversion will be a consideration in a few weeks. EKG will be reassessed in the morning. For now, anticoagulation and rate control are adequate.  We will monitor anticoagulation as an outpatient and reassess in the office in a few weeks.  Discontinue aspirin.    There has been a sudden increase in creatinine without any change in his usual dose of furosemide. This could represent prerenal azotemia from an effective cardiac output related to his arrhythmia. It would be a rapid response to a small dose of ACE inhibitor, but toxicity from that agent is certainly a possibility. Renal function should be monitored closely.  [place] Bing, MD 05/01/2012, 7:35 PM

## 2012-05-01 NOTE — Progress Notes (Signed)
Report given to [facility], RN pt alert, oriented and in stable condition at the time of transport. Pt being transported by wheelchair to room 315 by NT.

## 2012-05-01 NOTE — Progress Notes (Signed)
Discussed Xarelto Rx with Joni Reining NP.  Appropriate dose is 15mg  po daily due to clcr < 50 ml/min (range is 15-62ml/min for 15mg  and IF > 50 ml/min then 20mg  daily).  Scharlene Gloss, PharmD

## 2012-05-01 NOTE — Care Management Note (Unsigned)
    Page 1 of 1   05/01/2012     4:30:05 PM   CARE MANAGEMENT NOTE 05/01/2012  Patient:  KOSTANTINOS, TALLMAN   Account Number:  [id number]  Date Initiated:  05/01/2012  Documentation initiated by:  Rosemary Holms  Subjective/Objective Assessment:   Pt admitted with COPD, atrial flutter with RVR. Lives at home with spouse. Has chronic O2 at home.     Action/Plan:   Spoke to pt at bedside. Pt states he will not need HH services or CM services.   Anticipated DC Date:  05/03/2012   Anticipated DC Plan:  HOME/SELF CARE      DC Planning Services  CM consult      Choice offered to / List presented to:             Status of service:  In process, will continue to follow Medicare Important Message given?   (If response is "NO", the following Medicare IM given date fields will be blank) Date Medicare IM given:   Date Additional Medicare IM given:    Discharge Disposition:    Per UR Regulation:    If discussed at Long Length of Stay Meetings, dates discussed:    Comments:  05/01/12 1630 Manfred Laspina Leanord Hawking RN BSN CM

## 2012-05-01 NOTE — Progress Notes (Signed)
Inpatient Diabetes Program Recommendations  AACE/ADA: New Consensus Statement on Inpatient Glycemic Control (2009)  Target Ranges:  Prepandial:   less than 140 mg/dL      Peak postprandial:   less than 180 mg/dL (1-2 hours)      Critically ill patients:  140 - 180 mg/dL    Results for HELIODORO, DOMAGALSKI (MRN [id number]) as of 05/01/2012 14:04  Ref. Range 04/30/2012 07:42 04/30/2012 12:06 04/30/2012 16:23 04/30/2012 22:02  Glucose-Capillary Latest Range: 70-99 mg/dL 119 (H) 147 (H) 829 (H) 151 (H)    Results for ZIAIR, PENSON (MRN [id number]) as of 05/01/2012 14:04  Ref. Range 05/01/2012 08:09 05/01/2012 12:20  Glucose-Capillary Latest Range: 70-99 mg/dL 784 (H) 696 (H)   Noted patient on IV steroids.  Steroids decreased slightly today.  Hopefully his CBGs will improve as steroids decreased.  Inpatient Diabetes Program Recommendations Insulin - Basal: Please consider increasing Lantus to 18 units bid if patient's fasting CBG stays elevated while on IV steroids.  Note: Will follow. Ambrose Finland RN, MSN, CDE Diabetes Coordinator Inpatient Diabetes Program [phone]

## 2012-05-02 ENCOUNTER — Other Ambulatory Visit: Payer: Self-pay

## 2012-05-02 DIAGNOSIS — E1165 Type 2 diabetes mellitus with hyperglycemia: Secondary | ICD-10-CM

## 2012-05-02 DIAGNOSIS — N179 Acute kidney failure, unspecified: Secondary | ICD-10-CM

## 2012-05-02 DIAGNOSIS — J441 Chronic obstructive pulmonary disease with (acute) exacerbation: Secondary | ICD-10-CM

## 2012-05-02 DIAGNOSIS — I4892 Unspecified atrial flutter: Secondary | ICD-10-CM

## 2012-05-02 LAB — BASIC METABOLIC PANEL
BUN: 35 mg/dL — ABNORMAL HIGH (ref 6–23)
CO2: 28 mEq/L (ref 19–32)
Chloride: 104 mEq/L (ref 96–112)
Creatinine, Ser: 1.54 mg/dL — ABNORMAL HIGH (ref 0.50–1.35)

## 2012-05-02 LAB — GLUCOSE, CAPILLARY
Glucose-Capillary: 158 mg/dL — ABNORMAL HIGH (ref 70–99)
Glucose-Capillary: 114 mg/dL — ABNORMAL HIGH (ref 70–99)

## 2012-05-02 MED ORDER — INSULIN GLARGINE 100 UNIT/ML ~~LOC~~ SOLN: 10.0000 [IU] | Freq: Two times a day (BID) | SUBCUTANEOUS | Status: DC

## 2012-05-02 MED ORDER — METHYLPREDNISOLONE SODIUM SUCC 125 MG IJ SOLR
60.0000 mg | Freq: Two times a day (BID) | INTRAMUSCULAR | Status: DC
  Filled 2012-05-02: qty 2

## 2012-05-02 MED ORDER — FLUTICASONE PROPIONATE 50 MCG/ACT NA SUSP
NASAL | Status: AC
  Filled 2012-05-02: qty 16

## 2012-05-02 MED ORDER — FLUTICASONE PROPIONATE 50 MCG/ACT NA SUSP
1.0000 | Freq: Every day | NASAL | Status: DC
  Administered 2012-05-02: 1 via NASAL
  Filled 2012-05-02: qty 16

## 2012-05-02 NOTE — Progress Notes (Signed)
Subjective: Patient says that he is getting a little better each day. No complaints of chest pain or palpitations.  Objective: Vital signs in last 24 hours: Filed Vitals:   05/01/12 2332 05/02/12 0524 05/02/12 0855 05/02/12 1248  BP:  145/79    Pulse:  74    Temp:  97.9 F (36.6 C)    TempSrc:  Oral    Resp:  20    Height:      Weight:      SpO2: 97% 96% 96% 96%    Intake/Output Summary (Last 24 hours) at 05/02/12 1442 Last data filed at 05/02/12 0800  Gross per 24 hour  Intake 1084.33 ml  Output    450 ml  Net 634.33 ml    Weight change:   Physical exam: General: Elderly 78 year old after American man, sitting up in bed, in no acute distress. Lungs: Rare wheezes. Chronically mildly dyspneic. Heart: S1, S2, with occasional ectopy. Abdomen: Positive bowel sounds, soft, nontender, nondistended. Extremities: Trace of pedal edema bilaterally.  Lab Results: Basic Metabolic Panel:  Basename 05/02/12 0629 05/01/12 0454  NA 142 140  K 4.4 4.3  CL 104 102  CO2 28 27  GLUCOSE 167* 224*  BUN 35* 29*  CREATININE 1.54* 1.47*  CALCIUM 9.7 9.6  MG -- --  PHOS -- --   Liver Function Tests:  Basename 04/30/12 0511  AST 10  ALT 7  ALKPHOS 61  BILITOT 0.5  PROT 6.8  ALBUMIN 3.5   No results found for this basename: LIPASE:2,AMYLASE:2 in the last 72 hours No results found for this basename: AMMONIA:2 in the last 72 hours CBC:  Basename 05/01/12 0454 04/30/12 0511  WBC 8.0 6.0  NEUTROABS -- --  HGB 9.6* 10.3*  HCT 31.5* 33.8*  MCV 83.1 83.0  PLT 312 314   Cardiac Enzymes:  Basename 04/30/12 1610 04/30/12 0813 04/30/12 0021  CKTOTAL 149 114 130  CKMB 4.8* 4.5* 4.7*  CKMBINDEX -- -- --  TROPONINI <0.30 <0.30 <0.30   BNP:  [facility] 04/30/12 0907  PROBNP 1787.0*   D-Dimer: No results found for this basename: DDIMER:2 in the last 72 hours CBG:  Basename 05/02/12 1130 05/02/12 0732 05/01/12 2112 05/01/12 1615 05/01/12 1220 05/01/12 0809  GLUCAP 180* 158*  285* 155* 222* 243*   Hemoglobin A1C:  Basename 04/30/12 0021  HGBA1C 7.5*   Fasting Lipid Panel:  Basename 05/01/12 0454  CHOL 106  HDL 64  LDLCALC 33  TRIG 47  CHOLHDL 1.7  LDLDIRECT --   Thyroid Function Tests:  Basename 04/30/12 0813 04/30/12 0021  TSH -- 1.479  T4TOTAL -- --  FREET4 1.24 --  T3FREE -- --  THYROIDAB -- --   Anemia Panel:  Basename 04/30/12 0813  VITAMINB12 216  FOLATE 12.6  FERRITIN 14*  TIBC 404  IRON 25*  RETICCTPCT --   Coagulation:  Basename 05/02/12 0629 04/30/12 0511  LABPROT 25.2* 14.8  INR 2.24* 1.14   Urine Drug Screen: Drugs of Abuse  No results found for this basename: labopia,  cocainscrnur,  labbenz,  amphetmu,  thcu,  labbarb    Alcohol Level: No results found for this basename: ETH:2 in the last 72 hours Urinalysis: No results found for this basename: COLORURINE:2,APPERANCEUR:2,LABSPEC:2,PHURINE:2,GLUCOSEU:2,HGBUR:2,BILIRUBINUR:2,KETONESUR:2,PROTEINUR:2,UROBILINOGEN:2,NITRITE:2,LEUKOCYTESUR:2 in the last 72 hours Misc. Labs:  Echocardiogram: Study Conclusions  - Procedure narrative: Transthoracic echocardiography. Image quality was poor. The study was technically difficult, as a result of poor sound wave transmission. - Left ventricle: The cavity size was normal. Wall thickness was  increased in a pattern of mild LVH. The estimated ejection fraction was approximately 55% based on limited views. Images were inadequate for LV wall motion assessment. The study is not technically sufficient to allow evaluation of LV diastolic function. - Mitral valve: Calcified annulus. Mildly thickened leaflets . Trivial regurgitation. - Atrial septum: The interatrial septum was hypermobile. - Tricuspid valve: Mild regurgitation. Peak gradient: 14mm Hg (D). - Pericardium, extracardiac: There was no pericardial effusion. Transthoracic echocardiography. M-mode, complete 2D, spectral Doppler, and color Doppler. Height:  Height: 172.7cm. Height: 68in. Weight: Weight: 76.2kg. Weight: 167.7lb. Body mass index: BMI: 25.5kg/m^2. Body surface area: BSA: 1.37m^2. Patient status: Inpatient. Location: Bedside.     Micro: Recent Results (from the past 240 hour(s))  MRSA PCR SCREENING     Status: Normal   Collection Time   04/30/12 12:25 AM      Component Value Range Status Comment   MRSA by PCR NEGATIVE  NEGATIVE  Final     Studies/Results: No results found.  Medications: And Scheduled:    . atorvastatin  20 mg Oral q1800  . cyanocobalamin  1,000 mcg Intramuscular Daily  . diltiazem  120 mg Oral Daily  . famotidine  20 mg Oral Daily  . Fluticasone-Salmeterol  1 puff Inhalation Q12H  . furosemide  40 mg Oral Daily  . insulin aspart  0-20 Units Subcutaneous TID WC  . insulin aspart  0-5 Units Subcutaneous QHS  . insulin glargine  15 Units Subcutaneous BID  . ipratropium  0.5 mg Nebulization Q4H  . iron polysaccharides  150 mg Oral Daily  . levalbuterol  0.63 mg Nebulization Q4H  . levofloxacin  250 mg Oral Daily  . methylPREDNISolone (SOLU-MEDROL) injection  60 mg Intravenous Q8H  . rivaroxaban  15 mg Oral Q supper  . sodium chloride  3 mL Intravenous Q12H  . sodium chloride      . Tamsulosin HCl  0.4 mg Oral Daily  . DISCONTD: aspirin EC  81 mg Oral Daily  . DISCONTD: lisinopril  2.5 mg Oral Daily   Continuous:    . sodium chloride 70 mL/hr at 05/02/12 1148   ZOX:[id number], acetaminophen, levalbuterol, ondansetron (ZOFRAN) IV, ondansetron  Assessment: Principal Problem:  *Acute exacerbation of chronic obstructive pulmonary disease (COPD) Active Problems:  Iron deficiency anemia  DM type 2 (diabetes mellitus, type 2)  Hypercholesteremia  History of lung cancer  Acute respiratory failure  Atrial flutter with rapid ventricular response  Acute renal insufficiency   1. COPD, oxygen dependent, with acute exacerbation in the setting of chronic hypoxic respiratory failure. We'll  continue Levaquin, Solu-Medrol, bronchodilator therapy, and oxygen, but will decrease the dosing of Solu-Medrol given fewer bronchospasms.  Newly diagnosed/new onset atrial flutter with rapid ventricular response. No recent known history of atrial flutter. Symptomatology may be secondary to COPD with acute exacerbation. His ejection fraction is 55%. His TSH and free T4 are within normal limits. His fasting lipid panel is within normal limits. He is on oral Cardizem now per cardiology. Lesia Hausen was started by cardiology yesterday.  Development of acute renal insufficiency. This may be in part secondary to hyperglycemia and chronic Lasix therapy. Of note, an ACE inhibitor was started yesterday by cardiology, but is unclear if this is contributing to the patient's increasing creatinine. Nevertheless, he will be discontinued and his renal function will be monitored.  Type 2 diabetes mellitus, uncontrolled. Uncontrolled in part secondary to steroids. Sliding scale NovoLog and Lantus have been adjusted accordingly. He is on metformin chronically, but it  was held yesterday. We'll continue to hold it because of mild renal insufficiency developed since yesterday. His hemoglobin A1c is 7.5. The diabetes coordinator/registered nurse recommendations noted, but with the tapering of steroids, he may not need additional increase in Lantus.  Normocytic anemia. Anemia panel significant for iron deficiency and borderline vitamin B12 deficiency. Supplementation started.  Elevated alkaline phosphatase. This is noted and will be monitored accordingly.   Plan: 1. continue gentle IV fluids. 2. Decrease the dosing of Solu-Medrol. 3. Discontinue lisinopril given preserved LV function. 4. Possible discharge tomorrow.    Total ICU time 40 minutes.   LOS: 3 days   Symphani Eckstrom 05/02/2012, 2:42 PM

## 2012-05-03 ENCOUNTER — Encounter (HOSPITAL_COMMUNITY): Payer: Self-pay | Admitting: Internal Medicine

## 2012-05-03 DIAGNOSIS — I4892 Unspecified atrial flutter: Secondary | ICD-10-CM

## 2012-05-03 DIAGNOSIS — R112 Nausea with vomiting, unspecified: Secondary | ICD-10-CM

## 2012-05-03 DIAGNOSIS — E1165 Type 2 diabetes mellitus with hyperglycemia: Secondary | ICD-10-CM

## 2012-05-03 DIAGNOSIS — J441 Chronic obstructive pulmonary disease with (acute) exacerbation: Secondary | ICD-10-CM

## 2012-05-03 LAB — BASIC METABOLIC PANEL
CO2: 30 mEq/L (ref 19–32)
Calcium: 9.4 mg/dL (ref 8.4–10.5)
Chloride: 99 mEq/L (ref 96–112)
Sodium: 136 mEq/L (ref 135–145)

## 2012-05-03 LAB — GLUCOSE, CAPILLARY: Glucose-Capillary: 156 mg/dL — ABNORMAL HIGH (ref 70–99)

## 2012-05-03 MED ORDER — LEVOFLOXACIN 250 MG PO TABS: 250.0000 mg | ORAL_TABLET | Freq: Every day | ORAL | Status: DC

## 2012-05-03 MED ORDER — RIVAROXABAN 15 MG PO TABS: 15.0000 mg | ORAL_TABLET | Freq: Every day | ORAL | Status: DC

## 2012-05-03 MED ORDER — RIVAROXABAN 20 MG PO TABS: 15.0000 mg | ORAL_TABLET | Freq: Every day | ORAL | Status: DC

## 2012-05-03 MED ORDER — FAMOTIDINE 20 MG PO TABS: 20.0000 mg | ORAL_TABLET | Freq: Every day | ORAL | Status: DC

## 2012-05-03 MED ORDER — RIVAROXABAN 20 MG PO TABS: 20.0000 mg | ORAL_TABLET | Freq: Every day | ORAL | Status: DC

## 2012-05-03 MED ORDER — DILTIAZEM HCL 60 MG PO TABS
60.0000 mg | ORAL_TABLET | Freq: Once | ORAL | Status: AC
  Administered 2012-05-03: 60 mg via ORAL
  Filled 2012-05-03: qty 1

## 2012-05-03 MED ORDER — DILTIAZEM HCL ER COATED BEADS 180 MG PO CP24: 180.0000 mg | ORAL_CAPSULE | Freq: Every day | ORAL | Status: DC

## 2012-05-03 MED ORDER — CYANOCOBALAMIN 1000 MCG PO TABS: 1000.0000 ug | ORAL_TABLET | Freq: Every day | ORAL | Status: DC

## 2012-05-03 MED ORDER — IPRATROPIUM-ALBUTEROL 0.5-2.5 (3) MG/3ML IN SOLN: 3.0000 mL | Freq: Four times a day (QID) | RESPIRATORY_TRACT | Status: DC

## 2012-05-03 MED ORDER — PREDNISONE 10 MG PO TABS: ORAL_TABLET | ORAL | Status: DC

## 2012-05-03 MED ORDER — POLYSACCHARIDE IRON COMPLEX 150 MG PO CAPS: 150.0000 mg | ORAL_CAPSULE | Freq: Every day | ORAL | Status: DC

## 2012-05-03 NOTE — Discharge Summary (Addendum)
Physician Discharge Summary  Brendan Hall MRN: [id number] DOB/AGE: 07/01/1930 78 y.o.  PCP: Colon Branch, MD, MD   Admit date: 04/29/2012 Discharge date: 05/03/2012  Discharge Diagnoses:  1. Oxygen-dependent COPD with acute exacerbation/acute on chronic respiratory failure.. 2. Newly diagnosed atrial flutter with rapid ventricular response. 3. Mild acute renal insufficiency, resolved. The patient's creatinine was 1.29 at the time of discharge. 4. Iron deficiency anemia, with borderline vitamin B12 deficiency. The patient anemia panel revealed a total iron of 25, TIBC of 404, ferritin of 14, folate of 12.6, and vitamin B12 of 216. Hemoglobin 9.6. 5. History of lung cancer. 6. Hypertension. 7. Hypercholesterolemia. 8. History of colon polyps status post ablation in October 2012, per Dr. Karilyn Cota. 9. Type 2 diabetes mellitus. Hemoglobin A1c 7.5.    Medication List  As of 05/03/2012 11:42 AM   STOP taking these medications         aspirin EC 81 MG tablet         TAKE these medications         cyanocobalamin 1000 MCG tablet   Take 1 tablet (1,000 mcg total) by mouth daily.      diltiazem 180 MG 24 hr capsule   Commonly known as: CARDIZEM CD   Take 1 capsule (180 mg total) by mouth daily. New medication for your heart rate and your blood pressure.      famotidine 20 MG tablet   Commonly known as: PEPCID   Take 1 tablet (20 mg total) by mouth daily.      Fluticasone-Salmeterol 250-50 MCG/DOSE Aepb   Commonly known as: ADVAIR   Inhale 1 puff into the lungs every 12 (twelve) hours.      furosemide 40 MG tablet   Commonly known as: LASIX   Take 40 mg by mouth daily.      ipratropium-albuterol 0.5-2.5 (3) MG/3ML Soln   Commonly known as: DUONEB   Take 3 mLs by nebulization 4 (four) times daily.      iron polysaccharides 150 MG capsule   Commonly known as: NIFEREX   Take 1 capsule (150 mg total) by mouth daily. Iron supplementation for your anemia.     levofloxacin 250 MG tablet   Commonly known as: LEVAQUIN   Take 1 tablet (250 mg total) by mouth daily. Antibiotic.      metFORMIN 1000 MG tablet   Commonly known as: GLUCOPHAGE   Take 1,000 mg by mouth 2 (two) times daily.      mometasone 50 MCG/ACT nasal spray   Commonly known as: NASONEX   Place 2 sprays into the nose daily.      predniSONE 10 MG tablet   Commonly known as: DELTASONE   STARTING TOMORROW, TAKE 6 TABLETS FOR 1 DAY; THEN 5 TABLETS NEXT DAY; THEN 4 TABLETS THE NEXT DAY; THEN 3 TABLETS NEXT DAY; THEN 2 TABLETS NEXT DAY; THEN 1 TABLET THE NEXT DAY; THEN STOP.      Rivaroxaban 15 MG Tabs tablet   Commonly known as: XARELTO   Take 1 tablet (15 mg total) by mouth daily.      simvastatin 40 MG tablet   Commonly known as: ZOCOR   Take 40 mg by mouth daily.      Tamsulosin HCl 0.4 MG Caps   Commonly known as: FLOMAX   Take 0.4 mg by mouth daily.      VENTOLIN HFA 108 (90 BASE) MCG/ACT inhaler   Generic drug: albuterol   Inhale 2 puffs into the  lungs every 6 (six) hours as needed.            Discharge Condition: Home.  Disposition: 06-Home-Health Care Svc   Consults: Nona Dell, M.D. and Valencia Bing, M.D.   Significant Diagnostic Studies: Dg Chest 2 View  04/29/2012  *RADIOLOGY REPORT*  Clinical Data: Shortness of breath.  History of lung cancer.  CHEST - 2 VIEW  Comparison: Chest x-ray 11/14/2011.  Findings: Appearance of the right hemithorax suggests prior lobectomy (likely right upper lobectomy).  There is again an area of architectural distortion in the right suprahilar region, and chronic left to right shift of cardiomediastinal structures related to chronic right-sided volume loss.  Coarse interstitial markings are seen throughout the lungs bilaterally.  Lungs appear somewhat hyperexpanded with flattening of the hemidiaphragms, increased retrosternal air space and pruning of the pulmonary vasculature in the periphery, compatible with underlying  COPD.  No definite focal airspace consolidation on today's examination.  Blunting of the right costophrenic sulcus appears to be chronic and likely related to scarring (less likely, there could be a small right-sided pleural effusion).  No evidence of pulmonary edema.  Heart size is mildly enlarged (increased from prior).  The mediastinal contours are distorted by patient's postsurgical changes and positioning. Atherosclerotic calcifications within the thoracic aorta.  IMPRESSION: 1.  Chronic changes of COPD and postoperative changes related to prior right sided lobectomy (likely right upper lobectomy) again noted, without definite evidence of acute cardiopulmonary disease. 2.  Mild cardiomegaly, slightly increased compared to prior examination from 11/14/2011. 3.  Atherosclerosis.  Original Report Authenticated By: Florencia Reasons, M.D.   ECHO:Study Conclusions  - Procedure narrative: Transthoracic echocardiography. Image quality was poor. The study was technically difficult, as a result of poor sound wave transmission. - Left ventricle: The cavity size was normal. Wall thickness was increased in a pattern of mild LVH. The estimated ejection fraction was approximately 55% based on limited views. Images were inadequate for LV wall motion assessment. The study is not technically sufficient to allow evaluation of LV diastolic function. - Mitral valve: Calcified annulus. Mildly thickened leaflets . Trivial regurgitation. - Atrial septum: The interatrial septum was hypermobile. - Tricuspid valve: Mild regurgitation. Peak gradient: 14mm Hg (D). - Pericardium, extracardiac: There was no pericardial effusion. Transthoracic echocardiography. M-mode, complete 2D, spectral Doppler, and color Doppler. Height: Height: 172.7cm. Height: 68in. Weight: Weight: 76.2kg. Weight: 167.7lb. Body mass index: BMI: 25.5kg/m^2. Body surface area: BSA: 1.50m^2. Patient status: Inpatient. Location:  Bedside.     Microbiology: Recent Results (from the past 240 hour(s))  MRSA PCR SCREENING     Status: Normal   Collection Time   04/30/12 12:25 AM      Component Value Range Status Comment   MRSA by PCR NEGATIVE  NEGATIVE  Final      Labs: Results for orders placed during the hospital encounter of 04/29/12 (from the past 48 hour(s))  GLUCOSE, CAPILLARY     Status: Abnormal   Collection Time   05/01/12 12:20 PM      Component Value Range Comment   Glucose-Capillary 222 (*) 70 - 99 (mg/dL)    Comment 1 Documented in Chart      Comment 2 Notify RN     GLUCOSE, CAPILLARY     Status: Abnormal   Collection Time   05/01/12  4:15 PM      Component Value Range Comment   Glucose-Capillary 155 (*) 70 - 99 (mg/dL)    Comment 1 Documented in Chart  Comment 2 Notify RN     GLUCOSE, CAPILLARY     Status: Abnormal   Collection Time   05/01/12  9:12 PM      Component Value Range Comment   Glucose-Capillary 285 (*) 70 - 99 (mg/dL)    Comment 1 Documented in Chart      Comment 2 Notify RN     BASIC METABOLIC PANEL     Status: Abnormal   Collection Time   05/02/12  6:29 AM      Component Value Range Comment   Sodium 142  135 - 145 (mEq/L)    Potassium 4.4  3.5 - 5.1 (mEq/L)    Chloride 104  96 - 112 (mEq/L)    CO2 28  19 - 32 (mEq/L)    Glucose, Bld 167 (*) 70 - 99 (mg/dL)    BUN 35 (*) 6 - 23 (mg/dL)    Creatinine, Ser 1.61 (*) 0.50 - 1.35 (mg/dL)    Calcium 9.7  8.4 - 10.5 (mg/dL)    GFR calc non Af Amer 41 (*) >90 (mL/min)    GFR calc Af Amer 47 (*) >90 (mL/min)   PROTIME-INR     Status: Abnormal   Collection Time   05/02/12  6:29 AM      Component Value Range Comment   Prothrombin Time 25.2 (*) 11.6 - 15.2 (seconds)    INR 2.24 (*) 0.00 - 1.49    APTT     Status: Normal   Collection Time   05/02/12  6:29 AM      Component Value Range Comment   aPTT 33  24 - 37 (seconds)   GLUCOSE, CAPILLARY     Status: Abnormal   Collection Time   05/02/12  7:32 AM      Component Value Range  Comment   Glucose-Capillary 158 (*) 70 - 99 (mg/dL)    Comment 1 Documented in Chart      Comment 2 Notify RN     GLUCOSE, CAPILLARY     Status: Abnormal   Collection Time   05/02/12 11:30 AM      Component Value Range Comment   Glucose-Capillary 180 (*) 70 - 99 (mg/dL)    Comment 1 Documented in Chart      Comment 2 Notify RN     GLUCOSE, CAPILLARY     Status: Abnormal   Collection Time   05/02/12  4:01 PM      Component Value Range Comment   Glucose-Capillary 270 (*) 70 - 99 (mg/dL)    Comment 1 Documented in Chart      Comment 2 Notify RN     GLUCOSE, CAPILLARY     Status: Abnormal   Collection Time   05/02/12 10:04 PM      Component Value Range Comment   Glucose-Capillary 114 (*) 70 - 99 (mg/dL)   BASIC METABOLIC PANEL     Status: Abnormal   Collection Time   05/03/12  5:29 AM      Component Value Range Comment   Sodium 136  135 - 145 (mEq/L)    Potassium 4.2  3.5 - 5.1 (mEq/L)    Chloride 99  96 - 112 (mEq/L)    CO2 30  19 - 32 (mEq/L)    Glucose, Bld 110 (*) 70 - 99 (mg/dL)    BUN 30 (*) 6 - 23 (mg/dL)    Creatinine, Ser 0.96  0.50 - 1.35 (mg/dL)    Calcium 9.4  8.4 - 10.5 (  mg/dL)    GFR calc non Af Amer 51 (*) >90 (mL/min)    GFR calc Af Amer 59 (*) >90 (mL/min)   GLUCOSE, CAPILLARY     Status: Normal   Collection Time   05/03/12  7:24 AM      Component Value Range Comment   Glucose-Capillary 95  70 - 99 (mg/dL)    Comment 1 Documented in Chart      Comment 2 Notify RN     GLUCOSE, CAPILLARY     Status: Abnormal   Collection Time   05/03/12 11:00 AM      Component Value Range Comment   Glucose-Capillary 156 (*) 70 - 99 (mg/dL)    Comment 1 Documented in Chart      Comment 2 Notify RN        HPI : The patient is an 78 year old man with a history significant for COPD, lung cancer, and type 2 diabetes mellitus, who presented to the emergency department on 04/29/2012 with a chief complaint of worsening shortness of breath. In the emergency department, he was hypertensive  with a blood pressure 174/86, otherwise afebrile and hemodynamically stable. He was oxygenating 99% on nasal cannula oxygen. His lab data were significant for a glucose of 212, hemoglobin of 10.8, chest x-ray that revealed chronic changes of COPD and postoperative changes but no definite evidence of acute cardiopulmonary disease, and an EKG that revealed atrial flutter with a heart rate of 97 beats per minute. He was admitted for further evaluation and management.  HOSPITAL COURSE: The patient was started on IV Solu-Medrol, albuterol/Atrovent nebulizers every 4 hours and then when necessary, and antibiotic treatment with intravenous Levaquin. An ABG was ordered shortly after admission on 2 L of oxygen and the results revealed a pH of 7.3, PCO2 of 46, and PO2 of 71. He was maintained on all of his chronic medications except metformin. Sliding scale NovoLog was instituted instead. His capillary blood glucose was monitored before each meal and at bedtime. He was noted to be in nature flutter on admission which was apparently new for the patient. When his heart rate increased, he was transferred to the ICU on a Cardizem drip. Albuterol nebulizer was discontinued in favor of Xopenex. For further evaluation, a number of studies were ordered. His cardiac enzymes were well within normal limits. His TSH was within normal limits at 1.4 and his free T4 was within normal limits at 1.24. His 2-D echocardiogram revealed preserved LV function with an ejection fraction of 55% and no significant valvular abnormalities. The results of his anemia panel were dictated above. Based on the results, he was started on iron and vitamin B12 supplementation. He denied any black tarry stools or bright red blood per rectum. He has a history of colon polyps per colonoscopy in October 2012. Because he was on steroids, Pepcid was added empirically and prophylactically. His fasting lipid profile revealed a total cholesterol of 106, triglycerides  of 47, HDL cholesterol of 64, and LDL of 33. He was maintained on statin therapy.  Cardiology was consulted for further evaluation and management of newly diagnosed atrial flutter. Dr. Diona Browner provided the initial consultation followed by Dr. Dietrich Pates. Cardizem was eventually changed to by mouth. The results of the 2-D echocardiogram were reviewed. Given that the patient's CHAD2 score was 3, anticoagulation was started with Xarelto following a discussion with cardiology and the patient. The patient decided against Coumadin. He also decided against cardioversion. The patient's INR was 2.24 during the hospitalization.  The patient's creatinine increased when ACE inhibitor therapy was started with lisinopril at 2.5 mg daily. Because of the increase, lisinopril was discontinued and the patient was started on gentle IV fluids. Following these measures , his creatinine improved from 1.54-1.29 at the time of discharge.  The patient remained in atrial flutter, however, his heart rate became controlled and remained controlled for several days. His pulmonary function improved. He was back to baseline. He was discharged to home on further treatment of his COPD with a prednisone taper, Levaquin, and continued bronchodilator therapy with Advair and DuoNeb. Samples were provided by the cardiology team for Xarelto, but the dosing was too high based on his creatinine clearance. He was also given a voucher for 10 free tablets. The patient will followup with cardiology in a couple of weeks.   Discharge Exam: Blood pressure 180/74, pulse 104, temperature 97.8 F (36.6 C), temperature source Oral, resp. rate 20, height 5\' 8"  (1.727 m), weight 74.3 kg (163 lb 12.8 oz), SpO2 96.00%.  Lungs: Faint wheeze occasionally. Breathing stable. Heart: S1, S2, with occasional ectopy. Abdomen: Positive bowel sounds, soft, nontender, nondistended. Extremities: No pedal edema.   Discharge Orders    Future Appointments: Provider:  Department: Dept Phone: Center:   05/15/2012 11:20 AM Jodelle Gross, NP [facility] [phone] [id number]     Future Orders Please Complete By Expires   Diet - low sodium heart healthy      Increase activity slowly      Discharge instructions      Comments:   TAKE YOUR MEDICATIONS AS PRESCRIBED. FOLLOWUP WITH YOUR CARDIOLOGIST AND PRIMARY CARE PHYSICIAN AS RECOMMENDED.      Follow-up Information    Follow up with Joni Reining, NP on 05/15/2012. (11:20 am)    Contact information:   [address] [place] [address] [phone]       Follow up with Colon Branch, MD. Schedule an appointment as soon as possible for a visit in 1 week.   Contact information:   [address] [address] [phone]          Total discharge time: 40 minutes.   Signed: Kate Sweetman 05/03/2012, 11:42 AM

## 2012-05-03 NOTE — Progress Notes (Signed)
Pt IV removed.  Tolerated well.

## 2012-05-04 ENCOUNTER — Emergency Department (HOSPITAL_COMMUNITY)
Admission: EM | Admit: 2012-05-04 | Discharge: 2012-05-04 | Disposition: A | Discharge status: Home / Self Care | Payer: Medicare Other | Attending: Emergency Medicine | Admitting: Emergency Medicine

## 2012-05-04 ENCOUNTER — Encounter (HOSPITAL_COMMUNITY): Payer: Self-pay | Admitting: *Deleted

## 2012-05-04 DIAGNOSIS — I10 Essential (primary) hypertension: Secondary | ICD-10-CM | POA: Insufficient documentation

## 2012-05-04 DIAGNOSIS — J4489 Other specified chronic obstructive pulmonary disease: Secondary | ICD-10-CM | POA: Insufficient documentation

## 2012-05-04 DIAGNOSIS — J449 Chronic obstructive pulmonary disease, unspecified: Secondary | ICD-10-CM | POA: Insufficient documentation

## 2012-05-04 DIAGNOSIS — I252 Old myocardial infarction: Secondary | ICD-10-CM | POA: Insufficient documentation

## 2012-05-04 DIAGNOSIS — E785 Hyperlipidemia, unspecified: Secondary | ICD-10-CM | POA: Insufficient documentation

## 2012-05-04 DIAGNOSIS — Z9981 Dependence on supplemental oxygen: Secondary | ICD-10-CM | POA: Insufficient documentation

## 2012-05-04 DIAGNOSIS — Z87891 Personal history of nicotine dependence: Secondary | ICD-10-CM | POA: Insufficient documentation

## 2012-05-04 DIAGNOSIS — Z85118 Personal history of other malignant neoplasm of bronchus and lung: Secondary | ICD-10-CM | POA: Insufficient documentation

## 2012-05-04 DIAGNOSIS — D509 Iron deficiency anemia, unspecified: Secondary | ICD-10-CM | POA: Insufficient documentation

## 2012-05-04 DIAGNOSIS — R339 Retention of urine, unspecified: Secondary | ICD-10-CM | POA: Insufficient documentation

## 2012-05-04 DIAGNOSIS — E119 Type 2 diabetes mellitus without complications: Secondary | ICD-10-CM | POA: Insufficient documentation

## 2012-05-04 LAB — URINALYSIS, ROUTINE W REFLEX MICROSCOPIC
Bilirubin Urine: NEGATIVE
Glucose, UA: NEGATIVE mg/dL
Ketones, ur: NEGATIVE mg/dL
Leukocytes, UA: NEGATIVE
Nitrite: NEGATIVE
Protein, ur: NEGATIVE mg/dL
pH: 6 (ref 5.0–8.0)

## 2012-05-04 NOTE — ED Provider Notes (Addendum)
History     CSN: [id number]  Arrival date & time 05/04/12  0455   First MD Initiated Contact with Patient 05/04/12 0517      No chief complaint on file.   (Consider location/radiation/quality/duration/timing/severity/associated sxs/prior treatment) The history is provided by the patient.  patient is a 78 year old male just discharged from the hospital yesterday on Sunday at noon he has been unable to avoid since returning home. In the hospital he was admitted for COPD exacerbation did not have a Foley catheter placed. He is followed by urology locally. Patient with complaint of suprapubic abdominal pain and distention very uncomfortable 10 out of 10 nonradiating feels like a fullness and ache. Denies any other symptoms.  Past Medical History  Diagnosis Date  . Myocardial infarction     30  years ago  . Shortness of breath   . Hyperlipidemia   . Hypertension   . Diabetes mellitus   . Arthritis   . COPD (chronic obstructive pulmonary disease)   . On home oxygen therapy     at night only  . Anemia 11/14/2011  . Colon polyps 08/2011    Per colonoscopy, Dr. Karilyn Cota.  Marland Kitchen BPH (benign prostatic hyperplasia)   . Urinary retention 11/15/2011  . Renal mass, right 11/16/2011  . Lung cancer   . Iron deficiency anemia 11/14/2011  . Atrial flutter with rapid ventricular response 04/29/2012    Past Surgical History  Procedure Date  . Cyst removed from face   . Colonoscopy 08/28/2011    Procedure: COLONOSCOPY;  Surgeon: Malissa Hippo, MD;  Location: [facility];  Service: Endoscopy;  Laterality: N/A;  . Doppler echocardiography 04/2012    Ejection fraction 55%.    Family History  Problem Relation Age of Onset  . Cancer Mother   . Cancer Sister   . Cancer Brother     History  Substance Use Topics  . Smoking status: Former Smoker -- 1.5 packs/day for 60 years    Types: Cigarettes    Quit date: 08/06/2008  . Smokeless tobacco: Never Used  . Alcohol Use: Yes     occasional        Review of Systems  Constitutional: Negative for fever and chills.  HENT: Negative for congestion and neck pain.   Eyes: Negative for visual disturbance.  Respiratory: Positive for cough. Negative for shortness of breath.   Cardiovascular: Negative for chest pain.  Gastrointestinal: Positive for abdominal pain. Negative for nausea, vomiting and diarrhea.  Genitourinary: Positive for difficulty urinating.  Musculoskeletal: Negative for back pain.  Skin: Negative for rash.  Neurological: Negative for headaches.  Hematological: Does not bruise/bleed easily.    Allergies  Review of patient's allergies indicates no known allergies.  Home Medications   Current Outpatient Rx  Name Route Sig Dispense Refill  . ALBUTEROL SULFATE HFA 108 (90 BASE) MCG/ACT IN AERS Inhalation Inhale 2 puffs into the lungs every 6 (six) hours as needed.    . CYANOCOBALAMIN 1000 MCG PO TABS Oral Take 1 tablet (1,000 mcg total) by mouth daily.    Marland Kitchen DILTIAZEM HCL ER COATED BEADS 180 MG PO CP24 Oral Take 1 capsule (180 mg total) by mouth daily. New medication for your heart rate and your blood pressure. 30 capsule 2  . FAMOTIDINE 20 MG PO TABS Oral Take 1 tablet (20 mg total) by mouth daily. 30 tablet 6  . FLUTICASONE-SALMETEROL 250-50 MCG/DOSE IN AEPB Inhalation Inhale 1 puff into the lungs every 12 (twelve) hours.      Marland Kitchen  FUROSEMIDE 40 MG PO TABS Oral Take 40 mg by mouth daily.      . IPRATROPIUM-ALBUTEROL 0.5-2.5 (3) MG/3ML IN SOLN Nebulization Take 3 mLs by nebulization 4 (four) times daily. 360 mL 2  . POLYSACCHARIDE IRON COMPLEX 150 MG PO CAPS Oral Take 1 capsule (150 mg total) by mouth daily. Iron supplementation for your anemia. 30 capsule 2  . LEVOFLOXACIN 250 MG PO TABS Oral Take 1 tablet (250 mg total) by mouth daily. Antibiotic. 4 tablet 0  . METFORMIN HCL 1000 MG PO TABS Oral Take 1,000 mg by mouth 2 (two) times daily.    . MOMETASONE FUROATE 50 MCG/ACT NA SUSP Nasal Place 2 sprays into the nose  daily.    Marland Kitchen PREDNISONE 10 MG PO TABS  STARTING TOMORROW, TAKE 6 TABLETS FOR 1 DAY; THEN 5 TABLETS NEXT DAY; THEN 4 TABLETS THE NEXT DAY; THEN 3 TABLETS NEXT DAY; THEN 2 TABLETS NEXT DAY; THEN 1 TABLET THE NEXT DAY; THEN STOP. 21 tablet 0  . RIVAROXABAN 15 MG PO TABS Oral Take 1 tablet (15 mg total) by mouth daily. 30 tablet 2  . SIMVASTATIN 40 MG PO TABS Oral Take 40 mg by mouth daily.     Marland Kitchen TAMSULOSIN HCL 0.4 MG PO CAPS Oral Take 0.4 mg by mouth daily.        BP 150/86  Pulse 99  Temp(Src) 97.9 F (36.6 C) (Oral)  Resp 20  Ht 5\' 7"  (1.702 m)  Wt 168 lb (76.204 kg)  BMI 26.31 kg/m2  SpO2 100%  Physical Exam  Nursing note and vitals reviewed. Constitutional: He is oriented to person, place, and time. He appears well-developed and well-nourished. No distress.  HENT:  Head: Normocephalic and atraumatic.  Mouth/Throat: Oropharynx is clear and moist.  Eyes: Conjunctivae and EOM are normal. Pupils are equal, round, and reactive to light.  Neck: Normal range of motion. Neck supple.  Cardiovascular: Normal rate, regular rhythm and normal heart sounds.   No murmur heard. Abdominal: Soft. Bowel sounds are normal. He exhibits mass. There is no tenderness.       Bladder fullness and some tenderness.  Genitourinary: Penis normal.  Musculoskeletal: Normal range of motion.  Neurological: He is alert and oriented to person, place, and time. No cranial nerve deficit. He exhibits normal muscle tone. Coordination normal.  Skin: Skin is warm. No rash noted.    ED Course  Procedures (including critical care time)  Labs Reviewed  URINALYSIS, ROUTINE W REFLEX MICROSCOPIC - Abnormal; Notable for the following:    Hgb urine dipstick MODERATE (*)    All other components within normal limits  URINE MICROSCOPIC-ADD ON   No results found. Results for orders placed during the hospital encounter of 05/04/12  URINALYSIS, ROUTINE W REFLEX MICROSCOPIC      Component Value Range   Color, Urine YELLOW   YELLOW    APPearance CLEAR  CLEAR    Specific Gravity, Urine 1.010  1.005 - 1.030    pH 6.0  5.0 - 8.0    Glucose, UA NEGATIVE  NEGATIVE (mg/dL)   Hgb urine dipstick MODERATE (*) NEGATIVE    Bilirubin Urine NEGATIVE  NEGATIVE    Ketones, ur NEGATIVE  NEGATIVE (mg/dL)   Protein, ur NEGATIVE  NEGATIVE (mg/dL)   Urobilinogen, UA 0.2  0.0 - 1.0 (mg/dL)   Nitrite NEGATIVE  NEGATIVE    Leukocytes, UA NEGATIVE  NEGATIVE   URINE MICROSCOPIC-ADD ON      Component Value Range   RBC /  HPF 3-6  <3 (RBC/hpf)     1. Urinary retention       MDM  Patient with urinary retention Foley placed over 800 cc of urine came out. Patient just discharged from the hospital yesterday for COPD exacerbation. Has had trouble with urinary retention in the past followed by urology locally. Patient will be switched over to a leg bag will followup with urology in the next one to 2 days. Patient understands leg bag as to be in place for 2 days. Urinalysis without evidence of infection. Slight hematuria on the urinalysis most likely due to placement of the Foley.        Shelda Jakes, MD 05/04/12 1610  Shelda Jakes, MD 05/04/12 [phone]

## 2012-05-04 NOTE — ED Notes (Signed)
Emptied urine bag of approximately urine.

## 2012-05-04 NOTE — ED Notes (Signed)
Converted foley catheter to leg bag for discharge. Bag attached to right leg. Patient demonstrated understanding of use of leg bag.

## 2012-05-04 NOTE — Discharge Instructions (Signed)
Acute Urinary Retention, Male You have been seen by a caregiver today because of your inability to urinate (pass your water). This is a common problem in elderly males. As men age their prostates become larger and block the flow of urine from the bladder. This is usually a problem that has come on gradually. It is often first noticed by having to get up at night to urinate. This is because as the prostate enlarges it is more difficult to empty the bladder completely. Treatment may involve a one time catheterization to empty the bladder. This is putting in a tube to drain your urine. Then you and your personal caregiver can decide at your earliest convenience how to handle this problem in the future. It may also be a problem that may not recur for years. Sometimes this problem can be caused by medications. In this case, all that is often necessary is to discontinue the offending agent. If you are to leave the foley catheter (a long, narrow, hollow tube) in and go home with a drainage system, you will need to discuss the best course of action with your caregiver. While the catheter is in, maintain a good intake of fluids. Keep the drainage bag emptied and lower than your catheter. This is so contaminated (infected) urine will not be flowing back into your bladder. This could lead to a urinary tract infection. Only take over-the-counter or prescription medicines for pain, discomfort, or fever as directed by your caregiver.  SEEK IMMEDIATE MEDICAL CARE IF:  You develop chills, fever, or show signs of generalized illness that occurs prior to seeing your caregiver. Document Released: 02/17/2001 Document Revised: 10/31/2011 Document Reviewed: 11/02/2008 [facility] Patient Information 2012 [place], [place].   Keep leg bag in place. Give your urologist a call for followup in the next 2-3 days. Return for any new or worse symptoms.

## 2012-05-04 NOTE — ED Notes (Signed)
Pt discharged from hospital yesterday.  States he has not been able to void since returning home.

## 2012-05-08 DIAGNOSIS — T83091A Other mechanical complication of indwelling urethral catheter, initial encounter: Secondary | ICD-10-CM | POA: Insufficient documentation

## 2012-05-08 DIAGNOSIS — Z85118 Personal history of other malignant neoplasm of bronchus and lung: Secondary | ICD-10-CM | POA: Insufficient documentation

## 2012-05-08 DIAGNOSIS — Z87891 Personal history of nicotine dependence: Secondary | ICD-10-CM | POA: Insufficient documentation

## 2012-05-08 DIAGNOSIS — E785 Hyperlipidemia, unspecified: Secondary | ICD-10-CM | POA: Insufficient documentation

## 2012-05-08 DIAGNOSIS — E119 Type 2 diabetes mellitus without complications: Secondary | ICD-10-CM | POA: Insufficient documentation

## 2012-05-08 DIAGNOSIS — Z79899 Other long term (current) drug therapy: Secondary | ICD-10-CM | POA: Insufficient documentation

## 2012-05-08 DIAGNOSIS — J4489 Other specified chronic obstructive pulmonary disease: Secondary | ICD-10-CM | POA: Insufficient documentation

## 2012-05-08 DIAGNOSIS — Z9981 Dependence on supplemental oxygen: Secondary | ICD-10-CM | POA: Insufficient documentation

## 2012-05-08 DIAGNOSIS — N400 Enlarged prostate without lower urinary tract symptoms: Secondary | ICD-10-CM | POA: Insufficient documentation

## 2012-05-08 DIAGNOSIS — J449 Chronic obstructive pulmonary disease, unspecified: Secondary | ICD-10-CM | POA: Insufficient documentation

## 2012-05-08 DIAGNOSIS — I10 Essential (primary) hypertension: Secondary | ICD-10-CM | POA: Insufficient documentation

## 2012-05-08 DIAGNOSIS — Y846 Urinary catheterization as the cause of abnormal reaction of the patient, or of later complication, without mention of misadventure at the time of the procedure: Secondary | ICD-10-CM | POA: Insufficient documentation

## 2012-05-09 ENCOUNTER — Emergency Department (HOSPITAL_COMMUNITY)
Admission: EM | Admit: 2012-05-09 | Discharge: 2012-05-09 | Disposition: A | Discharge status: Home / Self Care | Payer: Medicare Other | Attending: Emergency Medicine | Admitting: Emergency Medicine

## 2012-05-09 ENCOUNTER — Encounter (HOSPITAL_COMMUNITY): Payer: Self-pay

## 2012-05-09 DIAGNOSIS — T83018A Breakdown (mechanical) of other indwelling urethral catheter, initial encounter: Secondary | ICD-10-CM

## 2012-05-09 MED ORDER — LIDOCAINE HCL (PF) 1 % IJ SOLN
INTRAMUSCULAR | Status: AC
  Filled 2012-05-09: qty 5

## 2012-05-09 NOTE — ED Notes (Signed)
Leg bag draining clear yellow urine; No distress nor complaints noted

## 2012-05-09 NOTE — ED Notes (Signed)
Leg bag has been leaking for two hours. Has soaked my pants per pt.

## 2012-05-09 NOTE — Discharge Instructions (Signed)
Return to the emergency department for pain, vomiting, fevers. You may also return if you're Foley catheter has more dysfunction including leaking or if it becomes blocked.

## 2012-05-09 NOTE — ED Provider Notes (Signed)
History     CSN: [id number]  Arrival date & time 05/08/12  2355   None     Chief Complaint  Patient presents with  . Male GU Problem    (Consider location/radiation/quality/duration/timing/severity/associated sxs/prior treatment) HPI Comments: Patient had recent Foley catheter placed. States that approximately 3 hours ago his leg bag started leaking. Denies abdominal pain, penile pain, fevers, nausea, vomiting. Urine has been clear  Patient is a 78 y.o. male presenting with male genitourinary complaint. The history is provided by the patient, the spouse and medical records.  Male GU Problem Primary symptoms include no penile pain. Pertinent negatives include no nausea, no vomiting and no abdominal pain.    Past Medical History  Diagnosis Date  . Myocardial infarction     30 years ago  . Shortness of breath   . Hyperlipidemia   . Hypertension   . Diabetes mellitus   . Arthritis   . COPD (chronic obstructive pulmonary disease)   . On home oxygen therapy     at night only  . Anemia 11/14/2011  . Colon polyps 08/2011    Per colonoscopy, Dr. Karilyn Cota.  Marland Kitchen BPH (benign prostatic hyperplasia)   . Urinary retention 11/15/2011  . Renal mass, right 11/16/2011  . Lung cancer   . Iron deficiency anemia 11/14/2011  . Atrial flutter with rapid ventricular response 04/29/2012    Past Surgical History  Procedure Date  . Cyst removed from face   . Colonoscopy 08/28/2011    Procedure: COLONOSCOPY;  Surgeon: Malissa Hippo, MD;  [facility];  Service: Endoscopy;  Laterality: N/A;  . Doppler echocardiography 04/2012    Ejection fraction 55%.    Family History  Problem Relation Age of Onset  . Cancer Mother   . Cancer Sister   . Cancer Brother     History  Substance Use Topics  . Smoking status: Former Smoker -- 1.5 packs/day for 60 years    Types: Cigarettes    Quit date: 08/06/2008  . Smokeless tobacco: Never Used  . Alcohol Use: Yes     occasional        Review of Systems  Constitutional: Negative for fever.  Gastrointestinal: Negative for nausea, vomiting and abdominal pain.  Genitourinary: Negative for penile pain.  Musculoskeletal: Negative for back pain.    Allergies  Review of patient's allergies indicates no known allergies.  Home Medications   Current Outpatient Rx  Name Route Sig Dispense Refill  . ALBUTEROL SULFATE HFA 108 (90 BASE) MCG/ACT IN AERS Inhalation Inhale 2 puffs into the lungs every 6 (six) hours as needed.    . CYANOCOBALAMIN 1000 MCG PO TABS Oral Take 1 tablet (1,000 mcg total) by mouth daily.    Marland Kitchen DILTIAZEM HCL ER COATED BEADS 180 MG PO CP24 Oral Take 1 capsule (180 mg total) by mouth daily. New medication for your heart rate and your blood pressure. 30 capsule 2  . FAMOTIDINE 20 MG PO TABS Oral Take 1 tablet (20 mg total) by mouth daily. 30 tablet 6  . FLUTICASONE-SALMETEROL 250-50 MCG/DOSE IN AEPB Inhalation Inhale 1 puff into the lungs every 12 (twelve) hours.      . FUROSEMIDE 40 MG PO TABS Oral Take 40 mg by mouth daily.      . IPRATROPIUM-ALBUTEROL 0.5-2.5 (3) MG/3ML IN SOLN Nebulization Take 3 mLs by nebulization 4 (four) times daily. 360 mL 2  . POLYSACCHARIDE IRON COMPLEX 150 MG PO CAPS Oral Take 1 capsule (150 mg total) by  mouth daily. Iron supplementation for your anemia. 30 capsule 2  . LEVOFLOXACIN 250 MG PO TABS Oral Take 1 tablet (250 mg total) by mouth daily. Antibiotic. 4 tablet 0  . METFORMIN HCL 1000 MG PO TABS Oral Take 1,000 mg by mouth 2 (two) times daily.    . MOMETASONE FUROATE 50 MCG/ACT NA SUSP Nasal Place 2 sprays into the nose daily.    Marland Kitchen PREDNISONE 10 MG PO TABS  STARTING TOMORROW, TAKE 6 TABLETS FOR 1 DAY; THEN 5 TABLETS NEXT DAY; THEN 4 TABLETS THE NEXT DAY; THEN 3 TABLETS NEXT DAY; THEN 2 TABLETS NEXT DAY; THEN 1 TABLET THE NEXT DAY; THEN STOP. 21 tablet 0  . RIVAROXABAN 15 MG PO TABS Oral Take 1 tablet (15 mg total) by mouth daily. 30 tablet 2  . SIMVASTATIN 40 MG PO TABS  Oral Take 40 mg by mouth daily.     Marland Kitchen TAMSULOSIN HCL 0.4 MG PO CAPS Oral Take 0.4 mg by mouth daily.        BP 137/68  Pulse 75  Temp 96 F (35.6 C) (Oral)  Resp 16  Ht 5\' 7"  (1.702 m)  Wt 168 lb (76.204 kg)  BMI 26.31 kg/m2  SpO2 96%  Physical Exam  Nursing note and vitals reviewed. Constitutional: He appears well-developed and well-nourished.  Eyes: Conjunctivae are normal. No scleral icterus.  Cardiovascular: Normal rate, regular rhythm and normal heart sounds.   Pulmonary/Chest: Effort normal and breath sounds normal.  Abdominal: Soft. There is no tenderness.  Genitourinary:       Foley catheter well seated, no leakage around the Foley catheter at the head of the penis, after Foley bag replaced, no leakage  Musculoskeletal: He exhibits no edema and no tenderness.  Neurological: He is alert.  Skin: Skin is warm and dry.    ED Course  Procedures (including critical care time)  Labs Reviewed - No data to display No results found.   1. Urinary catheter dysfunction       MDM  Patient well appearing, at this time the vital signs are normal and reassuring, patient feels well, urine is clear, no fevers or other signs to suggest urinary infection, Foley catheter leg bag has been replaced and is now functioning appropriately. Follow up with PMD when necessary        Vida Roller, MD 05/09/12 [phone]

## 2012-05-09 NOTE — ED Notes (Signed)
Pt and family verbalized understanding of dc instructions; Stated will return here or follow up with MD if needed

## 2012-05-09 NOTE — ED Notes (Signed)
Leg bag changed at this time

## 2012-05-13 ENCOUNTER — Encounter (HOSPITAL_COMMUNITY): Payer: Self-pay | Admitting: *Deleted

## 2012-05-13 ENCOUNTER — Emergency Department (HOSPITAL_COMMUNITY)
Admission: EM | Admit: 2012-05-13 | Discharge: 2012-05-13 | Disposition: A | Discharge status: Home / Self Care | Payer: Medicare Other | Attending: Emergency Medicine | Admitting: Emergency Medicine

## 2012-05-13 DIAGNOSIS — I4892 Unspecified atrial flutter: Secondary | ICD-10-CM | POA: Insufficient documentation

## 2012-05-13 DIAGNOSIS — R339 Retention of urine, unspecified: Secondary | ICD-10-CM | POA: Insufficient documentation

## 2012-05-13 DIAGNOSIS — Z87891 Personal history of nicotine dependence: Secondary | ICD-10-CM | POA: Insufficient documentation

## 2012-05-13 DIAGNOSIS — M129 Arthropathy, unspecified: Secondary | ICD-10-CM | POA: Insufficient documentation

## 2012-05-13 DIAGNOSIS — I252 Old myocardial infarction: Secondary | ICD-10-CM | POA: Insufficient documentation

## 2012-05-13 DIAGNOSIS — N400 Enlarged prostate without lower urinary tract symptoms: Secondary | ICD-10-CM | POA: Insufficient documentation

## 2012-05-13 DIAGNOSIS — Z85118 Personal history of other malignant neoplasm of bronchus and lung: Secondary | ICD-10-CM | POA: Insufficient documentation

## 2012-05-13 DIAGNOSIS — Z809 Family history of malignant neoplasm, unspecified: Secondary | ICD-10-CM | POA: Insufficient documentation

## 2012-05-13 DIAGNOSIS — Z862 Personal history of diseases of the blood and blood-forming organs and certain disorders involving the immune mechanism: Secondary | ICD-10-CM | POA: Insufficient documentation

## 2012-05-13 DIAGNOSIS — J449 Chronic obstructive pulmonary disease, unspecified: Secondary | ICD-10-CM | POA: Insufficient documentation

## 2012-05-13 DIAGNOSIS — I10 Essential (primary) hypertension: Secondary | ICD-10-CM | POA: Insufficient documentation

## 2012-05-13 DIAGNOSIS — Z860100 Personal history of colon polyps, unspecified: Secondary | ICD-10-CM | POA: Insufficient documentation

## 2012-05-13 DIAGNOSIS — E119 Type 2 diabetes mellitus without complications: Secondary | ICD-10-CM | POA: Insufficient documentation

## 2012-05-13 DIAGNOSIS — J4489 Other specified chronic obstructive pulmonary disease: Secondary | ICD-10-CM | POA: Insufficient documentation

## 2012-05-13 DIAGNOSIS — E785 Hyperlipidemia, unspecified: Secondary | ICD-10-CM | POA: Insufficient documentation

## 2012-05-13 LAB — URINALYSIS, ROUTINE W REFLEX MICROSCOPIC
Bilirubin Urine: NEGATIVE
Glucose, UA: NEGATIVE mg/dL
Hgb urine dipstick: NEGATIVE
Specific Gravity, Urine: <1.005 — ABNORMAL LOW (ref 1.005–1.030)
Urobilinogen, UA: 0.2 mg/dL (ref 0.0–1.0)

## 2012-05-13 NOTE — ED Notes (Signed)
Changed foley bag to leg bag and instructions given on how to change bag and proper sterile technique.  Also shown how to empty leg bag.  PT and family verbalized understanding.

## 2012-05-13 NOTE — ED Notes (Signed)
Pt reports had foley cath in for 4 days.  Reports foley was removed yesterday around 2pm and pt has only been able to void small amounts.  Misty, NT from 300 placed foley cath without difficulty.  Pt drained into foley bag.  Pt says feels much better.  PT c/o feeling like bladder full and was a little SOB.  Pt says is always SOB and is on home 02.  After foley inserted and bladder drained pt says feels much better.

## 2012-05-13 NOTE — Discharge Instructions (Signed)
RESOURCE GUIDE  Chronic Pain Problems: Contact [facility]  [phone] Patients need to be referred by their primary care doctor.  Insufficient Money for Medicine: Contact United Way:  call "211" or [facility] [phone].  No Primary Care Doctor: - Call [facility]  [phone] - can help you locate a primary care doctor that  accepts your insurance, provides certain services, etc. - Physician Referral Service- 1-800-533-3463  Agencies that provide inexpensive medical care: - [facility]  [phone] - [facility]  [phone] - [facility]  [phone] - [facility]  [phone] - Planned Parenthood  [phone] - [facility]  [phone]  Medicaid-accepting [place] Providers: - [facility]- [address]  [phone], Mon-Fri 9am-7pm, Sat 9am-1pm - [facility]- [address], [address]  [phone] - [facility]- [address], [address]  [phone] - [facility]- [address] [place] [address]  [phone] - Veita Bland- [address], [phone]  Only accepts [place] Access Medicaid patients after they have their name  applied to their card  Self Pay (no insurance) in [place]: - Sickle Cell Patients: Dr Eric Dean, [facility]  [address], [phone] - [facility]- [address]  [phone]       -     [facility] [place]- [address]       -     [facility]- see information above (Speak to Pam H if you do not have insurance)       -  [facility]- [address], [phone]       -  [facility] [place]- [address],  [phone]       -  [facility]- [address] [place] [address], [phone]       -  Dr Osei-Bonsu-  [address], [place], [phone]       -  [facility]- [address], [phone]       -  [facility]- [address] [place] [address], [phone], also [address], [phone]       -    [facility]- [address], [phone], 1st & 3rd Saturday   every month, 10am-1pm  1) Find a Doctor and Pay Out of Pocket Although you won't have to find out who is covered by your insurance plan, it is a good idea to ask around and get recommendations. You will then need to call the office and see if the doctor you have chosen will accept you as a new patient and what types of options they offer for patients who are self-pay. Some doctors offer discounts or will set up payment plans for their patients who do not have insurance, but you will need to ask so you aren't surprised when you get to your appointment.  2) Contact Your Local Health Department Not all health departments have doctors that can see patients for sick visits, but many do, so it is worth a call to see if yours does. If you don't know where your local health department is, you can check in your phone book. The CDC also has a tool to help you locate your state's health department, and many state websites also have   listings of all of their local health departments.  3) Find a Walk-in Clinic If your illness is not likely to be very severe or complicated, you may want to try a walk in clinic. These are popping up all over the country in pharmacies, drugstores, and shopping centers. They're usually staffed by nurse practitioners or physician assistants that have been trained to treat common illnesses and complaints. They're usually fairly quick and inexpensive. However, if you have serious medical issues or chronic medical problems, these are probably not your best option  STD Testing - [place] [facility] [place], [facility] Clinic, [address], [place], phone [phone] or [phone].  Monday - Friday, call for an appointment. - [place]  [facility] [place], [facility] Clinic, [address], [place], phone [phone] or [phone].  Monday - Friday, call for an appointment.  Abuse/Neglect: - [place] Child Abuse Hotline (336) 641-3795 - [place] Child Abuse Hotline 800-378-5315 (After Hours)  Emergency Shelter:  [facility] (336) 271-5985  Maternity Homes: - [facility] (336) 275-9566 - [facility] (704) 372-4663  MRSA Hotline #:   832-7006  [place] Resources  [facility] [place]  [employer] [place] [facility] [address]                 [address]         [address]  [place]                                               [place]                              [place] Phone:  [phone]                                  Phone:  [phone]                   Phone:  [phone]  [place] [facility], [phone] - [place] Services - [facility]- 1-888-581-9988       -     [facility] in [place], [address],                                  336-349-4454, Insurance  [place] Child Abuse Hotline (336) 342-1394 or (336) 342-3537 (After Hours)   Behavioral Health Services  Substance Abuse Resources: - [facility]  336-882-2125 - [facility] 336-784-9470 - [facility] 336-285-9073 - [facility] 336-845-3988 - Residential & Outpatient Substance Abuse Program  800-659-3381  Psychological Services: -  Health  [phone] - [facility]  [phone] - [place] [facility], [address], [place], ACCESS LINE: 1-800-853-5163 or 336-641-4981, Http://www.[facility].com/services/adult.htm  Dental Assistance  If unable to pay or uninsured, contact:  [facility] or [place] [facility] to become qualified for the adult dental  clinic.  Patients with Medicaid:  [facility] [address], [phone], [phone]  If unable   to pay, or uninsured, contact [facility] (346)844-5339) or [facility] Department [phone] in [place], [phone] in [facility]) to become qualified for the adult dental clinic  Other Low-Cost Community Dental Services: - [facility]- [address] [place], [place], [place], [address], [phone], Ext. [address], 2nd and 4th Thursday of the month at 6:30am.  10 clients each day by appointment, can sometimes see walk-in patients if someone does not show for an appointment. [facility]- [address] [place], [place], [address], [phone] - [facility]- [address] [place], [place], [address], [phone] [facility] Health Department- [phone] [facility] Health Department- [phone] [facility] [phone]     Take your usual prescriptions as previously directed.  Keep the foley catheter in place until you are seen in follow up by your Urologist.  Call your regular Urologist today to schedule a follow up appointment within the next 2 days.  Return to the Emergency Department immediately sooner if worsening.

## 2012-05-13 NOTE — ED Provider Notes (Signed)
History  This chart was scribed for Brendan Anger, DO by Bennett Scrape. This patient was seen in room APA04/APA04 and the patient's care was started at 1:17PM.  CSN: [id number]  Arrival date & time 05/13/12  1201   First MD Initiated Contact with Patient 05/13/12 1317      Chief Complaint  Patient presents with  . Urinary Retention    The history is provided by the patient. No language interpreter was used.   Brendan Hall is a 78 y.o. male who presents to the Emergency Department complaining of gradual onset and persistence of constant urinary retention that began yesterday.  States he had a foley catheter in for 4 days for the same symptoms.  Describes his symptoms as "it feels like my bladder is full," and "uncomfortable."  Endorses those symptoms make him feel "SOB."   Pt was eval yesterday by Uro Dr. Jerre Simon, had his foley removed.  Since approx 1400 yesterday (after the foley removal), pt has only been voiding in small amounts.  Denies CP/palpitations, no cough, no back pain, no fevers, no rash, no testicular pain/swelling, no N/V/D, no hematuria/dysuria.  Pt has hx of chronic SOB, on home O2 N/C for same.     Dr. Virgina Organ is PCP Uro Dr Jerre Simon Past Medical History  Diagnosis Date  . Myocardial infarction     30 years ago  . Shortness of breath   . Hyperlipidemia   . Hypertension   . Diabetes mellitus   . Arthritis   . COPD (chronic obstructive pulmonary disease)   . On home oxygen therapy     at night only  . Anemia 11/14/2011  . Colon polyps 08/2011    Per colonoscopy, Dr. Karilyn Cota.  Marland Kitchen BPH (benign prostatic hyperplasia)   . Urinary retention 11/15/2011  . Renal mass, right 11/16/2011  . Lung cancer   . Iron deficiency anemia 11/14/2011  . Atrial flutter with rapid ventricular response 04/29/2012    Past Surgical History  Procedure Date  . Cyst removed from face   . Colonoscopy 08/28/2011    Procedure: COLONOSCOPY;  Surgeon: Malissa Hippo, MD;  [facility];  Service: Endoscopy;  Laterality: N/A;  . Doppler echocardiography 04/2012    Ejection fraction 55%.    Family History  Problem Relation Age of Onset  . Cancer Mother   . Cancer Sister   . Cancer Brother     History  Substance Use Topics  . Smoking status: Former Smoker -- 1.5 packs/day for 60 years    Types: Cigarettes    Quit date: 08/06/2008  . Smokeless tobacco: Never Used  . Alcohol Use: No     occasional       Review of Systems ROS: Statement: All systems negative except as marked or noted in the HPI; Constitutional: Negative for fever and chills. ; ; Eyes: Negative for eye pain, redness and discharge. ; ; ENMT: Negative for ear pain, hoarseness, nasal congestion, sinus pressure and sore throat. ; ; Cardiovascular: Negative for chest pain, palpitations, diaphoresis, and peripheral edema. ; ; Respiratory: Negative for cough, wheezing and stridor. ; ; Gastrointestinal: Negative for nausea, vomiting, diarrhea, abdominal pain, blood in stool, hematemesis, jaundice and rectal bleeding. . ; ; Genitourinary: +urinary retention. Negative for dysuria, flank pain and hematuria.; Genital:  No penile drainage or rash, no testicular pain or swelling, no scrotal rash or swelling. ;; ; Musculoskeletal: Negative for back pain and neck pain. Negative for swelling and trauma.; ;  Skin: Negative for pruritus, rash, abrasions, blisters, bruising and skin lesion.; ; Neuro: Negative for headache, lightheadedness and neck stiffness. Negative for weakness, altered level of consciousness , altered mental status, extremity weakness, paresthesias, involuntary movement, seizure and syncope.       Allergies  Review of patient's allergies indicates no known allergies.  Home Medications   Current Outpatient Rx  Name Route Sig Dispense Refill  . ALBUTEROL SULFATE HFA 108 (90 BASE) MCG/ACT IN AERS Inhalation Inhale 2 puffs into the lungs every 6 (six) hours as needed.    . CYANOCOBALAMIN 1000  MCG PO TABS Oral Take 1 tablet (1,000 mcg total) by mouth daily.    Marland Kitchen DILTIAZEM HCL ER COATED BEADS 180 MG PO CP24 Oral Take 1 capsule (180 mg total) by mouth daily. New medication for your heart rate and your blood pressure. 30 capsule 2  . FAMOTIDINE 20 MG PO TABS Oral Take 1 tablet (20 mg total) by mouth daily. 30 tablet 6  . FLUTICASONE-SALMETEROL 250-50 MCG/DOSE IN AEPB Inhalation Inhale 1 puff into the lungs every 12 (twelve) hours.      . FUROSEMIDE 40 MG PO TABS Oral Take 40 mg by mouth daily.      . IPRATROPIUM-ALBUTEROL 0.5-2.5 (3) MG/3ML IN SOLN Nebulization Take 3 mLs by nebulization 4 (four) times daily. 360 mL 2  . POLYSACCHARIDE IRON COMPLEX 150 MG PO CAPS Oral Take 1 capsule (150 mg total) by mouth daily. Iron supplementation for your anemia. 30 capsule 2  . LEVOFLOXACIN 250 MG PO TABS Oral Take 1 tablet (250 mg total) by mouth daily. Antibiotic. 4 tablet 0  . METFORMIN HCL 1000 MG PO TABS Oral Take 1,000 mg by mouth 2 (two) times daily.    . MOMETASONE FUROATE 50 MCG/ACT NA SUSP Nasal Place 2 sprays into the nose daily.    Marland Kitchen PREDNISONE 10 MG PO TABS  STARTING TOMORROW, TAKE 6 TABLETS FOR 1 DAY; THEN 5 TABLETS NEXT DAY; THEN 4 TABLETS THE NEXT DAY; THEN 3 TABLETS NEXT DAY; THEN 2 TABLETS NEXT DAY; THEN 1 TABLET THE NEXT DAY; THEN STOP. 21 tablet 0  . RIVAROXABAN 15 MG PO TABS Oral Take 1 tablet (15 mg total) by mouth daily. 30 tablet 2  . SIMVASTATIN 40 MG PO TABS Oral Take 40 mg by mouth daily.     Marland Kitchen TAMSULOSIN HCL 0.4 MG PO CAPS Oral Take 0.4 mg by mouth daily.        Triage Vitals: BP 142/79  Pulse 70  Temp 98.5 F (36.9 C) (Oral)  Resp 20  Ht 5\' 7"  (1.702 m)  Wt 166 lb (75.297 kg)  BMI 26.00 kg/m2  SpO2 96%  Physical Exam 1325: Physical examination:  Nursing notes reviewed; Vital signs and O2 SAT reviewed;  Constitutional: Well developed, Well nourished, Well hydrated, In no acute distress; Head:  Normocephalic, atraumatic; Eyes: EOMI, PERRL, No scleral icterus; ENMT:  Mouth and pharynx normal, Mucous membranes moist; Neck: Supple, Full range of motion, No lymphadenopathy; Cardiovascular: Regular rate and rhythm, No murmur, rub, or gallop; Respiratory: Breath sounds clear & equal bilaterally, No rales, rhonchi, wheezes.  Speaking full sentences with ease, Normal respiratory effort/excursion; Chest: Nontender, Movement normal; Abdomen: Soft, Nontender, Nondistended, Normal bowel sounds, +foley draining clear/yellow urine;; Extremities: Pulses normal, No tenderness, No edema, No calf edema or asymmetry.; Neuro: AA&Ox3, Major CN grossly intact.  Speech clear. No gross focal motor or sensory deficits in extremities.; Skin: Color normal, Warm, Dry.   ED Course  Procedures  MDM  MDM Reviewed: nursing note, vitals and previous chart Interpretation: labs   Results for orders placed during the hospital encounter of 05/13/12  URINALYSIS, ROUTINE W REFLEX MICROSCOPIC      Component Value Range   Color, Urine YELLOW  YELLOW   APPearance CLEAR  CLEAR   Specific Gravity, Urine <1.005 (*) 1.005 - 1.030   pH 6.5  5.0 - 8.0   Glucose, UA NEGATIVE  NEGATIVE mg/dL   Hgb urine dipstick NEGATIVE  NEGATIVE   Bilirubin Urine NEGATIVE  NEGATIVE   Ketones, ur NEGATIVE  NEGATIVE mg/dL   Protein, ur NEGATIVE  NEGATIVE mg/dL   Urobilinogen, UA 0.2  0.0 - 1.0 mg/dL   Nitrite NEGATIVE  NEGATIVE   Leukocytes, UA NEGATIVE  NEGATIVE     3:32 PM:  ED RN placed foley cath with immediate return of approx urine.  No UTI on Udip, UC is pending.  Pt states he feels "much better now" after foley catheter placement and wants to go home.  VS remain stable.  Dx testing d/w pt and family.  Questions answered.  Verb understanding, agreeable to d/c home with outpt f/u with his Uro MD.   The patient appears reasonably screened and/or stabilized for discharge and I doubt any other medical condition or other [facility] requiring further screening, evaluation, or treatment in the ED at this time  prior to discharge.     I personally performed the services described in this documentation, which was scribed in my presence. The recorded information has been reviewed and considered. Clayten Allcock Allison Quarry, DO 05/16/12 1443

## 2012-05-13 NOTE — ED Notes (Addendum)
Sob, urinary retention .  On home o2,  Pts main complaint is urinary retention, but family says sob also

## 2012-05-14 ENCOUNTER — Encounter: Payer: Self-pay | Admitting: Adult Health

## 2012-05-14 LAB — URINE CULTURE
Colony Count: NO GROWTH
Culture  Setup Time: 201306200128
Culture: NO GROWTH

## 2012-05-15 ENCOUNTER — Encounter: Payer: Medicare Other | Admitting: Adult Health

## 2012-05-19 ENCOUNTER — Ambulatory Visit (INDEPENDENT_AMBULATORY_CARE_PROVIDER_SITE_OTHER): Payer: Medicare Other | Admitting: Adult Health

## 2012-05-19 ENCOUNTER — Ambulatory Visit (HOSPITAL_COMMUNITY)
Admission: RE | Admit: 2012-05-19 | Discharge: 2012-05-19 | Disposition: A | Discharge status: Home / Self Care | Payer: Medicare Other | Source: Ambulatory Visit | Attending: Adult Health | Admitting: Adult Health

## 2012-05-19 ENCOUNTER — Encounter: Payer: Self-pay | Admitting: Adult Health

## 2012-05-19 VITALS — BP 110/54 | HR 80 | Resp 18 | Ht 67.0 in | Wt 159.0 lb

## 2012-05-19 DIAGNOSIS — R0602 Shortness of breath: Secondary | ICD-10-CM

## 2012-05-19 DIAGNOSIS — J449 Chronic obstructive pulmonary disease, unspecified: Secondary | ICD-10-CM | POA: Insufficient documentation

## 2012-05-19 DIAGNOSIS — J9600 Acute respiratory failure, unspecified whether with hypoxia or hypercapnia: Secondary | ICD-10-CM

## 2012-05-19 DIAGNOSIS — R918 Other nonspecific abnormal finding of lung field: Secondary | ICD-10-CM | POA: Insufficient documentation

## 2012-05-19 DIAGNOSIS — I4892 Unspecified atrial flutter: Secondary | ICD-10-CM

## 2012-05-19 DIAGNOSIS — Z85118 Personal history of other malignant neoplasm of bronchus and lung: Secondary | ICD-10-CM | POA: Insufficient documentation

## 2012-05-19 DIAGNOSIS — D649 Anemia, unspecified: Secondary | ICD-10-CM

## 2012-05-19 DIAGNOSIS — I4891 Unspecified atrial fibrillation: Secondary | ICD-10-CM

## 2012-05-19 DIAGNOSIS — I10 Essential (primary) hypertension: Secondary | ICD-10-CM | POA: Insufficient documentation

## 2012-05-19 DIAGNOSIS — J4489 Other specified chronic obstructive pulmonary disease: Secondary | ICD-10-CM | POA: Insufficient documentation

## 2012-05-19 DIAGNOSIS — E119 Type 2 diabetes mellitus without complications: Secondary | ICD-10-CM | POA: Insufficient documentation

## 2012-05-19 LAB — BASIC METABOLIC PANEL
BUN: 32 mg/dL — ABNORMAL HIGH (ref 6–23)
Chloride: 93 mEq/L — ABNORMAL LOW (ref 96–112)
Glucose, Bld: 75 mg/dL (ref 70–99)
Potassium: 4.7 mEq/L (ref 3.5–5.3)

## 2012-05-19 LAB — CBC
HCT: 36.3 % — ABNORMAL LOW (ref 39.0–52.0)
MCV: 82.7 fL (ref 78.0–100.0)
RBC: 4.39 MIL/uL (ref 4.22–5.81)
WBC: 9.1 10*3/uL (ref 4.0–10.5)

## 2012-05-19 NOTE — Patient Instructions (Addendum)
Your physician recommends that you schedule a follow-up appointment in: 1 month with Dr Dietrich Pates  Referral will be made with Dr Juanetta Gosling for management of COPD  Your physician recommends that you return for lab work in: TODAY  A chest x-ray takes a picture of the organs and structures inside the chest, including the heart, lungs, and blood vessels. This test can show several things, including, whether the heart is enlarges; whether fluid is building up in the lungs; and whether pacemaker / defibrillator leads are still in place.

## 2012-05-19 NOTE — Assessment & Plan Note (Addendum)
Ventricular rate of 75 bpm. He continues on cardizem 180 mg daily and xarelto 15 mg. Labs will be completed to evaluate kidney fx and anemia.

## 2012-05-19 NOTE — Progress Notes (Signed)
HPI: Mr. Silverio is a 78 y/o patient of Dr.Rothbart we are following for ongoing assessment and treatment of chronic atrial flutter, on xarelto, COPD with history of right upper lobectomy on O2 at HS, history of pulmonary edema while hospitalized secondary to rapid atrial flutter, renal insufficiency, and diabetes. He comes today with increased work of breathing, frequent coughing and need to use O2 during the daytime as well as at night. He denies abdominal distention, LEE or chest pain. He has an indwelling catheter that is being managed by his PCP, Dr. Virgina Organ. His daughter has become concerned about his breathing status. His weight is down 4 lbs since discharge.   No Known Allergies  Current Outpatient Prescriptions  Medication Sig Dispense Refill  . albuterol (VENTOLIN HFA) 108 (90 BASE) MCG/ACT inhaler Inhale 2 puffs into the lungs every 6 (six) hours as needed.      . cyanocobalamin 1000 MCG tablet Take 1,000 mcg by mouth daily.      Marland Kitchen diltiazem (CARDIZEM CD) 180 MG 24 hr capsule Take 180 mg by mouth daily. New medication for your heart rate and your blood pressure.      . Fluticasone-Salmeterol (ADVAIR) 250-50 MCG/DOSE AEPB Inhale 1 puff into the lungs every 12 (twelve) hours.        . furosemide (LASIX) 40 MG tablet Take 40 mg by mouth daily.        Marland Kitchen ipratropium-albuterol (DUONEB) 0.5-2.5 (3) MG/3ML SOLN Take 3 mLs by nebulization 4 (four) times daily.      . iron polysaccharides (NIFEREX) 150 MG capsule Take 150 mg by mouth daily. Iron supplementation for your anemia.      . metFORMIN (GLUCOPHAGE) 1000 MG tablet Take 1,000 mg by mouth 2 (two) times daily.      . mometasone (NASONEX) 50 MCG/ACT nasal spray Place 2 sprays into the nose daily.      Marland Kitchen omeprazole (PRILOSEC) 20 MG capsule Take 20 mg by mouth daily.      . Rivaroxaban (XARELTO) 15 MG TABS tablet Take 15 mg by mouth daily.      . simvastatin (ZOCOR) 40 MG tablet Take 40 mg by mouth daily.       . Tamsulosin HCl (FLOMAX)  0.4 MG CAPS Take 0.4 mg by mouth daily.          Past Medical History  Diagnosis Date  . Myocardial infarction     30 years ago  . Shortness of breath   . Hyperlipidemia   . Hypertension   . Diabetes mellitus   . Arthritis   . COPD (chronic obstructive pulmonary disease)   . On home oxygen therapy     at night only  . Anemia 11/14/2011  . Colon polyps 08/2011    Per colonoscopy, Dr. Karilyn Cota.  Marland Kitchen BPH (benign prostatic hyperplasia)   . Urinary retention 11/15/2011  . Renal mass, right 11/16/2011  . Lung cancer   . Iron deficiency anemia 11/14/2011  . Atrial flutter with rapid ventricular response 04/29/2012    Past Surgical History  Procedure Date  . Cyst removed from face   . Colonoscopy 08/28/2011    Procedure: COLONOSCOPY;  Surgeon: Malissa Hippo, MD;  Location: [facility];  Service: Endoscopy;  Laterality: N/A;  . Doppler echocardiography 04/2012    Ejection fraction 55%.    ZOX:WRUEAV of systems complete and found to be negative unless listed above  PHYSICAL EXAM BP 110/54  Pulse 80  Resp 18  Ht 5\' 7"  (1.702  m)  Wt 159 lb (72.122 kg)  BMI 24.90 kg/m2  General: Well developed, well nourished, in no acute distress, wearing O2.  Head: Eyes PERRLA, No xanthomas.   Normal cephalic and atramatic  Lungs: Bilateral rales, rhonchi, and mild wheezes in the lower lobes. Heart: HRRR S1 S2, without MRG.  Pulses are 2+ & equal.            No carotid bruit. No JVD.  No abdominal bruits. No femoral bruits. Abdomen: Bowel sounds are positive, abdomen soft and non-tender without masses or                  Hernia's noted. Msk:  Back normal, normal gait. Normal strength and tone for age. Extremities: No clubbing, cyanosis or edema.  DP +1 Neuro: Alert and oriented X 3. Psych:  Good affect, responds appropriately  AOZ:HYQMVH flutter 2:1 AV block, with ventricular rate of 75 bpm,   ASSESSMENT AND PLAN

## 2012-05-19 NOTE — Assessment & Plan Note (Signed)
He will be referred to Dr. Juanetta Gosling for continued pulmonary care. He is on O2 and has been for 5 years per PCP but no pulmonary follow-up since hospitalization. He will have a CXR completed to evaluate for recurrence of CHF, although no edema or JVD is seen. His breathing status has worsened.

## 2012-05-20 ENCOUNTER — Telehealth: Payer: Self-pay

## 2012-05-20 DIAGNOSIS — Z85118 Personal history of other malignant neoplasm of bronchus and lung: Secondary | ICD-10-CM

## 2012-05-20 NOTE — Progress Notes (Signed)
UR Chart Review Completed  

## 2012-05-20 NOTE — Telephone Encounter (Signed)
**Note De-Identified Edsel Shives Obfuscation** Message copied by Demetrios Loll on Wed May 20, 2012  9:09 AM ------      Message from: Jodelle Gross      Created: Tue May 19, 2012  4:00 PM      Regarding: CT scan       I have called the patient but there was no answer. Left message  to let him know that there was no evidence on his CXR to show CHF. However, it was recommended that he have a CT to evaluate him further for recurrence of tumor (has history of lung CA). He is to be seen by Dr. Juanetta Gosling on follow-up referral. I would like him to have the CT scan with contrast in anticipation of his visit with Dr. Juanetta Gosling.  He is unaware of the need to have CT. Please try calling him again to have CT done when he can.       ----- Message -----         From: Rad Results In Interface         Sent: 05/19/2012   3:00 PM           To: Jodelle Gross, NP

## 2012-05-21 ENCOUNTER — Telehealth: Payer: Self-pay

## 2012-05-21 ENCOUNTER — Other Ambulatory Visit: Payer: Self-pay

## 2012-05-21 DIAGNOSIS — Z85118 Personal history of other malignant neoplasm of bronchus and lung: Secondary | ICD-10-CM

## 2012-05-21 MED ORDER — FUROSEMIDE 20 MG PO TABS: 20.0000 mg | ORAL_TABLET | Freq: Every day | ORAL | Status: DC

## 2012-05-21 NOTE — Telephone Encounter (Signed)
**Note De-Identified Kahlia Lagunes Obfuscation** Message copied by Demetrios Loll on Thu May 21, 2012 11:02 AM ------      Message from: Jodelle Gross      Created: Thu May 21, 2012 10:08 AM       Decrease lasix to 20 mg daily.

## 2012-05-21 NOTE — Telephone Encounter (Signed)
**Note De-Identified Ryder Chesmore Obfuscation** Pt. and his daughter advised to decrease pt's Furosemide to 20 mg daily, they both verbalized understanding. [facility] [place] to [facility]./LV

## 2012-05-27 ENCOUNTER — Ambulatory Visit (HOSPITAL_COMMUNITY)
Admission: RE | Admit: 2012-05-27 | Discharge: 2012-05-27 | Disposition: A | Discharge status: Home / Self Care | Payer: Medicare Other | Source: Ambulatory Visit | Attending: Adult Health | Admitting: Adult Health

## 2012-05-27 DIAGNOSIS — R918 Other nonspecific abnormal finding of lung field: Secondary | ICD-10-CM | POA: Insufficient documentation

## 2012-05-27 DIAGNOSIS — I2789 Other specified pulmonary heart diseases: Secondary | ICD-10-CM | POA: Insufficient documentation

## 2012-05-27 DIAGNOSIS — I499 Cardiac arrhythmia, unspecified: Secondary | ICD-10-CM | POA: Insufficient documentation

## 2012-05-27 DIAGNOSIS — Z85118 Personal history of other malignant neoplasm of bronchus and lung: Secondary | ICD-10-CM | POA: Insufficient documentation

## 2012-06-02 ENCOUNTER — Telehealth: Payer: Self-pay | Admitting: Adult Health

## 2012-06-02 NOTE — Telephone Encounter (Signed)
Results of testing done last week. / tg

## 2012-06-03 ENCOUNTER — Telehealth: Payer: Self-pay

## 2012-06-03 NOTE — Telephone Encounter (Signed)
**Note De-Identified Sherilyn Windhorst Obfuscation** Per pt's daughter request, a copy of chest CT faxed to Dr. Molinda Bailiff (lung specialist) office in [place], [place]./LV

## 2012-06-03 NOTE — Telephone Encounter (Signed)
**Note De-Identified Brendan Hall Obfuscation** Per Dr. Eden Emms, results of chest ct faxed to Dr. Virgina Organ, pt's pcp, and Dr. Cleone Slim, pt's oncologist. Pt. Aware./LV

## 2012-06-18 ENCOUNTER — Encounter: Payer: Self-pay | Admitting: Adult Health

## 2012-06-18 ENCOUNTER — Ambulatory Visit (INDEPENDENT_AMBULATORY_CARE_PROVIDER_SITE_OTHER): Payer: Medicare Other | Admitting: Adult Health

## 2012-06-18 VITALS — BP 120/70 | HR 73 | Ht 67.0 in | Wt 165.5 lb

## 2012-06-18 DIAGNOSIS — Z85118 Personal history of other malignant neoplasm of bronchus and lung: Secondary | ICD-10-CM

## 2012-06-18 DIAGNOSIS — I4892 Unspecified atrial flutter: Secondary | ICD-10-CM

## 2012-06-18 DIAGNOSIS — E7800 Pure hypercholesterolemia, unspecified: Secondary | ICD-10-CM

## 2012-06-18 NOTE — Assessment & Plan Note (Signed)
He is followed by Dr.Quereshi for labs.

## 2012-06-18 NOTE — Assessment & Plan Note (Signed)
Excellent control of rate on diltiazem. I have given him a discount coupon for Xarelto and requested samples of the 15 mg dose from the [place] office to assist him with cost issues. Will see him again in 4 months.

## 2012-06-18 NOTE — Patient Instructions (Addendum)
Call our office tomorrow to check on the status of Xarelto samples.  Your physician recommends that you schedule a follow-up appointment in 4 months with Joni Reining, NP.

## 2012-06-18 NOTE — Assessment & Plan Note (Signed)
Abnormal CT scan as discussed above. He is encouraged to keep appointment with Dr. Juanetta Gosling for continue pulmonary management. He may need referral to oncologist once evaluated with his history of lung CA.

## 2012-06-18 NOTE — Progress Notes (Signed)
HPI: Mr. Brendan Hall is a 78 y/o patient of Dr.Rothbart we are following for ongoing assessment and treatment of chronic atrial flutter, on xarelto, COPD with history of right upper lobectomy on O2 at HS, history of pulmonary edema while hospitalized secondary to rapid atrial flutter, renal insufficiency, and diabetes. He comes today with increased work of breathing, frequent coughing and need to use O2 during the daytime as well as at night. He denies abdominal distention, LEE or chest pain. He has an indwelling catheter that is being managed by his PCP, Dr. Virgina Organ. His daughter has become concerned about his breathing status. His weight is down 4 lbs since discharge.     I had a CT scan of his chest completed on last visit. It demonstrated Confluent soft tissue in the right suprahilar region and right hilum, with surrounding bronchiectasis and architectural distortion. While these findings may be due to radiation therapy in this patient with a history of lung cancer, recurrent disease cannot be excluded. Comparison with prior exams would be helpful, if available.  Peribronchovascular nodularity throughout the right lung, with debris in the bronchus intermedius. Findings favor an acute infectious bronchiolitis. Pulmonary arterial hypertension and extensive coronary artery calcification. Scattered pulmonary nodules measure up to 6 mm. I have referred him to Dr. Juanetta Gosling and appointment is planned for next month.  He continues essentially the same. No new complaints.   No Known Allergies  Current Outpatient Prescriptions  Medication Sig Dispense Refill  . albuterol (VENTOLIN HFA) 108 (90 BASE) MCG/ACT inhaler Inhale 2 puffs into the lungs every 6 (six) hours as needed.      . cyanocobalamin 1000 MCG tablet Take 1,000 mcg by mouth daily.      Marland Kitchen diltiazem (CARDIZEM CD) 180 MG 24 hr capsule Take 180 mg by mouth daily. New medication for your heart rate and your blood pressure.      .  Fluticasone-Salmeterol (ADVAIR) 250-50 MCG/DOSE AEPB Inhale 1 puff into the lungs every 12 (twelve) hours.        . furosemide (LASIX) 20 MG tablet Take 1 tablet (20 mg total) by mouth daily.  30 tablet  3  . ipratropium-albuterol (DUONEB) 0.5-2.5 (3) MG/3ML SOLN Take 3 mLs by nebulization 4 (four) times daily.      . iron polysaccharides (NIFEREX) 150 MG capsule Take 150 mg by mouth daily. Iron supplementation for your anemia.      . metFORMIN (GLUCOPHAGE) 1000 MG tablet Take 1,000 mg by mouth 2 (two) times daily.      . mometasone (NASONEX) 50 MCG/ACT nasal spray Place 2 sprays into the nose daily.      Marland Kitchen omeprazole (PRILOSEC) 20 MG capsule Take 20 mg by mouth daily.      . Rivaroxaban (XARELTO) 15 MG TABS tablet Take 15 mg by mouth daily.      . silodosin (RAPAFLO) 8 MG CAPS capsule Take 8 mg by mouth daily with breakfast.      . simvastatin (ZOCOR) 40 MG tablet Take 40 mg by mouth daily.         Past Medical History  Diagnosis Date  . Myocardial infarction     30 years ago  . Shortness of breath   . Hyperlipidemia   . Hypertension   . Diabetes mellitus   . Arthritis   . COPD (chronic obstructive pulmonary disease)   . On home oxygen therapy     at night only  . Anemia 11/14/2011  . Colon polyps 08/2011  Per colonoscopy, Dr. Karilyn Cota.  Marland Kitchen BPH (benign prostatic hyperplasia)   . Urinary retention 11/15/2011  . Renal mass, right 11/16/2011  . Lung cancer   . Iron deficiency anemia 11/14/2011  . Atrial flutter with rapid ventricular response 04/29/2012    Past Surgical History  Procedure Date  . Cyst removed from face   . Colonoscopy 08/28/2011    Procedure: COLONOSCOPY;  Surgeon: Malissa Hippo, MD;  Location: [facility];  Service: Endoscopy;  Laterality: N/A;  . Doppler echocardiography 04/2012    Ejection fraction 55%.    ZOX:WRUEAV of systems complete and found to be negative unless listed above  PHYSICAL EXAM BP 120/70  Pulse 73  Ht 5\' 7"  (1.702 m)  Wt 165 lb 8 oz  (75.07 kg)  BMI 25.92 kg/m2  General: Well developed, well nourished, in no acute distress, wearing O2.  Head: Eyes PERRLA, No xanthomas.   Normal cephalic and atramatic  Lungs: Bilateral rales, rhonchi, and mild wheezes in the lower lobes. Heart: HRRR S1 S2, without MRG.  Pulses are 2+ & equal.            No carotid bruit. No JVD.  No abdominal bruits. No femoral bruits. Abdomen: Bowel sounds are positive, abdomen soft and non-tender without masses or                  Hernia's noted. Msk:  Back normal, normal gait. Normal strength and tone for age. Extremities: No clubbing, cyanosis or edema.  DP +1 Neuro: Alert and oriented X 3. Psych:  Good affect, responds appropriately  WUJ:WJXBJY flutter 2:1 AV block, with ventricular rate of 75 bpm,   ASSESSMENT AND PLAN

## 2012-06-29 ENCOUNTER — Other Ambulatory Visit: Payer: Self-pay

## 2012-06-29 DIAGNOSIS — J449 Chronic obstructive pulmonary disease, unspecified: Secondary | ICD-10-CM

## 2012-07-07 ENCOUNTER — Telehealth: Payer: Self-pay | Admitting: Adult Health

## 2012-07-07 NOTE — Telephone Encounter (Signed)
PT WAS PUT ON DILTIAZEM 180MG  IN HOSPITAL AND NEEDS [facility] [place]

## 2012-07-08 ENCOUNTER — Emergency Department (HOSPITAL_COMMUNITY)
Admission: EM | Admit: 2012-07-08 | Discharge: 2012-07-08 | Disposition: A | Discharge status: Home / Self Care | Payer: Medicare Other | Attending: Emergency Medicine | Admitting: Emergency Medicine

## 2012-07-08 ENCOUNTER — Other Ambulatory Visit: Payer: Self-pay

## 2012-07-08 ENCOUNTER — Emergency Department (HOSPITAL_COMMUNITY): Payer: Medicare Other

## 2012-07-08 ENCOUNTER — Ambulatory Visit (HOSPITAL_COMMUNITY)
Admission: RE | Admit: 2012-07-08 | Discharge: 2012-07-08 | Disposition: A | Discharge status: Home / Self Care | Payer: Medicare Other | Source: Ambulatory Visit | Attending: Pulmonary Disease | Admitting: Pulmonary Disease

## 2012-07-08 ENCOUNTER — Encounter (HOSPITAL_COMMUNITY): Payer: Self-pay | Admitting: *Deleted

## 2012-07-08 DIAGNOSIS — J40 Bronchitis, not specified as acute or chronic: Secondary | ICD-10-CM

## 2012-07-08 DIAGNOSIS — J4489 Other specified chronic obstructive pulmonary disease: Secondary | ICD-10-CM | POA: Insufficient documentation

## 2012-07-08 DIAGNOSIS — I10 Essential (primary) hypertension: Secondary | ICD-10-CM | POA: Insufficient documentation

## 2012-07-08 DIAGNOSIS — R000 Tachycardia, unspecified: Secondary | ICD-10-CM | POA: Insufficient documentation

## 2012-07-08 DIAGNOSIS — I252 Old myocardial infarction: Secondary | ICD-10-CM | POA: Insufficient documentation

## 2012-07-08 DIAGNOSIS — E119 Type 2 diabetes mellitus without complications: Secondary | ICD-10-CM | POA: Insufficient documentation

## 2012-07-08 DIAGNOSIS — IMO0002 Reserved for concepts with insufficient information to code with codable children: Secondary | ICD-10-CM

## 2012-07-08 DIAGNOSIS — Z9981 Dependence on supplemental oxygen: Secondary | ICD-10-CM | POA: Insufficient documentation

## 2012-07-08 DIAGNOSIS — M129 Arthropathy, unspecified: Secondary | ICD-10-CM | POA: Insufficient documentation

## 2012-07-08 DIAGNOSIS — D509 Iron deficiency anemia, unspecified: Secondary | ICD-10-CM | POA: Insufficient documentation

## 2012-07-08 DIAGNOSIS — Z85118 Personal history of other malignant neoplasm of bronchus and lung: Secondary | ICD-10-CM | POA: Insufficient documentation

## 2012-07-08 DIAGNOSIS — J449 Chronic obstructive pulmonary disease, unspecified: Secondary | ICD-10-CM | POA: Insufficient documentation

## 2012-07-08 DIAGNOSIS — E785 Hyperlipidemia, unspecified: Secondary | ICD-10-CM | POA: Insufficient documentation

## 2012-07-08 LAB — CBC WITH DIFFERENTIAL/PLATELET
Eosinophils Absolute: 0 10*3/uL (ref 0.0–0.7)
Eosinophils Relative: 0 % (ref 0–5)
HCT: 34.1 % — ABNORMAL LOW (ref 39.0–52.0)
Lymphs Abs: 0.9 10*3/uL (ref 0.7–4.0)
MCH: 25.1 pg — ABNORMAL LOW (ref 26.0–34.0)
MCV: 84 fL (ref 78.0–100.0)
Monocytes Absolute: 1 10*3/uL (ref 0.1–1.0)
Platelets: 265 10*3/uL (ref 150–400)
RBC: 4.06 MIL/uL — ABNORMAL LOW (ref 4.22–5.81)
RDW: 15.6 % — ABNORMAL HIGH (ref 11.5–15.5)

## 2012-07-08 LAB — COMPREHENSIVE METABOLIC PANEL
ALT: 7 U/L (ref 0–53)
Calcium: 9.9 mg/dL (ref 8.4–10.5)
Creatinine, Ser: 1.45 mg/dL — ABNORMAL HIGH (ref 0.50–1.35)
GFR calc Af Amer: 51 mL/min — ABNORMAL LOW (ref >90)
GFR calc non Af Amer: 44 mL/min — ABNORMAL LOW (ref >90)
Glucose, Bld: 155 mg/dL — ABNORMAL HIGH (ref 70–99)
Sodium: 137 mEq/L (ref 135–145)
Total Protein: 7.6 g/dL (ref 6.0–8.3)

## 2012-07-08 MED ORDER — DILTIAZEM HCL 50 MG/10ML IV SOLN: 5.0000 mg | Freq: Once | INTRAVENOUS | Status: DC

## 2012-07-08 MED ORDER — LEVOFLOXACIN 500 MG PO TABS: 500.0000 mg | ORAL_TABLET | Freq: Every day | ORAL | Status: AC

## 2012-07-08 MED ORDER — DILTIAZEM HCL 25 MG/5ML IV SOLN
INTRAVENOUS | Status: AC
  Administered 2012-07-08: 5 mg
  Filled 2012-07-08: qty 5

## 2012-07-08 MED ORDER — DILTIAZEM HCL ER COATED BEADS 180 MG PO CP24: 180.0000 mg | ORAL_CAPSULE | Freq: Every day | ORAL | Status: DC

## 2012-07-08 NOTE — ED Provider Notes (Signed)
History  This chart was scribed for Brendan Lennert, MD by Ladona Ridgel Day. This patient was seen in room APA02/AP[facility]02 and the patient's care was started at 0958.   CSN: [id number]  Arrival date & time 07/08/12  4098   First MD Initiated Contact with Patient 07/08/12 1000      Chief Complaint  Patient presents with  . Tachycardia   Patient is a 78 y.o. male presenting with palpitations. The history is provided by the patient. No language interpreter was used.  Palpitations  This is a new problem. The current episode started less than 1 hour ago. The problem occurs rarely. The problem has not changed since onset.Pertinent negatives include no chest pain, no abdominal pain, no headaches, no back pain and no cough. Treatments tried: He took his inhaler and cardiazem this AM. The treatment provided no relief.   Brendan Hall is a 78 y.o. male who presents to the Emergency Department complaining of constant tachycardia this AM per respiratory therapist since he was supposed to begin his pulmonary function test but did not because of HR between 140-150 bpm. He states he feels pretty normal and in NAD. He states his mild SOB is at baseline and he regularly uses 2L O2 at home. He took his inhaler this AM as well as his Cardizem. He denies having any current pain or recent injuries/illnesses.   Past Medical History  Diagnosis Date  . Myocardial infarction     30 years ago  . Shortness of breath   . Hyperlipidemia   . Hypertension   . Diabetes mellitus   . Arthritis   . COPD (chronic obstructive pulmonary disease)   . On home oxygen therapy     at night only  . Anemia 11/14/2011  . Colon polyps 08/2011    Per colonoscopy, Dr. Karilyn Cota.  Marland Kitchen BPH (benign prostatic hyperplasia)   . Urinary retention 11/15/2011  . Renal mass, right 11/16/2011  . Lung cancer   . Iron deficiency anemia 11/14/2011  . Atrial flutter with rapid ventricular response 04/29/2012    Past Surgical History  Procedure Date   . Cyst removed from face   . Colonoscopy 08/28/2011    Procedure: COLONOSCOPY;  Surgeon: Malissa Hippo, MD;  Location: [facility];  Service: Endoscopy;  Laterality: N/A;  . Doppler echocardiography 04/2012    Ejection fraction 55%.    Family History  Problem Relation Age of Onset  . Cancer Mother   . Cancer Sister   . Cancer Brother     History  Substance Use Topics  . Smoking status: Former Smoker -- 1.5 packs/day for 60 years    Types: Cigarettes    Quit date: 08/06/2008  . Smokeless tobacco: Never Used  . Alcohol Use: No     occasional       Review of Systems  Constitutional: Negative for fatigue.  HENT: Negative for congestion, sinus pressure and ear discharge.   Eyes: Negative for discharge.  Respiratory: Negative for cough.   Cardiovascular: Positive for palpitations. Negative for chest pain.       Tachycardia per respiratory therapist this AM.  Gastrointestinal: Negative for abdominal pain and diarrhea.  Genitourinary: Negative for frequency and hematuria.  Musculoskeletal: Negative for back pain.  Skin: Negative for rash.  Neurological: Negative for seizures and headaches.  Hematological: Negative.   Psychiatric/Behavioral: Negative for hallucinations.  All other systems reviewed and are negative.    Allergies  Review of patient's allergies indicates no known allergies.  Home Medications   Current Outpatient Rx  Name Route Sig Dispense Refill  . ALBUTEROL SULFATE HFA 108 (90 BASE) MCG/ACT IN AERS Inhalation Inhale 2 puffs into the lungs every 6 (six) hours as needed.    . CYANOCOBALAMIN 1000 MCG PO TABS Oral Take 1,000 mcg by mouth daily.    Marland Kitchen DILTIAZEM HCL ER COATED BEADS 180 MG PO CP24 Oral Take 1 capsule (180 mg total) by mouth daily. New medication for your heart rate and your blood pressure. 30 capsule 6  . FLUTICASONE-SALMETEROL 250-50 MCG/DOSE IN AEPB Inhalation Inhale 1 puff into the lungs every 12 (twelve) hours.      . FUROSEMIDE 20 MG PO  TABS Oral Take 1 tablet (20 mg total) by mouth daily. 30 tablet 3    Dose decrease  . IPRATROPIUM-ALBUTEROL 0.5-2.5 (3) MG/3ML IN SOLN Nebulization Take 3 mLs by nebulization 4 (four) times daily.    Marland Kitchen POLYSACCHARIDE IRON COMPLEX 150 MG PO CAPS Oral Take 150 mg by mouth daily. Iron supplementation for your anemia.    Marland Kitchen METFORMIN HCL 1000 MG PO TABS Oral Take 1,000 mg by mouth 2 (two) times daily.    . MOMETASONE FUROATE 50 MCG/ACT NA SUSP Nasal Place 2 sprays into the nose daily.    Marland Kitchen OMEPRAZOLE 20 MG PO CPDR Oral Take 20 mg by mouth daily.    Marland Kitchen RIVAROXABAN 15 MG PO TABS Oral Take 15 mg by mouth daily.    Marland Kitchen SILODOSIN 8 MG PO CAPS Oral Take 8 mg by mouth daily with breakfast.    . SIMVASTATIN 40 MG PO TABS Oral Take 40 mg by mouth daily.       Triage Vitals: BP 116/85  Pulse 111  Temp 99.2 F (37.3 C) (Oral)  Resp 39  SpO2 100%  Physical Exam  Nursing note and vitals reviewed. Constitutional: He is oriented to person, place, and time. He appears well-developed.  HENT:  Head: Normocephalic and atraumatic.  Eyes: Conjunctivae and EOM are normal. No scleral icterus.  Neck: Neck supple. No thyromegaly present.  Cardiovascular: Normal rate.  Exam reveals no gallop and no friction rub.   No murmur heard.      Rapid irregular hear beat.   Pulmonary/Chest: Effort normal. No stridor. He has wheezes. He has no rales. He exhibits no tenderness.       Moderate bilateral wheezing.   Abdominal: He exhibits no distension. There is no tenderness. There is no rebound.  Musculoskeletal: Normal range of motion. He exhibits no edema.  Lymphadenopathy:    He has no cervical adenopathy.  Neurological: He is oriented to person, place, and time. Coordination normal.  Skin: Skin is warm. No rash noted. No erythema.  Psychiatric: He has a normal mood and affect. His behavior is normal.    ED Course  Procedures (including critical care time) DIAGNOSTIC STUDIES: Oxygen Saturation is 97% on room air,  adequate by my interpretation.    COORDINATION OF CARE: At 1020 AM Discussed treatment plan with patient which includes cardizem, CXR, and blood work. Patient agrees.   Labs Reviewed - No data to display No results found. I spoke with dr. Juanetta Gosling who will follow pt as out pt  No diagnosis found.    Date: 07/08/2012  Rate:118  Rhythm: atrial fibrillation  QRS Axis: normal  Intervals: normal  ST/T Wave abnormalities: nonspecific ST changes  Conduction Disutrbances:none  Narrative Interpretation:   Old EKG Reviewed: unchanged   MDM  The chart was scribed for me  under my direct supervision.  I personally performed the history, physical, and medical decision making and all procedures in the evaluation of this patient.Brendan Lennert, MD 07/08/12 [phone]

## 2012-07-08 NOTE — Telephone Encounter (Signed)
**Note De-Identified Kyndahl Jablon Obfuscation** Left detailed message on VM that [facility]em [facility] [place] [facility]./LV

## 2012-07-08 NOTE — ED Notes (Signed)
Pt escorted to ED by Resp. Therapist. Pt was in RT having a pulmonary function test and HR went into 140's and 150's per RT. Pt states he is not more SOB than usual and that he feels fine. NAD. PT is on O2 at home at all times.

## 2012-07-08 NOTE — ED Notes (Signed)
Verbal order Cardizem 5mg  IV push. Dr. Estell Harpin

## 2012-07-08 NOTE — ED Notes (Signed)
Pt continued on 3L 02 in ED. Pt states usually on 2-3 L at home intermittant but most of time.

## 2012-07-13 ENCOUNTER — Ambulatory Visit (INDEPENDENT_AMBULATORY_CARE_PROVIDER_SITE_OTHER): Payer: Medicare Other | Admitting: Adult Health

## 2012-07-13 ENCOUNTER — Encounter: Payer: Self-pay | Admitting: Adult Health

## 2012-07-13 VITALS — BP 130/90 | HR 143 | Ht 67.0 in | Wt 169.2 lb

## 2012-07-13 DIAGNOSIS — I4892 Unspecified atrial flutter: Secondary | ICD-10-CM

## 2012-07-13 MED ORDER — DILTIAZEM HCL ER COATED BEADS 180 MG PO CP24: 180.0000 mg | ORAL_CAPSULE | Freq: Two times a day (BID) | ORAL | Status: DC

## 2012-07-13 MED ORDER — DILTIAZEM HCL 25 MG/5ML IV SOLN
20.0000 mg | INTRAVENOUS | Status: DC | PRN
  Filled 2012-07-13 (×3): qty 15

## 2012-07-13 NOTE — Patient Instructions (Addendum)
Pt needs to See Vashti Hey, RN in the coumadin clinic in the next 20-30 days.  He will stop Xarelto and start coumadin.  Your physician recommends that you schedule a follow-up appointment with the nurse in 1 week for a rhythm strip and blood pressure check.  Your physician recommends that you schedule a follow-up appointment in: 1 months with Joni Reining, NP.

## 2012-07-13 NOTE — Assessment & Plan Note (Signed)
The patient presents to the office today with atrial flutter a rate of 143 beats per minute. He is symptomatic for shortness of breath and generalized fatigue. I discussed with the patient the need to either return to the ER or possibly be treated here in the office with IV Cardizem. I have asked Dr. Dietrich Pates to see this patient with me and for his recommendations. After reviewing the patient's chart, ER visit, and recent echocardiogram, Dr. Dietrich Pates has recommended that the patient be treated here in the office with an IV Cardizem bolus. The patient was placed on a cardiac monitor, and given an initial dose of 10 mg of IV Cardizem. After approximately 2-3 minutes the patient's heart rate normalized her rhythm strip to atrial flutter with a rate in the 70s. The patient was then walked in the hall to evaluate his heart rate response. He had a transient increase in heart rate of 140 bpm once returning beats back to a seated position. This quickly normalized to a lower rate after rest.     The patient will be sent home today, we will increase his Cardizem 280 mg twice a day. He will return in a week for a rhythm strip and blood pressure evaluation. If he begins to have a rapid heart rate, increased fatigue he is advised to be seen in the ER for admission. Dr. Dietrich Pates had a discussion with the patient and his daughter concerning treatment options. There was a suggestion of evaluation by EP and possible atrial flutter ablation, DCCV, and medical therapy. Recent benefits were discussed with the patient and his family. At this moment in time the patient is elected to be treated medically. Other options may be discussed if he has recurrence of rapid rhythm. Will follow him closely in one week.

## 2012-07-13 NOTE — Progress Notes (Signed)
HPI: Mr. cooks is an 78 year old patient of Dr. Dietrich Pates who we are following for ongoing assessment and treatment of chronic atrial flutter. He has a history of COPD with right upper long lobectomy and is oxygen dependent. He will set a history of pulmonary edema in the setting of rapid atrial flutter, renal insufficiency, and diabetes. He was seen today by Dr. Juanetta Gosling on followup, with known history of lung mass, and was found to have a rapid heart rate. He was added on to our clinic today at the request of Dr. Juanetta Gosling for further evaluation.   The patient was seen in [facility] on 07/08/2012 for shortness of breath and rapid heart rhythm. EKG revealed a atrial flutter with variable AV block with a rate of 118 beats per minute. The patient per ER records did have a heart rate up to 144 beats per minute. His labs have been reviewed and found to be within normal limits with the exception of elevated white blood cells of 12.3. Chest x-ray revealed interstitial prominence and suggestion of some vague nodularity in the mid and lower right lung and lower left lung. The findings  represented progressively worsening bronchitis and probable developing bronchopneumonia.    He was treated with IV Cardizem 5 mg, x2 doses about 15 minutes apart. Heart rate did normalize to 99 beats per minute. However heart rate did return to a more rapid rate but the patient refused admission. He was to followup with his primary care physician. He was given prescription for Levaquin. Unfortunately the patient continued to have a rapid heart rate over the last 5 days until he was seen by Dr. Juanetta Gosling today. He is complaining of some chest pressure and increased work of breathing. He is on oxygen via nasal cannula chronically. He did take his usual dose of Cardizem (180 mg) this a.m. along with his other medications.     No Known Allergies  Current Outpatient Prescriptions  Medication Sig Dispense Refill  . albuterol (VENTOLIN HFA) 108 (90  BASE) MCG/ACT inhaler Inhale 2 puffs into the lungs every 6 (six) hours as needed.      . cyanocobalamin 1000 MCG tablet Take 1,000 mcg by mouth daily.      Marland Kitchen diltiazem (CARDIZEM CD) 180 MG 24 hr capsule Take 1 capsule (180 mg total) by mouth 2 (two) times daily. New medication for your heart rate and your blood pressure.  60 capsule  3  . Fluticasone-Salmeterol (ADVAIR) 250-50 MCG/DOSE AEPB Inhale 1 puff into the lungs every 12 (twelve) hours.        . furosemide (LASIX) 40 MG tablet Take 20 mg by mouth daily.      Marland Kitchen ipratropium-albuterol (DUONEB) 0.5-2.5 (3) MG/3ML SOLN Take 3 mLs by nebulization 4 (four) times daily.      . iron polysaccharides (NIFEREX) 150 MG capsule Take 150 mg by mouth daily. Iron supplementation for your anemia.      Marland Kitchen levofloxacin (LEVAQUIN) 500 MG tablet Take 1 tablet (500 mg total) by mouth daily.  10 tablet  0  . metFORMIN (GLUCOPHAGE) 1000 MG tablet Take 1,000 mg by mouth 2 (two) times daily.      . mometasone (NASONEX) 50 MCG/ACT nasal spray Place 2 sprays into the nose daily.      Marland Kitchen omeprazole (PRILOSEC) 20 MG capsule Take 20 mg by mouth daily.      . Rivaroxaban (XARELTO) 15 MG TABS tablet Take 15 mg by mouth daily.      . silodosin (RAPAFLO)  8 MG CAPS capsule Take 8 mg by mouth daily with breakfast.      . simvastatin (ZOCOR) 40 MG tablet Take 40 mg by mouth daily.        No current facility-administered medications for this visit.   Facility-Administered Medications Ordered in Other Visits  Medication Dose Route Frequency Provider Last Rate Last Dose  . diltiazem (CARDIZEM) injection 20 mg  20 mg Intravenous PRN Kathlen Brunswick, MD        Past Medical History  Diagnosis Date  . Myocardial infarction     30 years ago  . Shortness of breath   . Hyperlipidemia   . Hypertension   . Diabetes mellitus   . Arthritis   . COPD (chronic obstructive pulmonary disease)   . On home oxygen therapy     at night only  . Anemia 11/14/2011  . Colon polyps 08/2011     Per colonoscopy, Dr. Karilyn Cota.  Marland Kitchen BPH (benign prostatic hyperplasia)   . Urinary retention 11/15/2011  . Renal mass, right 11/16/2011  . Lung cancer   . Iron deficiency anemia 11/14/2011  . Atrial flutter with rapid ventricular response 04/29/2012    Past Surgical History  Procedure Date  . Cyst removed from face   . Colonoscopy 08/28/2011    Procedure: COLONOSCOPY;  Surgeon: Malissa Hippo, MD;  Location: [facility];  Service: Endoscopy;  Laterality: N/A;  . Doppler echocardiography 04/2012    Ejection fraction 55%.    ZOX:WRUEAV of systems complete and found to be negative unless listed above  PHYSICAL EXAM BP 130/90  Pulse 143  Ht 5\' 7"  (1.702 m)  Wt 169 lb 4 oz (76.771 kg)  BMI 26.51 kg/m2  General: Well developed, well nourished, in no acute distress Head: Eyes PERRLA, No xanthomas.   Normal cephalic and atramatic  Lungs: Bilateral crackles, diminished on the left. Heart: HRRR S1 S2, tachycardic,  without MRG.  Pulses are 2+ & equal.            No carotid bruit. No JVD.  No abdominal bruits. No femoral bruits. Abdomen: Bowel sounds are positive, abdomen soft and non-tender without masses or                  Hernia's noted. Msk:  Back normal, normal gait. Normal strength and tone for age. Extremities: No clubbing, cyanosis or edema.  DP +1 Neuro: Alert and oriented X 3. Psych:  Good affect, responds appropriately  EKG: Atrial flutter with rate of 143 beats per minute.  ASSESSMENT AND PLAN

## 2012-07-14 NOTE — Addendum Note (Signed)
Addended by: Reather Laurence A on: 07/14/2012 10:33 AM   Modules accepted: Orders

## 2012-07-20 ENCOUNTER — Ambulatory Visit (INDEPENDENT_AMBULATORY_CARE_PROVIDER_SITE_OTHER): Payer: Medicare Other | Admitting: *Deleted

## 2012-07-20 VITALS — BP 130/78 | HR 81 | Ht 67.0 in | Wt 167.0 lb

## 2012-07-20 DIAGNOSIS — I4892 Unspecified atrial flutter: Secondary | ICD-10-CM

## 2012-07-20 NOTE — Progress Notes (Signed)
Patient presents for rhythm strip and BP check s/p increase in cardizem.  States feeling great and has no complaints.

## 2012-08-03 ENCOUNTER — Encounter: Payer: Self-pay | Admitting: Adult Health

## 2012-08-03 ENCOUNTER — Ambulatory Visit (INDEPENDENT_AMBULATORY_CARE_PROVIDER_SITE_OTHER): Payer: Medicare Other | Admitting: Adult Health

## 2012-08-03 ENCOUNTER — Ambulatory Visit (HOSPITAL_COMMUNITY)
Admission: RE | Admit: 2012-08-03 | Discharge: 2012-08-03 | Disposition: A | Discharge status: Home / Self Care | Payer: Medicare Other | Source: Ambulatory Visit | Attending: Adult Health | Admitting: Adult Health

## 2012-08-03 VITALS — BP 149/61 | HR 122 | Ht 67.0 in | Wt 169.0 lb

## 2012-08-03 DIAGNOSIS — R918 Other nonspecific abnormal finding of lung field: Secondary | ICD-10-CM | POA: Insufficient documentation

## 2012-08-03 DIAGNOSIS — R000 Tachycardia, unspecified: Secondary | ICD-10-CM

## 2012-08-03 DIAGNOSIS — Z85118 Personal history of other malignant neoplasm of bronchus and lung: Secondary | ICD-10-CM

## 2012-08-03 DIAGNOSIS — J441 Chronic obstructive pulmonary disease with (acute) exacerbation: Secondary | ICD-10-CM

## 2012-08-03 DIAGNOSIS — I4892 Unspecified atrial flutter: Secondary | ICD-10-CM

## 2012-08-03 DIAGNOSIS — R0602 Shortness of breath: Secondary | ICD-10-CM | POA: Insufficient documentation

## 2012-08-03 DIAGNOSIS — C3490 Malignant neoplasm of unspecified part of unspecified bronchus or lung: Secondary | ICD-10-CM | POA: Insufficient documentation

## 2012-08-03 NOTE — Assessment & Plan Note (Signed)
He will continue on oxygen 2 L per nasal cannula deceased in using at home. I explained to him that his COPD is progressive. He is not smoking. He will continue his albuterol inhalers.

## 2012-08-03 NOTE — Assessment & Plan Note (Signed)
Heart rate is currently well controlled on increased dose of Cardizem 180 mg twice a day. The patient's blood pressure is mildly elevated. He will continue on Rivaroxaban . I will check a CBC to evaluate for anemia, and also increase his Lasix to 40 mg daily for 2 days as he appears to have some mild fluid retention. His breathing status is a little worse than it was last time I saw him and therefore a chest x-ray will be completed to evaluate for CHF. We will see him back in the office in one month unless he becomes symptomatic.

## 2012-08-03 NOTE — Patient Instructions (Signed)
Increase Lasix to 40mg  everyday for 2 days the go back to 20mg  daily.  Your physician recommends that you schedule a follow-up appointment in: 1 months with Dr. Dietrich Pates.

## 2012-08-03 NOTE — Progress Notes (Addendum)
HPI: Brendan Hall is an 78 year old patient of Dr. Dietrich Pates we are seeing for ongoing assessment treatment of chronic atrial flutter. On last visit the patient was found to be in atrial flutter with a heart rate of 144 beats per minute.  He was treated with intravenous diltiazem followed by adjustment of his usual oral medications.   He is having increased shortness of breath coughing congestion. Also some some mild abdominal distention. His weight essentially is unchanged. Blood pressure is well-controlled. He has a frequent nonproductive cough.  No Known Allergies  Current Outpatient Prescriptions  Medication Sig Dispense Refill  . albuterol (VENTOLIN HFA) 108 (90 BASE) MCG/ACT inhaler Inhale 2 puffs into the lungs every 6 (six) hours as needed.      . cyanocobalamin 1000 MCG tablet Take 1,000 mcg by mouth daily.      Marland Kitchen diltiazem (CARDIZEM CD) 180 MG 24 hr capsule Take 1 capsule (180 mg total) by mouth 2 (two) times daily. New medication for your heart rate and your blood pressure.  60 capsule  3  . Fluticasone-Salmeterol (ADVAIR) 250-50 MCG/DOSE AEPB Inhale 1 puff into the lungs every 12 (twelve) hours.        . furosemide (LASIX) 40 MG tablet Take 20 mg by mouth daily.      Marland Kitchen ipratropium-albuterol (DUONEB) 0.5-2.5 (3) MG/3ML SOLN Take 3 mLs by nebulization 4 (four) times daily.      . iron polysaccharides (NIFEREX) 150 MG capsule Take 150 mg by mouth daily. Iron supplementation for your anemia.      . metFORMIN (GLUCOPHAGE) 1000 MG tablet Take 1,000 mg by mouth 2 (two) times daily.      . mometasone (NASONEX) 50 MCG/ACT nasal spray Place 2 sprays into the nose daily.      Marland Kitchen omeprazole (PRILOSEC) 20 MG capsule Take 20 mg by mouth daily.      . Rivaroxaban (XARELTO) 15 MG TABS tablet Take 15 mg by mouth daily.      . silodosin (RAPAFLO) 8 MG CAPS capsule Take 8 mg by mouth daily with breakfast.      . simvastatin (ZOCOR) 40 MG tablet Take 40 mg by mouth daily.        No current  facility-administered medications for this visit.   Facility-Administered Medications Ordered in Other Visits  Medication Dose Route Frequency Provider Last Rate Last Dose  . diltiazem (CARDIZEM) injection 20 mg  20 mg Intravenous PRN Kathlen Brunswick, MD       Past Medical History  Diagnosis Date  . Myocardial infarction     30 years ago  . Shortness of breath   . Hyperlipidemia   . Hypertension   . Diabetes mellitus   . Arthritis   . COPD (chronic obstructive pulmonary disease)   . On home oxygen therapy     at night only  . Anemia 11/14/2011  . Colon polyps 08/2011    Per colonoscopy, Dr. Karilyn Cota.  Marland Kitchen BPH (benign prostatic hyperplasia)   . Urinary retention 11/15/2011  . Renal mass, right 11/16/2011  . Lung cancer   . Iron deficiency anemia 11/14/2011  . Atrial flutter with rapid ventricular response 04/29/2012   Past Surgical History  Procedure Date  . Cyst removed from face   . Colonoscopy 08/28/2011    Procedure: COLONOSCOPY;  Surgeon: Malissa Hippo, MD;  Location: [facility];  Service: Endoscopy;  Laterality: N/A;  . Doppler echocardiography 04/2012    Ejection fraction 55%.   ZOX:WRUEAV of systems  complete and found to be negative unless listed above  PHYSICAL EXAM BP 149/61  Pulse 122  Ht 5\' 7"  (1.702 m)  Wt 169 lb (76.658 kg)  BMI 26.47 kg/m2  General: Well developed, well nourished, in no acute distress Head: Eyes PERRLA, No xanthomas.   Normal cephalic and atramatic  Lungs: Clear bilaterally to auscultation and percussion. Heart: HRRR S1 S2, distant without MRG.  Pulses are 2+ & equal.            No carotid bruit. No JVD.  No abdominal bruits. No femoral bruits. Abdomen: Bowel sounds are positive, abdomen soft and non-tender without masses or  Hernia's noted. Slightly distended. Msk:  Back normal, normal gait. Normal strength and tone for age. Extremities: No clubbing, cyanosis bilateral ankle edema.  DP +1 Neuro: Alert and oriented X 3. Psych:  Good  affect, responds appropriately  EKG: Atrial flutter with variable AV block 3-1 and 2-1 as noted. Rate of 80 beats per minute.  ASSESSMENT AND PLAN

## 2012-08-04 ENCOUNTER — Other Ambulatory Visit: Payer: Self-pay | Admitting: *Deleted

## 2012-08-04 DIAGNOSIS — R0602 Shortness of breath: Secondary | ICD-10-CM

## 2012-08-04 LAB — BASIC METABOLIC PANEL
CO2: 29 mEq/L (ref 19–32)
Chloride: 105 mEq/L (ref 96–112)
Creat: 1.54 mg/dL — ABNORMAL HIGH (ref 0.50–1.35)
Potassium: 4.9 mEq/L (ref 3.5–5.3)
Sodium: 143 mEq/L (ref 135–145)

## 2012-08-10 ENCOUNTER — Ambulatory Visit: Payer: Medicare Other | Admitting: Adult Health

## 2012-08-12 NOTE — Progress Notes (Signed)
Patient ID: Brendan Hall, male   DOB: [birth year], 78 y.o.   MRN: [id number]  EKG:  Atrial fibrillation, controlled ventricular response with a heart rate of 80 bpm, delayed R wave progression  Patient is doing well with current medical regime. No modification is necessary.

## 2012-08-28 ENCOUNTER — Observation Stay (HOSPITAL_COMMUNITY): Payer: Medicare Other

## 2012-08-28 ENCOUNTER — Observation Stay (HOSPITAL_COMMUNITY)
Admission: EM | Admit: 2012-08-28 | Discharge: 2012-08-29 | Disposition: A | Discharge status: Home / Self Care | Payer: Medicare Other | Attending: Internal Medicine | Admitting: Internal Medicine

## 2012-08-28 ENCOUNTER — Encounter (HOSPITAL_COMMUNITY): Payer: Self-pay | Admitting: *Deleted

## 2012-08-28 ENCOUNTER — Emergency Department (HOSPITAL_COMMUNITY): Payer: Medicare Other

## 2012-08-28 DIAGNOSIS — N289 Disorder of kidney and ureter, unspecified: Secondary | ICD-10-CM

## 2012-08-28 DIAGNOSIS — D509 Iron deficiency anemia, unspecified: Secondary | ICD-10-CM

## 2012-08-28 DIAGNOSIS — E785 Hyperlipidemia, unspecified: Secondary | ICD-10-CM | POA: Insufficient documentation

## 2012-08-28 DIAGNOSIS — J209 Acute bronchitis, unspecified: Secondary | ICD-10-CM

## 2012-08-28 DIAGNOSIS — I509 Heart failure, unspecified: Secondary | ICD-10-CM | POA: Insufficient documentation

## 2012-08-28 DIAGNOSIS — N281 Cyst of kidney, acquired: Secondary | ICD-10-CM

## 2012-08-28 DIAGNOSIS — J441 Chronic obstructive pulmonary disease with (acute) exacerbation: Principal | ICD-10-CM

## 2012-08-28 DIAGNOSIS — R918 Other nonspecific abnormal finding of lung field: Secondary | ICD-10-CM | POA: Diagnosis present

## 2012-08-28 DIAGNOSIS — I4892 Unspecified atrial flutter: Secondary | ICD-10-CM

## 2012-08-28 DIAGNOSIS — I10 Essential (primary) hypertension: Secondary | ICD-10-CM | POA: Insufficient documentation

## 2012-08-28 DIAGNOSIS — J9600 Acute respiratory failure, unspecified whether with hypoxia or hypercapnia: Secondary | ICD-10-CM

## 2012-08-28 DIAGNOSIS — Z85118 Personal history of other malignant neoplasm of bronchus and lung: Secondary | ICD-10-CM | POA: Insufficient documentation

## 2012-08-28 DIAGNOSIS — E7800 Pure hypercholesterolemia, unspecified: Secondary | ICD-10-CM

## 2012-08-28 DIAGNOSIS — N183 Chronic kidney disease, stage 3 unspecified: Secondary | ICD-10-CM | POA: Diagnosis present

## 2012-08-28 DIAGNOSIS — E119 Type 2 diabetes mellitus without complications: Secondary | ICD-10-CM

## 2012-08-28 DIAGNOSIS — R0602 Shortness of breath: Secondary | ICD-10-CM | POA: Insufficient documentation

## 2012-08-28 DIAGNOSIS — N179 Acute kidney failure, unspecified: Secondary | ICD-10-CM

## 2012-08-28 DIAGNOSIS — R339 Retention of urine, unspecified: Secondary | ICD-10-CM

## 2012-08-28 LAB — TROPONIN I: Troponin I: <0.3 ng/mL (ref <0.30)

## 2012-08-28 LAB — COMPREHENSIVE METABOLIC PANEL
Alkaline Phosphatase: 67 U/L (ref 39–117)
BUN: 27 mg/dL — ABNORMAL HIGH (ref 6–23)
CO2: 26 mEq/L (ref 19–32)
Chloride: 103 mEq/L (ref 96–112)
GFR calc Af Amer: 37 mL/min — ABNORMAL LOW (ref >90)
GFR calc non Af Amer: 32 mL/min — ABNORMAL LOW (ref >90)
Glucose, Bld: 146 mg/dL — ABNORMAL HIGH (ref 70–99)
Potassium: 5.2 mEq/L — ABNORMAL HIGH (ref 3.5–5.1)
Total Bilirubin: 0.3 mg/dL (ref 0.3–1.2)
Total Protein: 7.6 g/dL (ref 6.0–8.3)

## 2012-08-28 LAB — CBC WITH DIFFERENTIAL/PLATELET
Eosinophils Absolute: 0.1 10*3/uL (ref 0.0–0.7)
Hemoglobin: 8.8 g/dL — ABNORMAL LOW (ref 13.0–17.0)
Lymphs Abs: 1.5 10*3/uL (ref 0.7–4.0)
MCH: 23.5 pg — ABNORMAL LOW (ref 26.0–34.0)
Monocytes Relative: 9 % (ref 3–12)
Neutrophils Relative %: 64 % (ref 43–77)
RBC: 3.74 MIL/uL — ABNORMAL LOW (ref 4.22–5.81)

## 2012-08-28 MED ORDER — DILTIAZEM HCL ER COATED BEADS 180 MG PO CP24
180.0000 mg | ORAL_CAPSULE | Freq: Two times a day (BID) | ORAL | Status: DC
  Administered 2012-08-29: 180 mg via ORAL
  Filled 2012-08-28 (×5): qty 1

## 2012-08-28 MED ORDER — IPRATROPIUM BROMIDE 0.02 % IN SOLN
0.5000 mg | Freq: Once | RESPIRATORY_TRACT | Status: AC
  Administered 2012-08-28: 0.5 mg via RESPIRATORY_TRACT
  Filled 2012-08-28: qty 2.5

## 2012-08-28 MED ORDER — METHYLPREDNISOLONE SODIUM SUCC 125 MG IJ SOLR
125.0000 mg | Freq: Once | INTRAMUSCULAR | Status: AC
  Administered 2012-08-28: 125 mg via INTRAVENOUS
  Filled 2012-08-28: qty 2

## 2012-08-28 MED ORDER — DILTIAZEM HCL 25 MG/5ML IV SOLN
5.0000 mg | Freq: Once | INTRAVENOUS | Status: AC
  Administered 2012-08-29: 5 mg via INTRAVENOUS
  Filled 2012-08-28: qty 5

## 2012-08-28 MED ORDER — MOXIFLOXACIN HCL IN NACL 400 MG/250ML IV SOLN
400.0000 mg | INTRAVENOUS | Status: DC
  Administered 2012-08-29: 400 mg via INTRAVENOUS
  Filled 2012-08-28 (×2): qty 250

## 2012-08-28 MED ORDER — ALBUTEROL SULFATE (5 MG/ML) 0.5% IN NEBU
5.0000 mg | INHALATION_SOLUTION | Freq: Once | RESPIRATORY_TRACT | Status: AC
  Administered 2012-08-28: 5 mg via RESPIRATORY_TRACT
  Filled 2012-08-28: qty 1

## 2012-08-28 MED ORDER — FUROSEMIDE 10 MG/ML IJ SOLN
40.0000 mg | Freq: Once | INTRAMUSCULAR | Status: AC
  Administered 2012-08-28: 40 mg via INTRAVENOUS
  Filled 2012-08-28: qty 4

## 2012-08-28 NOTE — ED Provider Notes (Signed)
History     CSN: [id number]  Arrival date & time 08/28/12  4098   First MD Initiated Contact with Patient 08/28/12 1854      Chief Complaint  Patient presents with  . Shortness of Breath    (Consider location/radiation/quality/duration/timing/severity/associated sxs/prior treatment) HPI Comments: Patient with history of copd, chf.  Presents complaining of a one week history of progressive shortness of breath, doe.  He denies chest pain, fever, or cough.  He believes his legs are more swollen.  He denies having missed any doses of his medications.    Patient is a 78 y.o. male presenting with shortness of breath. The history is provided by the patient.  Shortness of Breath  Episode onset: one week ago. The onset was gradual. The problem occurs continuously. The problem has been gradually worsening. The problem is moderate. The symptoms are relieved by rest. The symptoms are aggravated by activity. Associated symptoms include shortness of breath. Pertinent negatives include no chest pain, no fever and no cough.    Past Medical History  Diagnosis Date  . Myocardial infarction     30 years ago  . Shortness of breath   . Hyperlipidemia   . Hypertension   . Diabetes mellitus   . Arthritis   . COPD (chronic obstructive pulmonary disease)   . On home oxygen therapy     at night only  . Anemia 11/14/2011  . Colon polyps 08/2011    Per colonoscopy, Dr. Karilyn Cota.  Marland Kitchen BPH (benign prostatic hyperplasia)   . Urinary retention 11/15/2011  . Renal mass, right 11/16/2011  . Iron deficiency anemia 11/14/2011  . Atrial flutter with rapid ventricular response 04/29/2012  . Lung cancer     Past Surgical History  Procedure Date  . Cyst removed from face   . Colonoscopy 08/28/2011    Procedure: COLONOSCOPY;  Surgeon: Malissa Hippo, MD;  Location: [facility];  Service: Endoscopy;  Laterality: N/A;  . Doppler echocardiography 04/2012    Ejection fraction 55%.    Family History  Problem  Relation Age of Onset  . Cancer Mother   . Cancer Sister   . Cancer Brother     History  Substance Use Topics  . Smoking status: Former Smoker -- 1.5 packs/day for 60 years    Types: Cigarettes    Quit date: 08/06/2008  . Smokeless tobacco: Never Used  . Alcohol Use: No     occasional       Review of Systems  Constitutional: Negative for fever.  Respiratory: Positive for shortness of breath. Negative for cough.   Cardiovascular: Negative for chest pain.  All other systems reviewed and are negative.    Allergies  Review of patient's allergies indicates no known allergies.  Home Medications   Current Outpatient Rx  Name Route Sig Dispense Refill  . ALBUTEROL SULFATE HFA 108 (90 BASE) MCG/ACT IN AERS Inhalation Inhale 2 puffs into the lungs every 6 (six) hours as needed.    . CYANOCOBALAMIN 1000 MCG PO TABS Oral Take 1,000 mcg by mouth daily.    Marland Kitchen DILTIAZEM HCL ER COATED BEADS 180 MG PO CP24 Oral Take 1 capsule (180 mg total) by mouth 2 (two) times daily. New medication for your heart rate and your blood pressure. 60 capsule 3  . FLUTICASONE-SALMETEROL 250-50 MCG/DOSE IN AEPB Inhalation Inhale 1 puff into the lungs every 12 (twelve) hours.      . FUROSEMIDE 40 MG PO TABS Oral Take 20 mg by mouth daily.    Marland Kitchen  IPRATROPIUM-ALBUTEROL 0.5-2.5 (3) MG/3ML IN SOLN Nebulization Take 3 mLs by nebulization 4 (four) times daily.    Marland Kitchen POLYSACCHARIDE IRON COMPLEX 150 MG PO CAPS Oral Take 150 mg by mouth daily. Iron supplementation for your anemia.    Marland Kitchen METFORMIN HCL 1000 MG PO TABS Oral Take 1,000 mg by mouth 2 (two) times daily.    . MOMETASONE FUROATE 50 MCG/ACT NA SUSP Nasal Place 2 sprays into the nose daily.    Marland Kitchen OMEPRAZOLE 20 MG PO CPDR Oral Take 20 mg by mouth daily.    Marland Kitchen RIVAROXABAN 15 MG PO TABS Oral Take 15 mg by mouth daily.    Marland Kitchen SILODOSIN 8 MG PO CAPS Oral Take 8 mg by mouth daily with breakfast.    . SIMVASTATIN 40 MG PO TABS Oral Take 40 mg by mouth daily.       BP 125/68   Pulse 65  Temp 98.5 F (36.9 C) (Oral)  Resp 22  Ht 5\' 7"  (1.702 m)  Wt 169 lb (76.658 kg)  BMI 26.47 kg/m2  SpO2 96%  Physical Exam  Nursing note and vitals reviewed. Constitutional: He is oriented to person, place, and time. He appears well-developed and well-nourished. No distress.  HENT:  Head: Normocephalic and atraumatic.  Mouth/Throat: Oropharynx is clear and moist.  Neck: Normal range of motion. Neck supple.  Cardiovascular: Normal rate and regular rhythm.   No murmur heard. Pulmonary/Chest: Effort normal.       There are slight rales in the bases bilateral with bilateral scattered rhonchi.  Abdominal: Soft. Bowel sounds are normal. He exhibits no distension. There is no tenderness.  Musculoskeletal: Normal range of motion.       1-2+ pitting edema in the ble.  Neurological: He is alert and oriented to person, place, and time.  Skin: Skin is warm and dry. He is not diaphoretic.    ED Course  Procedures (including critical care time)   Labs Reviewed  CBC WITH DIFFERENTIAL  COMPREHENSIVE METABOLIC PANEL  TROPONIN I  PRO B NATRIURETIC PEPTIDE   No results found.   No diagnosis found.   Date: 08/28/2012  Rate: 70  Rhythm: atrial flutter  QRS Axis: normal  Intervals: normal  ST/T Wave abnormalities: normal  Conduction Disutrbances:none  Narrative Interpretation:   Old EKG Reviewed: unchanged    MDM  The patient has a history of both chf and copd.  He presents here with worsening breathing, doe for the past week.  The cause of these symptoms appears to be related to both.  The bnp is elevated and there are rhonchorous breath sounds.  He was given lasix, steroids, and nebulizer treatments.  He becomes dizzy when standing and desaturates to the mid 80's.  He does not feel as though he is going to do well at home.  I have consulted Dr. Orvan Falconer for admission.          Geoffery Lyons, MD 08/28/12 2248

## 2012-08-28 NOTE — ED Notes (Signed)
Patient and family do not need anything at this time. 

## 2012-08-28 NOTE — ED Notes (Signed)
Sob,no chest pain, cough white sputum.  No NVD.

## 2012-08-28 NOTE — ED Notes (Signed)
Family at bedside. Patient was given peanut butter crackers and Cranberry Juice per MD

## 2012-08-28 NOTE — ED Notes (Signed)
Dr. Campbell at bedside to assess pt.

## 2012-08-28 NOTE — ED Notes (Signed)
Patient states he is on 2 L of oxygen at home. RN made aware and put patient on oxygen. Patient would like something to eat. RN made aware.

## 2012-08-28 NOTE — ED Notes (Signed)
Patient states he does not need anything at this time. 

## 2012-08-29 DIAGNOSIS — R918 Other nonspecific abnormal finding of lung field: Secondary | ICD-10-CM | POA: Diagnosis present

## 2012-08-29 DIAGNOSIS — N183 Chronic kidney disease, stage 3 unspecified: Secondary | ICD-10-CM

## 2012-08-29 DIAGNOSIS — Z85118 Personal history of other malignant neoplasm of bronchus and lung: Secondary | ICD-10-CM

## 2012-08-29 DIAGNOSIS — J441 Chronic obstructive pulmonary disease with (acute) exacerbation: Secondary | ICD-10-CM | POA: Diagnosis present

## 2012-08-29 LAB — COMPREHENSIVE METABOLIC PANEL
Alkaline Phosphatase: 67 U/L (ref 39–117)
BUN: 25 mg/dL — ABNORMAL HIGH (ref 6–23)
CO2: 25 mEq/L (ref 19–32)
Chloride: 99 mEq/L (ref 96–112)
Creatinine, Ser: 1.63 mg/dL — ABNORMAL HIGH (ref 0.50–1.35)
GFR calc non Af Amer: 38 mL/min — ABNORMAL LOW (ref >90)
Glucose, Bld: 266 mg/dL — ABNORMAL HIGH (ref 70–99)
Potassium: 5 mEq/L (ref 3.5–5.1)
Total Bilirubin: 0.4 mg/dL (ref 0.3–1.2)

## 2012-08-29 LAB — CBC
HCT: 31.9 % — ABNORMAL LOW (ref 39.0–52.0)
Hemoglobin: 9.2 g/dL — ABNORMAL LOW (ref 13.0–17.0)
MCV: 79.9 fL (ref 78.0–100.0)
RBC: 3.99 MIL/uL — ABNORMAL LOW (ref 4.22–5.81)
WBC: 4.8 10*3/uL (ref 4.0–10.5)

## 2012-08-29 LAB — HEMOGLOBIN A1C
Hgb A1c MFr Bld: 7 % — ABNORMAL HIGH (ref <5.7)
Mean Plasma Glucose: 154 mg/dL — ABNORMAL HIGH (ref <117)

## 2012-08-29 MED ORDER — GUAIFENESIN ER 600 MG PO TB12
1200.0000 mg | ORAL_TABLET | Freq: Two times a day (BID) | ORAL | Status: DC
  Administered 2012-08-29: 1200 mg via ORAL
  Filled 2012-08-29: qty 2

## 2012-08-29 MED ORDER — INSULIN ASPART 100 UNIT/ML ~~LOC~~ SOLN
0.0000 [IU] | Freq: Three times a day (TID) | SUBCUTANEOUS | Status: DC
  Administered 2012-08-29: 3 [IU] via SUBCUTANEOUS

## 2012-08-29 MED ORDER — MOXIFLOXACIN HCL IN NACL 400 MG/250ML IV SOLN
INTRAVENOUS | Status: AC
  Filled 2012-08-29: qty 250

## 2012-08-29 MED ORDER — INSULIN ASPART 100 UNIT/ML ~~LOC~~ SOLN
0.0000 [IU] | Freq: Every day | SUBCUTANEOUS | Status: DC
  Administered 2012-08-29: 4 [IU] via SUBCUTANEOUS

## 2012-08-29 MED ORDER — LEVOFLOXACIN 500 MG PO TABS: 500.0000 mg | ORAL_TABLET | Freq: Every day | ORAL | Status: AC

## 2012-08-29 MED ORDER — IPRATROPIUM BROMIDE 0.02 % IN SOLN
0.5000 mg | RESPIRATORY_TRACT | Status: DC
  Administered 2012-08-29 (×2): 0.5 mg via RESPIRATORY_TRACT
  Filled 2012-08-29 (×2): qty 2.5

## 2012-08-29 MED ORDER — RIVAROXABAN 10 MG PO TABS: 15.0000 mg | ORAL_TABLET | Freq: Every day | ORAL | Status: DC

## 2012-08-29 MED ORDER — ACETAMINOPHEN 325 MG PO TABS: 650.0000 mg | ORAL_TABLET | ORAL | Status: DC | PRN

## 2012-08-29 MED ORDER — ALBUTEROL SULFATE (5 MG/ML) 0.5% IN NEBU: 2.5000 mg | INHALATION_SOLUTION | RESPIRATORY_TRACT | Status: DC | PRN

## 2012-08-29 MED ORDER — HYDROMORPHONE HCL PF 1 MG/ML IJ SOLN: 0.5000 mg | INTRAMUSCULAR | Status: DC | PRN

## 2012-08-29 MED ORDER — METFORMIN HCL 500 MG PO TABS
1000.0000 mg | ORAL_TABLET | Freq: Two times a day (BID) | ORAL | Status: DC
  Administered 2012-08-29: 1000 mg via ORAL
  Filled 2012-08-29: qty 2

## 2012-08-29 MED ORDER — SIMVASTATIN 20 MG PO TABS: 40.0000 mg | ORAL_TABLET | Freq: Every day | ORAL | Status: DC

## 2012-08-29 MED ORDER — ALBUTEROL SULFATE (5 MG/ML) 0.5% IN NEBU
2.5000 mg | INHALATION_SOLUTION | RESPIRATORY_TRACT | Status: DC
  Administered 2012-08-29 (×2): 2.5 mg via RESPIRATORY_TRACT
  Filled 2012-08-29 (×2): qty 0.5

## 2012-08-29 MED ORDER — FLEET ENEMA 7-19 GM/118ML RE ENEM: 1.0000 | ENEMA | Freq: Once | RECTAL | Status: DC | PRN

## 2012-08-29 MED ORDER — METHYLPREDNISOLONE SODIUM SUCC 125 MG IJ SOLR
125.0000 mg | Freq: Four times a day (QID) | INTRAMUSCULAR | Status: DC
  Administered 2012-08-29 (×2): 125 mg via INTRAVENOUS
  Filled 2012-08-29 (×2): qty 2

## 2012-08-29 MED ORDER — ONDANSETRON HCL 4 MG PO TABS: 4.0000 mg | ORAL_TABLET | Freq: Four times a day (QID) | ORAL | Status: DC | PRN

## 2012-08-29 MED ORDER — ONDANSETRON HCL 4 MG/2ML IJ SOLN: 4.0000 mg | INTRAMUSCULAR | Status: DC | PRN

## 2012-08-29 MED ORDER — PANTOPRAZOLE SODIUM 40 MG PO TBEC: 40.0000 mg | DELAYED_RELEASE_TABLET | Freq: Every day | ORAL | Status: DC

## 2012-08-29 MED ORDER — TAMSULOSIN HCL 0.4 MG PO CAPS: 0.4000 mg | ORAL_CAPSULE | Freq: Every day | ORAL | Status: DC

## 2012-08-29 MED ORDER — BISACODYL 5 MG PO TBEC: 5.0000 mg | DELAYED_RELEASE_TABLET | Freq: Every day | ORAL | Status: DC | PRN

## 2012-08-29 MED ORDER — TRAZODONE HCL 50 MG PO TABS: 25.0000 mg | ORAL_TABLET | Freq: Every evening | ORAL | Status: DC | PRN

## 2012-08-29 MED ORDER — SODIUM CHLORIDE 0.9 % IJ SOLN: 3.0000 mL | Freq: Two times a day (BID) | INTRAMUSCULAR | Status: DC

## 2012-08-29 MED ORDER — FLUTICASONE PROPIONATE 50 MCG/ACT NA SUSP
1.0000 | Freq: Every day | NASAL | Status: DC
  Filled 2012-08-29: qty 16

## 2012-08-29 MED ORDER — FLUTICASONE-SALMETEROL 250-50 MCG/DOSE IN AEPB
1.0000 | INHALATION_SPRAY | Freq: Two times a day (BID) | RESPIRATORY_TRACT | Status: DC
  Administered 2012-08-29: 1 via RESPIRATORY_TRACT
  Filled 2012-08-29: qty 14

## 2012-08-29 MED ORDER — ACETAMINOPHEN 650 MG RE SUPP: 650.0000 mg | Freq: Four times a day (QID) | RECTAL | Status: DC | PRN

## 2012-08-29 MED ORDER — POLYETHYLENE GLYCOL 3350 17 G PO PACK: 17.0000 g | PACK | Freq: Every day | ORAL | Status: DC | PRN

## 2012-08-29 MED ORDER — PREDNISONE 20 MG PO TABS: ORAL_TABLET | ORAL | Status: DC

## 2012-08-29 NOTE — Progress Notes (Signed)
Patient received discharge instructions along with follow up appointments and prescriptions. Patient verbalized understanding of all instructions. Patient was escorted by staff via wheelchair to vehicle. Patient discharged to home in stable condition. 

## 2012-08-29 NOTE — H&P (Addendum)
[facility] History and Physical  Brendan Hall  WGN:[id number]  DOB: Apr 13, 1930   DOA: 08/28/2012  patient seen at 11:30 PM   PCP:   Colon Branch, MD Pulmonologist: Dr. Kari Baars M.D.  Chief Complaint:  Progressive shortness of breath for the past week  HPI: Brendan Hall is an 78 y.o. male.   Known history of COPD, CKD,status post treatment of remote lung cancer, atrial flutter managed with Cardizem and Xarelto, progress with progressive shortness of breath not helped by home nebulizer. Worse when lying down improved with sitting up, associated with a persistent nonproductive cough. Denies chest pains palpitation.   In the emergency room he is on minimal improvement with nebs he's also had a dose of Lasix, and when he got up to walk his oxygen saturation dropped down into the low 80s even while on his baseline oxygen.  He reports he is been having swelling of his ankles since being started on Cardizem and Xarelto.  Denies fever or chills  He has recently been having some abnormal chest x-rays and Dr. Juanetta Gosling as scheduled to have a CT scan of his chest to assess for recurrent cancer versus old scars.    Rewiew of Systems:   All systems negative except as marked bold or noted in the HPI;  Constitutional: Negative for malaise, fever and chills. ;  Eyes: Negative for eye pain, redness and discharge. ;  ENMT: Negative for ear pain, hoarseness, nasal congestion, sinus pressure and sore throat. ;  Cardiovascular: Negative for chest pain, palpitations, diaphoresis; Respiratory: Negative for hemoptysis,  and stridor. ;  Gastrointestinal: Negative for nausea, vomiting, diarrhea, constipation, abdominal pain, melena, blood in stool, hematemesis, jaundice and rectal bleeding. unusual weight loss..   Genitourinary: Negative for dysuria, incontinence,flank pain and hematuria; Musculoskeletal: Negative for back pain and neck pain. Negative for swelling and trauma.;  Skin:  . Negative for pruritus, rash, abrasions, bruising and skin lesion.; ulcerations Neuro: Negative for headache, l and neck stiffness. Negative for weakness, altered level of consciousness , altered mental status, extremity weakness, burning feet, involuntary movement, seizure and syncope.  Psych: negative for anxiety, depression, insomnia, tearfulness, panic attacks, hallucinations, paranoia, suicidal or homicidal ideation    Past Medical History  Diagnosis Date  . Myocardial infarction     30 years ago  . Shortness of breath   . Hyperlipidemia   . Hypertension   . Diabetes mellitus   . Arthritis   . COPD (chronic obstructive pulmonary disease)   . On home oxygen therapy     at night only  . Anemia 11/14/2011  . Colon polyps 08/2011    Per colonoscopy, Dr. Karilyn Cota.  Marland Kitchen BPH (benign prostatic hyperplasia)   . Urinary retention 11/15/2011  . Renal mass, right 11/16/2011  . Iron deficiency anemia 11/14/2011  . Atrial flutter with rapid ventricular response 04/29/2012  . Lung cancer     Past Surgical History  Procedure Date  . Cyst removed from face   . Colonoscopy 08/28/2011    Procedure: COLONOSCOPY;  Surgeon: Malissa Hippo, MD;  Location: [facility];  Service: Endoscopy;  Laterality: N/A;  . Doppler echocardiography 04/2012    Ejection fraction 55%.    Medications:  HOME MEDS: Prior to Admission medications   Medication Sig Start Date End Date Taking? Authorizing Provider  albuterol (VENTOLIN HFA) 108 (90 BASE) MCG/ACT inhaler Inhale 2 puffs into the lungs every 6 (six) hours as needed. Shortness of breath   Yes Historical Provider, MD  cyanocobalamin 1000 MCG tablet Take 1,000 mcg by mouth daily. 05/03/12 05/03/13 Yes Elliot Cousin, MD  diltiazem (CARDIZEM CD) 180 MG 24 hr capsule Take 1 capsule (180 mg total) by mouth 2 (two) times daily. New medication for your heart rate and your blood pressure. 07/13/12 07/13/13 Yes Jodelle Gross, NP  Fluticasone-Salmeterol (ADVAIR)  250-50 MCG/DOSE AEPB Inhale 1 puff into the lungs every 12 (twelve) hours.     Yes Historical Provider, MD  furosemide (LASIX) 40 MG tablet Take 20 mg by mouth daily.   Yes Historical Provider, MD  ipratropium-albuterol (DUONEB) 0.5-2.5 (3) MG/3ML SOLN Take 3 mLs by nebulization 4 (four) times daily. 05/03/12  Yes Elliot Cousin, MD  iron polysaccharides (NIFEREX) 150 MG capsule Take 150 mg by mouth daily. Iron supplementation for your anemia. 05/03/12 05/03/13 Yes Elliot Cousin, MD  metFORMIN (GLUCOPHAGE) 1000 MG tablet Take 1,000 mg by mouth 2 (two) times daily.   Yes Historical Provider, MD  mometasone (NASONEX) 50 MCG/ACT nasal spray Place 2 sprays into the nose daily.   Yes Historical Provider, MD  Rivaroxaban (XARELTO) 15 MG TABS tablet Take 15 mg by mouth daily. 05/03/12  Yes Elliot Cousin, MD  silodosin (RAPAFLO) 8 MG CAPS capsule Take 8 mg by mouth daily with breakfast.   Yes Historical Provider, MD  simvastatin (ZOCOR) 40 MG tablet Take 40 mg by mouth daily.    Yes Historical Provider, MD     Allergies:  No Known Allergies  Social History:   reports that he quit smoking about 4 years ago. His smoking use included Cigarettes. He has a 90 pack-year smoking history. He has never used smokeless tobacco. He reports that he does not drink alcohol or use illicit drugs.  Family History: Family History  Problem Relation Age of Onset  . Cancer Mother   . Cancer Sister   . Cancer Brother      Physical Exam: Filed Vitals:   08/28/12 2157 08/28/12 2200 08/28/12 2300 08/29/12 0012  BP: 125/68 150/64 148/65 164/90  Pulse: 92 65 48 122  Temp:    98.4 F (36.9 C)  TempSrc:    Oral  Resp: 20 27 30 24   Height:      Weight:      SpO2: 93% 96% 94% 98%   Blood pressure 164/90, pulse 122, temperature 98.4 F (36.9 C), temperature source Oral, resp. rate 24, height 5\' 7"  (1.702 m), weight 76.658 kg (169 lb), SpO2 98.00%.  GEN:  Pleasant but very short of breath elderly African American gentleman  sitting up in the stretcher; cooperative with exam; denies thirst PSYCH:  alert and oriented x4; does appear a little anxious; affect is appropriate. HEENT: Mucous membranes pink and anicteric; PERRLA; EOM intact; no thyromegaly or carotid bruit;  Breasts:: Not examined CHEST WALL: No tenderness CHEST: Tachypneic; prolonged expiration HEART: Irregularly irregular tachycardia; no murmurs rubs or gallops BACK: Mild kyphosis and scoliosis; no CVA tenderness ABDOMEN: Obese, soft non-tender; no masses, no organomegaly, normal abdominal bowel sounds;; no intertriginous candida. Rectal Exam: Not done EXTREMITIES: ; age-appropriate arthropathy of the hands and knees and; trace edema; no ulcerations. Genitalia: not examined PULSES: 2+ and symmetric SKIN: Normal hydration no rash or ulceration CNS: Cranial nerves 2-12 grossly intact no focal lateralizing neurologic deficit   Labs on Admission:  Basic Metabolic Panel:  Lab 08/28/12 1478  NA 138  K 5.2*  CL 103  CO2 26  GLUCOSE 146*  BUN 27*  CREATININE 1.87*  CALCIUM 9.9  MG --  PHOS --   Liver Function Tests:  Lab 08/28/12 1917  AST 15  ALT 8  ALKPHOS 67  BILITOT 0.3  PROT 7.6  ALBUMIN 3.7   No results found for this basename: LIPASE:5,AMYLASE:5 in the last 168 hours No results found for this basename: AMMONIA:5 in the last 168 hours CBC:  Lab 08/28/12 1917  WBC 5.9  NEUTROABS 3.8  HGB 8.8*  HCT 29.7*  MCV 79.4  PLT 342   Cardiac Enzymes:  Lab 08/28/12 1917  CKTOTAL --  CKMB --  CKMBINDEX --  TROPONINI <0.30   BNP: No components found with this basename: POCBNP:5 D-dimer: No components found with this basename: D-DIMER:5 CBG: No results found for this basename: GLUCAP:5 in the last 168 hours  Radiological Exams on Admission: Ct Chest Wo Contrast  08/29/2012  *RADIOLOGY REPORT*  Clinical Data: Shortness of breath, cough.  History of lung carcinoma post chemotherapy and radiation.  CT CHEST WITHOUT CONTRAST   Technique:  Multidetector CT imaging of the chest was performed following the standard protocol without IV contrast.  Comparison: 05/27/2012  Findings: Small right pleural effusion, new since prior study. Coronary and aortic  calcifications.  Post XRT changes at the right hilum and in the medial anterior right upper lobe and superior segment right lower lobe.  Dependent atelectasis posteriorly in the posterior basal segment left lower lobe.  Emphysematous changes in both upper lobes. No new hilar or mediastinal adenopathy is evident.  Spondylitic changes in the lower cervical and mid thoracic spine.  Degenerative changes in the left shoulder.  IMPRESSION:  1.  New small right pleural effusion. 2.  Stable post-treatment changes at the right hilum.   Original Report Authenticated By: Osa Craver, M.D.    Dg Chest Port 1 View  08/28/2012  *RADIOLOGY REPORT*  Clinical Data: Shortness of breath.  PORTABLE CHEST - 1 VIEW  Comparison: 08/03/2012.  Findings: The patient is currently rotated to the right with the mediastinal structures partially obscuring the previously described right suprahilar mass.  A previously demonstrated mass-like density at the left lung base is not currently visualized.  The cardiac silhouette is borderline enlarged.  There is some increased density at the right lung apex.  Marked left shoulder degenerative changes.  IMPRESSION:  1.  Interval possible postobstructive changes at the right lung apex. 2.  Poorly visualized previously seen right suprahilar mass due to patient rotation.   Original Report Authenticated By: Darrol Angel, M.D.     Assessment/Plan Present on Admission:  .COPD exacerbation .DM type 2 (diabetes mellitus, type 2) .Acute on chronic respiratory failure .Lung mass .CKD (chronic kidney disease), stage III   PLAN: We'll bring this gentleman on observation for serial nebulizations and steroids and will start antibiotics; Will get CT scan of the chest  without contrast to clarify the significance of his abnormal chest x-ray Will not give any further intravenous Lasix, since recent echo shows normal ejection and did not specifically record diastolic dysfunction. Will give one intravenous dose of Cardizem and give his night time dose of extended-release Cardizem, since skipping this dose may be the reason why he is having tachycardia at this time  Consult his pulmonologist in the morning   Code Status: FULL CODE  Family Communication: And daughter present at the interview and examination and plans discussed Disposition Plan: Likely home in the next 24 hours if improves and cleared by his pulmonologist.    Toya Palacios Nocturnist [facility] Pager [phone]   08/29/2012, 12:25  AM

## 2012-08-29 NOTE — Discharge Summary (Signed)
Physician Discharge Summary  DOLPH TAVANO RUE:[id number] DOB: 1930-03-09 DOA: 08/28/2012  PCP: Colon Branch, MD  Admit date: 08/28/2012 Discharge date: 08/29/2012  Recommendations for Outpatient Follow-up:  1. Followup followup with primary care physician and pulmonology, Dr. Juanetta Gosling.   Discharge Diagnoses:  1. Exacerbation of COPD, significantly improved. 2. History of lung mass, stable. 3. Chronic kidney disease stage III. 4. Type 2 diabetes mellitus.   Discharge Condition: Stable and improved.  Diet recommendation: Carbohydrate modified diet.  Filed Weights   08/28/12 1858 08/29/12 0100 08/29/12 0604  Weight: 76.658 kg (169 lb) 73.6 kg (162 lb 4.1 oz) 73.1 kg (161 lb 2.5 oz)    History of present illness:  This very pleasant 78 year old man was admitted overnight with symptoms of progressive dyspnea for the past week. Please see initial history as outlined below: HPI:  Brendan Hall is an 78 y.o. male. Known history of COPD, CKD,status post treatment of remote lung cancer, atrial flutter managed with Cardizem and Xarelto, progress with progressive shortness of breath not helped by home nebulizer. Worse when lying down improved with sitting up, associated with a persistent nonproductive cough. Denies chest pains palpitation.  In the emergency room he is on minimal improvement with nebs he's also had a dose of Lasix, and when he got up to walk his oxygen saturation dropped down into the low 80s even while on his baseline oxygen.  He reports he is been having swelling of his ankles since being started on Cardizem and Xarelto.  Denies fever or chills  He has recently been having some abnormal chest x-rays and Dr. Juanetta Gosling as scheduled to have a CT scan of his chest to assess for recurrent cancer versus old scars.  Hospital Course:  He was admitted overnight and feels significantly improved with the use of intravenous steroids and antibiotics. CT scan of the chest was done  which did not indicate any masses or metastatic disease that is new. He has a cough but minimally productive of sputum. He has no fever. He feels well enough to go home now.  Procedures:  None.  Consultations:  None.  Discharge Exam: Filed Vitals:   08/29/12 0157 08/29/12 0158 08/29/12 0308 08/29/12 0604  BP:    124/73  Pulse:  111  86  Temp:    97.8 F (36.6 C)  TempSrc:    Oral  Resp:  22  19  Height:      Weight:    73.1 kg (161 lb 2.5 oz)  SpO2: 98% 98% 97% 95%    General: He looks systemically well. There is no increased work of breathing. There is no peripheral central cyanosis. He is not septic clinically. Cardiovascular: Heart sounds are present and somewhat irregular consistent with atrial fibrillation/flutter which is rate controlled. Respiratory: Lung fields are entirely clear now with no evidence of wheezing, crackles or bronchial breathing. He is alert and orientated.  Discharge Instructions  Discharge Orders    Future Appointments: Provider: Department: Dept Phone: Center:   09/03/2012 1:45 PM Kathlen Brunswick, MD [facility] [phone] [id number]     Future Orders Please Complete By Expires   Diet - low sodium heart healthy      Increase activity slowly          Medication List     As of 08/29/2012  7:33 AM    TAKE these medications         cyanocobalamin 1000 MCG tablet   Take 1,000 mcg by mouth daily.  diltiazem 180 MG 24 hr capsule   Commonly known as: CARDIZEM CD   Take 1 capsule (180 mg total) by mouth 2 (two) times daily. New medication for your heart rate and your blood pressure.      Fluticasone-Salmeterol 250-50 MCG/DOSE Aepb   Commonly known as: ADVAIR   Inhale 1 puff into the lungs every 12 (twelve) hours.      furosemide 40 MG tablet   Commonly known as: LASIX   Take 20 mg by mouth daily.      ipratropium-albuterol 0.5-2.5 (3) MG/3ML Soln   Commonly known as: DUONEB   Take 3 mLs by nebulization 4 (four) times  daily.      iron polysaccharides 150 MG capsule   Commonly known as: NIFEREX   Take 150 mg by mouth daily. Iron supplementation for your anemia.      levofloxacin 500 MG tablet   Commonly known as: LEVAQUIN   Take 1 tablet (500 mg total) by mouth daily.      metFORMIN 1000 MG tablet   Commonly known as: GLUCOPHAGE   Take 1,000 mg by mouth 2 (two) times daily.      mometasone 50 MCG/ACT nasal spray   Commonly known as: NASONEX   Place 2 sprays into the nose daily.      predniSONE 20 MG tablet   Commonly known as: DELTASONE   Take 2 tablets daily for 3 days, then 1 tablet daily for 3 days, then half tablet daily for 3 days, then STOP.      Rivaroxaban 15 MG Tabs tablet   Commonly known as: XARELTO   Take 15 mg by mouth daily.      silodosin 8 MG Caps capsule   Commonly known as: RAPAFLO   Take 8 mg by mouth daily with breakfast.      simvastatin 40 MG tablet   Commonly known as: ZOCOR   Take 40 mg by mouth daily.      VENTOLIN HFA 108 (90 BASE) MCG/ACT inhaler   Generic drug: albuterol   Inhale 2 puffs into the lungs every 6 (six) hours as needed. Shortness of breath           Follow-up Information    Follow up with HAWKINS,EDWARD L, MD. Call in 1 week.   Contact information:   [address]  [place] [address] [phone]           The results of significant diagnostics from this hospitalization (including imaging, microbiology, ancillary and laboratory) are listed below for reference.    Significant Diagnostic Studies: Dg Chest 2 View  08/03/2012  *RADIOLOGY REPORT*  Clinical Data: Short of breath and lung cancer  CHEST - 2 VIEW  Comparison: 07/08/2012  Findings: Right suprahilar lung mass with cicatrization and volume loss in the right lung is not significantly changed.  Lungs remain hyperaerated.  No pneumothorax.  Upper normal heart size.  Kerley B lines at the left base have developed of unknown significance.  Ill- defined opacity has  developed at the left base adjacent to the cardiac apex.  This may simply represent a confluence of shadows.  IMPRESSION: Stable changes related to malignancy and volume loss in the right lung.  New interstitial lung disease at the left lung base.  New ill-defined opacity at the left base which is an indeterminate finding.  This may simply represent a confluence of shadows, airspace disease, or volume loss, however a new nodule cannot be excluded.  Short-term follow-up  chest radiographs are recommended to ensure resolution.   Original Report Authenticated By: Donavan Burnet, M.D.    Ct Chest Wo Contrast  08/29/2012  *RADIOLOGY REPORT*  Clinical Data: Shortness of breath, cough.  History of lung carcinoma post chemotherapy and radiation.  CT CHEST WITHOUT CONTRAST  Technique:  Multidetector CT imaging of the chest was performed following the standard protocol without IV contrast.  Comparison: 05/27/2012  Findings: Small right pleural effusion, new since prior study. Coronary and aortic  calcifications.  Post XRT changes at the right hilum and in the medial anterior right upper lobe and superior segment right lower lobe.  Dependent atelectasis posteriorly in the posterior basal segment left lower lobe.  Emphysematous changes in both upper lobes. No new hilar or mediastinal adenopathy is evident.  Spondylitic changes in the lower cervical and mid thoracic spine.  Degenerative changes in the left shoulder.  IMPRESSION:  1.  New small right pleural effusion. 2.  Stable post-treatment changes at the right hilum.   Original Report Authenticated By: Osa Craver, M.D.    Dg Chest Port 1 View  08/28/2012  *RADIOLOGY REPORT*  Clinical Data: Shortness of breath.  PORTABLE CHEST - 1 VIEW  Comparison: 08/03/2012.  Findings: The patient is currently rotated to the right with the mediastinal structures partially obscuring the previously described right suprahilar mass.  A previously demonstrated mass-like density at  the left lung base is not currently visualized.  The cardiac silhouette is borderline enlarged.  There is some increased density at the right lung apex.  Marked left shoulder degenerative changes.  IMPRESSION:  1.  Interval possible postobstructive changes at the right lung apex. 2.  Poorly visualized previously seen right suprahilar mass due to patient rotation.   Original Report Authenticated By: Darrol Angel, M.D.         Labs: Basic Metabolic Panel:  Lab 08/28/12 5621  NA 138  K 5.2*  CL 103  CO2 26  GLUCOSE 146*  BUN 27*  CREATININE 1.87*  CALCIUM 9.9  MG --  PHOS --   Liver Function Tests:  Lab 08/28/12 1917  AST 15  ALT 8  ALKPHOS 67  BILITOT 0.3  PROT 7.6  ALBUMIN 3.7     CBC:  Lab 08/28/12 1917  WBC 5.9  NEUTROABS 3.8  HGB 8.8*  HCT 29.7*  MCV 79.4  PLT 342   Cardiac Enzymes:  Lab 08/28/12 1917  CKTOTAL --  CKMB --  CKMBINDEX --  TROPONINI <0.30   BNP: BNP (last 3 results)  Basename 08/28/12 1917 04/30/12 0907  PROBNP 1091.0* 1787.0*   CBG:  Lab 08/29/12 0054  GLUCAP 378*    Time coordinating discharge: *Less than 30 minutes  Signed:  Wilson Singer  [facility] 08/29/2012, 7:33 AM

## 2012-09-01 ENCOUNTER — Other Ambulatory Visit (HOSPITAL_COMMUNITY): Payer: Self-pay | Admitting: Pulmonary Disease

## 2012-09-01 DIAGNOSIS — R918 Other nonspecific abnormal finding of lung field: Secondary | ICD-10-CM

## 2012-09-02 ENCOUNTER — Ambulatory Visit (HOSPITAL_COMMUNITY): Payer: Medicare Other

## 2012-09-03 ENCOUNTER — Encounter: Payer: Self-pay | Admitting: Cardiology

## 2012-09-03 ENCOUNTER — Ambulatory Visit: Payer: Medicare Other | Admitting: Cardiology

## 2012-09-22 ENCOUNTER — Encounter: Payer: Self-pay | Admitting: Cardiology

## 2012-09-22 ENCOUNTER — Ambulatory Visit (INDEPENDENT_AMBULATORY_CARE_PROVIDER_SITE_OTHER): Payer: Medicare Other | Admitting: Cardiology

## 2012-09-22 VITALS — BP 110/70 | HR 88 | Ht 67.5 in | Wt 162.8 lb

## 2012-09-22 DIAGNOSIS — I4892 Unspecified atrial flutter: Secondary | ICD-10-CM

## 2012-09-22 DIAGNOSIS — D509 Iron deficiency anemia, unspecified: Secondary | ICD-10-CM

## 2012-09-22 NOTE — Assessment & Plan Note (Signed)
Heart rate is adequately controlled today in the office, currently in the 80s on Cardizem CD 180 mg twice daily. He continues on Xarelto, samples were given. No changes made. Followup arranged.

## 2012-09-22 NOTE — Assessment & Plan Note (Signed)
Hemoglobin 9.2 by lab work in early October. Stool cards given.

## 2012-09-22 NOTE — Progress Notes (Signed)
Clinical Summary Brendan Hall is a 78 y.o.male presenting for followup. He is a patient of Dr. Dietrich Pates, inadvertently placed on my schedule today by Ms. Lawrence NP. He was last seen in September. I reviewed his recent history. Hospital admission noted earlier in October with COPD exacerbation.  Lab work in early October showed potassium 5.0, BUN 25, creatinine1.6, normal AST and ALT, hemoglobin 9.2, platelets 360. He reports no melena or hematochezia, no recent stool cards.  Weight is down approximally 8 pounds from the last visit. Reports no sense of palpitations, has NYHA class 2-3 dyspnea on exertion. No falls or syncope.  Heart rate today is in the 80s on current medical regimen.   No Known Allergies  Current Outpatient Prescriptions  Medication Sig Dispense Refill  . albuterol (VENTOLIN HFA) 108 (90 BASE) MCG/ACT inhaler Inhale 2 puffs into the lungs every 6 (six) hours as needed. Shortness of breath      . cyanocobalamin 1000 MCG tablet Take 1,000 mcg by mouth daily.      Marland Kitchen diltiazem (CARDIZEM CD) 180 MG 24 hr capsule Take 1 capsule (180 mg total) by mouth 2 (two) times daily. New medication for your heart rate and your blood pressure.  60 capsule  3  . Fluticasone-Salmeterol (ADVAIR) 250-50 MCG/DOSE AEPB Inhale 1 puff into the lungs every 12 (twelve) hours.        . furosemide (LASIX) 40 MG tablet Take 20 mg by mouth daily.      Marland Kitchen ipratropium-albuterol (DUONEB) 0.5-2.5 (3) MG/3ML SOLN Take 3 mLs by nebulization 4 (four) times daily.      . iron polysaccharides (NIFEREX) 150 MG capsule Take 150 mg by mouth daily. Iron supplementation for your anemia.      . metFORMIN (GLUCOPHAGE) 1000 MG tablet Take 1,000 mg by mouth 2 (two) times daily.      . mometasone (NASONEX) 50 MCG/ACT nasal spray Place 2 sprays into the nose daily.      Marland Kitchen omeprazole (PRILOSEC) 20 MG capsule Take 20 mg by mouth daily.       . Rivaroxaban (XARELTO) 15 MG TABS tablet Take 15 mg by mouth daily.      .  roflumilast (DALIRESP) 500 MCG TABS tablet Take 500 mcg by mouth daily.      . silodosin (RAPAFLO) 8 MG CAPS capsule Take 8 mg by mouth daily with breakfast.      . simvastatin (ZOCOR) 40 MG tablet Take 40 mg by mouth daily.       Marland Kitchen tiotropium (SPIRIVA) 18 MCG inhalation capsule Place 18 mcg into inhaler and inhale daily.       No current facility-administered medications for this visit.   Facility-Administered Medications Ordered in Other Visits  Medication Dose Route Frequency Provider Last Rate Last Dose  . diltiazem (CARDIZEM) injection 20 mg  20 mg Intravenous PRN Kathlen Brunswick, MD        Past Medical History  Diagnosis Date  . Arteriosclerotic cardiovascular disease (ASCVD)     Myocardial infarction in 1980; chronic dyspnea on exertion; 04/2012-normal EF by echo  . Hyperlipidemia   . Hypertension   . Diabetes mellitus, type II   . Arthritis   . COPD (chronic obstructive pulmonary disease)     Home oxygen  . Colon polyps 08/2011    Per colonoscopy, Dr. Karilyn Cota.  Marland Kitchen BPH (benign prostatic hyperplasia)   . Urinary retention 11/15/2011  . Renal mass, right 11/16/2011  . Iron deficiency anemia 11/14/2011  . Atrial flutter  with rapid ventricular response 04/29/2012  . Lung cancer     Social History Mr. Cerone reports that he quit smoking about 4 years ago. His smoking use included Cigarettes. He has a 90 pack-year smoking history. He has never used smokeless tobacco. Mr. Karapetyan reports that he does not drink alcohol.  Review of Systems As outlined above. No chest pain. No orthopnea or PND.  Physical Examination Filed Vitals:   09/22/12 1347  BP: 110/70  Pulse: 88   Filed Weights   09/22/12 1347  Weight: 162 lb 12.8 oz (73.846 kg)   Patient in no acute distress. HEENT: Conjunctiva and lids normal, oropharynx clear. Neck: Supple, no elevated JVP, no thyromegaly. Lungs: Clear to auscultation, nonlabored breathing at rest. Cardiac: Irregular, no S3, soft  systolic murmur, no pericardial rub. Abdomen: Soft, nontender, bowel sounds present. Extremities: No pitting edema, distal pulses 1-2+.   Problem List and Plan   Atrial flutter Heart rate is adequately controlled today in the office, currently in the 80s on Cardizem CD 180 mg twice daily. He continues on Xarelto, samples were given. No changes made. Followup arranged.  Iron deficiency anemia Hemoglobin 9.2 by lab work in early October. Stool cards given.    Jonelle Sidle, M.D., F.A.C.C.

## 2012-09-22 NOTE — Patient Instructions (Addendum)
Your physician recommends that you schedule a follow-up appointment in: 2 months  Stool cards

## 2012-10-27 ENCOUNTER — Ambulatory Visit (INDEPENDENT_AMBULATORY_CARE_PROVIDER_SITE_OTHER): Payer: Medicare Other | Admitting: Adult Health

## 2012-10-27 ENCOUNTER — Encounter: Payer: Self-pay | Admitting: Adult Health

## 2012-10-27 VITALS — BP 114/70 | HR 110 | Ht 68.0 in | Wt 158.0 lb

## 2012-10-27 DIAGNOSIS — J441 Chronic obstructive pulmonary disease with (acute) exacerbation: Secondary | ICD-10-CM

## 2012-10-27 DIAGNOSIS — R222 Localized swelling, mass and lump, trunk: Secondary | ICD-10-CM

## 2012-10-27 DIAGNOSIS — I4892 Unspecified atrial flutter: Secondary | ICD-10-CM

## 2012-10-27 DIAGNOSIS — R918 Other nonspecific abnormal finding of lung field: Secondary | ICD-10-CM

## 2012-10-27 NOTE — Patient Instructions (Addendum)
Your physician recommends that you schedule a follow-up appointment in: A REFERRAL WILL BE PLACED FOR EP FIRST AVAILABLE  PLEASE KEEP UPCOMING APT 11-02-12 AT 11:15AM WITH GT

## 2012-10-27 NOTE — Progress Notes (Deleted)
Name: Brendan Hall    DOB: [birth year]  Age: 78 y.o.  MR#: [id number]       PCP:  Colon Branch, MD      Insurance: @PAYORNAME @   CC:   No chief complaint on file.   VS BP 114/70  Pulse 110  Ht 5\' 8"  (1.727 m)  Wt 158 lb (71.668 kg)  BMI 24.02 kg/m2  Weights Current Weight  10/27/12 158 lb (71.668 kg)  09/22/12 162 lb 12.8 oz (73.846 kg)  08/29/12 161 lb 2.5 oz (73.1 kg)    Blood Pressure  BP Readings from Last 3 Encounters:  10/27/12 114/70  09/22/12 110/70  08/29/12 124/73     Admit date:  (Not on file) Last encounter with RMR:  08/03/2012   Allergy No Known Allergies  Current Outpatient Prescriptions  Medication Sig Dispense Refill  . albuterol (VENTOLIN HFA) 108 (90 BASE) MCG/ACT inhaler Inhale 2 puffs into the lungs every 6 (six) hours as needed. Shortness of breath      . cyanocobalamin 1000 MCG tablet Take 1,000 mcg by mouth daily.      Marland Kitchen diltiazem (CARDIZEM CD) 180 MG 24 hr capsule Take 1 capsule (180 mg total) by mouth 2 (two) times daily. New medication for your heart rate and your blood pressure.  60 capsule  3  . Fluticasone-Salmeterol (ADVAIR) 250-50 MCG/DOSE AEPB Inhale 1 puff into the lungs every 12 (twelve) hours.        . furosemide (LASIX) 40 MG tablet Take 20 mg by mouth daily.      Marland Kitchen ipratropium-albuterol (DUONEB) 0.5-2.5 (3) MG/3ML SOLN Take 3 mLs by nebulization 4 (four) times daily.      . iron polysaccharides (NIFEREX) 150 MG capsule Take 150 mg by mouth daily. Iron supplementation for your anemia.      . metFORMIN (GLUCOPHAGE) 1000 MG tablet Take 1,000 mg by mouth 2 (two) times daily.      . mometasone (NASONEX) 50 MCG/ACT nasal spray Place 2 sprays into the nose daily.      Marland Kitchen omeprazole (PRILOSEC) 20 MG capsule Take 20 mg by mouth daily.       . Rivaroxaban (XARELTO) 15 MG TABS tablet Take 15 mg by mouth daily.      . roflumilast (DALIRESP) 500 MCG TABS tablet Take 500 mcg by mouth daily.      . silodosin (RAPAFLO) 8 MG CAPS capsule Take 8 mg  by mouth daily with breakfast.      . simvastatin (ZOCOR) 40 MG tablet Take 40 mg by mouth daily.       Marland Kitchen tiotropium (SPIRIVA) 18 MCG inhalation capsule Place 18 mcg into inhaler and inhale daily.       No current facility-administered medications for this visit.   Facility-Administered Medications Ordered in Other Visits  Medication Dose Route Frequency Provider Last Rate Last Dose  . diltiazem (CARDIZEM) injection 20 mg  20 mg Intravenous PRN Kathlen Brunswick, MD        Discontinued Meds:   There are no discontinued medications.  Patient Active Problem List  Diagnosis  . COPD with exacerbation  . Acute renal failure  . Iron deficiency anemia  . DM type 2 (diabetes mellitus, type 2)  . Hypercholesteremia  . History of lung cancer  . Urinary retention  . Acute bronchitis  . Renal cysts, acquired, bilateral  . Acute exacerbation of chronic obstructive pulmonary disease (COPD)  . Acute respiratory failure  . Atrial flutter  . Acute renal  insufficiency  . COPD exacerbation  . Lung mass  . CKD (chronic kidney disease), stage III    LABS Admission on 08/28/2012, Discharged on 08/29/2012  Component Date Value  . WBC 08/28/2012 5.9   . RBC 08/28/2012 3.74*  . Hemoglobin 08/28/2012 8.8*  . HCT 08/28/2012 29.7*  . MCV 08/28/2012 79.4   . MCH 08/28/2012 23.5*  . MCHC 08/28/2012 29.6*  . RDW 08/28/2012 16.2*  . Platelets 08/28/2012 342   . Neutrophils Relative 08/28/2012 64   . Neutro Abs 08/28/2012 3.8   . Lymphocytes Relative 08/28/2012 25   . Lymphs Abs 08/28/2012 1.5   . Monocytes Relative 08/28/2012 9   . Monocytes Absolute 08/28/2012 0.6   . Eosinophils Relative 08/28/2012 2   . Eosinophils Absolute 08/28/2012 0.1   . Basophils Relative 08/28/2012 1   . Basophils Absolute 08/28/2012 0.0   . Sodium 08/28/2012 138   . Potassium 08/28/2012 5.2*  . Chloride 08/28/2012 103   . CO2 08/28/2012 26   . Glucose, Bld 08/28/2012 146*  . BUN 08/28/2012 27*  . Creatinine,  Ser 08/28/2012 1.87*  . Calcium 08/28/2012 9.9   . Total Protein 08/28/2012 7.6   . Albumin 08/28/2012 3.7   . AST 08/28/2012 15   . ALT 08/28/2012 8   . Alkaline Phosphatase 08/28/2012 67   . Total Bilirubin 08/28/2012 0.3   . GFR calc non Af Amer 08/28/2012 32*  . GFR calc Af Amer 08/28/2012 37*  . Troponin I 08/28/2012 <0.30   . Pro B Natriuretic peptid* 08/28/2012 1091.0*  . WBC 08/29/2012 4.8   . RBC 08/29/2012 3.99*  . Hemoglobin 08/29/2012 9.2*  . HCT 08/29/2012 31.9*  . MCV 08/29/2012 79.9   . MCH 08/29/2012 23.1*  . MCHC 08/29/2012 28.8*  . RDW 08/29/2012 16.3*  . Platelets 08/29/2012 360   . Sodium 08/29/2012 136   . Potassium 08/29/2012 5.0   . Chloride 08/29/2012 99   . CO2 08/29/2012 25   . Glucose, Bld 08/29/2012 266*  . BUN 08/29/2012 25*  . Creatinine, Ser 08/29/2012 1.63*  . Calcium 08/29/2012 10.3   . Total Protein 08/29/2012 8.2   . Albumin 08/29/2012 3.8   . AST 08/29/2012 14   . ALT 08/29/2012 8   . Alkaline Phosphatase 08/29/2012 67   . Total Bilirubin 08/29/2012 0.4   . GFR calc non Af Amer 08/29/2012 38*  . GFR calc Af Amer 08/29/2012 44*  . Hemoglobin A1C 08/28/2012 7.0*  . Mean Plasma Glucose 08/28/2012 154*  . Glucose-Capillary 08/29/2012 378*  . Glucose-Capillary 08/29/2012 243*  Office Visit on 08/03/2012  Component Date Value  . Sodium 08/03/2012 143   . Potassium 08/03/2012 4.9   . Chloride 08/03/2012 105   . CO2 08/03/2012 29   . Glucose, Bld 08/03/2012 77   . BUN 08/03/2012 24*  . Creat 08/03/2012 1.54*  . Calcium 08/03/2012 9.8   . Brain Natriuretic Peptide 08/03/2012 160.8*     Results for this Opt Visit:     Results for orders placed during the hospital encounter of 08/28/12  CBC WITH DIFFERENTIAL      Component Value Range   WBC 5.9  4.0 - 10.5 K/uL   RBC 3.74 (*) 4.22 - 5.81 MIL/uL   Hemoglobin 8.8 (*) 13.0 - 17.0 g/dL   HCT 16.1 (*) 09.6 - 04.5 %   MCV 79.4  78.0 - 100.0 fL   MCH 23.5 (*) 26.0 - 34.0 pg   MCHC  29.6 (*) 30.0 -  36.0 g/dL   RDW 40.9 (*) 81.1 - 91.4 %   Platelets 342  150 - 400 K/uL   Neutrophils Relative 64  43 - 77 %   Neutro Abs 3.8  1.7 - 7.7 K/uL   Lymphocytes Relative 25  12 - 46 %   Lymphs Abs 1.5  0.7 - 4.0 K/uL   Monocytes Relative 9  3 - 12 %   Monocytes Absolute 0.6  0.1 - 1.0 K/uL   Eosinophils Relative 2  0 - 5 %   Eosinophils Absolute 0.1  0.0 - 0.7 K/uL   Basophils Relative 1  0 - 1 %   Basophils Absolute 0.0  0.0 - 0.1 K/uL  COMPREHENSIVE METABOLIC PANEL      Component Value Range   Sodium 138  135 - 145 mEq/L   Potassium 5.2 (*) 3.5 - 5.1 mEq/L   Chloride 103  96 - 112 mEq/L   CO2 26  19 - 32 mEq/L   Glucose, Bld 146 (*) 70 - 99 mg/dL   BUN 27 (*) 6 - 23 mg/dL   Creatinine, Ser 7.82 (*) 0.50 - 1.35 mg/dL   Calcium 9.9  8.4 - 95.6 mg/dL   Total Protein 7.6  6.0 - 8.3 g/dL   Albumin 3.7  3.5 - 5.2 g/dL   AST 15  0 - 37 U/L   ALT 8  0 - 53 U/L   Alkaline Phosphatase 67  39 - 117 U/L   Total Bilirubin 0.3  0.3 - 1.2 mg/dL   GFR calc non Af Amer 32 (*) >90 mL/min   GFR calc Af Amer 37 (*) >90 mL/min  TROPONIN I      Component Value Range   Troponin I <0.30  <0.30 ng/mL  PRO B NATRIURETIC PEPTIDE      Component Value Range   Pro B Natriuretic peptide (BNP) 1091.0 (*) 0 - 450 pg/mL  CBC      Component Value Range   WBC 4.8  4.0 - 10.5 K/uL   RBC 3.99 (*) 4.22 - 5.81 MIL/uL   Hemoglobin 9.2 (*) 13.0 - 17.0 g/dL   HCT 21.3 (*) 08.6 - 57.8 %   MCV 79.9  78.0 - 100.0 fL   MCH 23.1 (*) 26.0 - 34.0 pg   MCHC 28.8 (*) 30.0 - 36.0 g/dL   RDW 46.9 (*) 62.9 - 52.8 %   Platelets 360  150 - 400 K/uL  COMPREHENSIVE METABOLIC PANEL      Component Value Range   Sodium 136  135 - 145 mEq/L   Potassium 5.0  3.5 - 5.1 mEq/L   Chloride 99  96 - 112 mEq/L   CO2 25  19 - 32 mEq/L   Glucose, Bld 266 (*) 70 - 99 mg/dL   BUN 25 (*) 6 - 23 mg/dL   Creatinine, Ser 4.13 (*) 0.50 - 1.35 mg/dL   Calcium 24.4  8.4 - 01.0 mg/dL   Total Protein 8.2  6.0 - 8.3 g/dL    Albumin 3.8  3.5 - 5.2 g/dL   AST 14  0 - 37 U/L   ALT 8  0 - 53 U/L   Alkaline Phosphatase 67  39 - 117 U/L   Total Bilirubin 0.4  0.3 - 1.2 mg/dL   GFR calc non Af Amer 38 (*) >90 mL/min   GFR calc Af Amer 44 (*) >90 mL/min  HEMOGLOBIN A1C      Component Value Range   Hemoglobin A1C  7.0 (*) <5.7 %   Mean Plasma Glucose 154 (*) <117 mg/dL  GLUCOSE, CAPILLARY      Component Value Range   Glucose-Capillary 378 (*) 70 - 99 mg/dL  GLUCOSE, CAPILLARY      Component Value Range   Glucose-Capillary 243 (*) 70 - 99 mg/dL    EKG Orders placed in visit on 10/27/12  . EKG 12-LEAD     Prior Assessment and Plan Problem List as of 10/27/2012          DM type 2 (diabetes mellitus, type 2)   Hypercholesteremia   Last Assessment & Plan Note   06/18/2012 Office Visit Signed 06/18/2012 11:33 AM by Jodelle Gross, NP    He is followed by Dr.Quereshi for labs.     History of lung cancer   Last Assessment & Plan Note   06/18/2012 Office Visit Signed 06/18/2012 11:32 AM by Jodelle Gross, NP    Abnormal CT scan as discussed above. He is encouraged to keep appointment with Dr. Juanetta Gosling for continue pulmonary management. He may need referral to oncologist once evaluated with his history of lung CA.    COPD with exacerbation   Last Assessment & Plan Note   08/03/2012 Office Visit Signed 08/03/2012  5:26 PM by Jodelle Gross, NP    He will continue on oxygen 2 L per nasal cannula deceased in using at home. I explained to him that his COPD is progressive. He is not smoking. He will continue his albuterol inhalers.    Acute renal failure   Iron deficiency anemia   Last Assessment & Plan Note   09/22/2012 Office Visit Signed 09/22/2012  2:08 PM by Jonelle Sidle, MD    Hemoglobin 9.2 by lab work in early October. Stool cards given.    Urinary retention   Acute bronchitis   Renal cysts, acquired, bilateral   Acute exacerbation of chronic obstructive pulmonary disease (COPD)   Acute  respiratory failure   Last Assessment & Plan Note   05/19/2012 Office Visit Signed 05/19/2012  3:19 PM by Jodelle Gross, NP    He will be referred to Dr. Juanetta Gosling for continued pulmonary care. He is on O2 and has been for 5 years per PCP but no pulmonary follow-up since hospitalization. He will have a CXR completed to evaluate for recurrence of CHF, although no edema or JVD is seen. His breathing status has worsened.    Atrial flutter   Last Assessment & Plan Note   09/22/2012 Office Visit Signed 09/22/2012  2:08 PM by Jonelle Sidle, MD    Heart rate is adequately controlled today in the office, currently in the 80s on Cardizem CD 180 mg twice daily. He continues on Xarelto, samples were given. No changes made. Followup arranged.    Acute renal insufficiency   COPD exacerbation   Lung mass   CKD (chronic kidney disease), stage III       Imaging: No results found.   FRS Calculation: Score not calculated

## 2012-10-27 NOTE — Assessment & Plan Note (Signed)
This is being followed for ongoing assessment via Dr. Juanetta Gosling. We will defer to him and oncology for consideration of treatment.

## 2012-10-27 NOTE — Assessment & Plan Note (Signed)
He has not appeared to be accepting of his lung condition. He is not compliant concerning use of oxygen, and is not always using his nebulizer treatments. He has become very grumpy and a straight about his overall health status. I referred him back to Dr. Juanetta Gosling for continued evaluation and treatment of COPD. Some of the medications he is on to include Atrovent and albuterol can contribute to elevated heart rhythms.

## 2012-10-27 NOTE — Progress Notes (Signed)
HPI: Mr. Brendan Hall is a 78 year old male patient of Dr. Dietrich Pates, we are following for ongoing assessment of treatment of atrial flutter. The patient is also on Xarelto for which we provided samples. He has a history of COPD and is oxygen dependent, but does not like to wear it. He complains today of his heart rate remaining elevated, profound fatigue, and chronic shortness of breath. He appears frustrated his current health status. He wants something done about his heart rate and rhythm. He denies chest pain or syncope, however he says "I cannot do anything because I am so tired" he is amenable to the ID of being seen by electrophysiology for recommendations and ablation. He continues to have New [place] Heart Association addition class 2-3 dyspnea on exertion.  No Known Allergies  Current Outpatient Prescriptions  Medication Sig Dispense Refill  . albuterol (VENTOLIN HFA) 108 (90 BASE) MCG/ACT inhaler Inhale 2 puffs into the lungs every 6 (six) hours as needed. Shortness of breath      . cyanocobalamin 1000 MCG tablet Take 1,000 mcg by mouth daily.      Marland Kitchen diltiazem (CARDIZEM CD) 180 MG 24 hr capsule Take 1 capsule (180 mg total) by mouth 2 (two) times daily. New medication for your heart rate and your blood pressure.  60 capsule  3  . Fluticasone-Salmeterol (ADVAIR) 250-50 MCG/DOSE AEPB Inhale 1 puff into the lungs every 12 (twelve) hours.        . furosemide (LASIX) 40 MG tablet Take 20 mg by mouth daily.      Marland Kitchen ipratropium-albuterol (DUONEB) 0.5-2.5 (3) MG/3ML SOLN Take 3 mLs by nebulization 4 (four) times daily.      . iron polysaccharides (NIFEREX) 150 MG capsule Take 150 mg by mouth daily. Iron supplementation for your anemia.      . metFORMIN (GLUCOPHAGE) 1000 MG tablet Take 1,000 mg by mouth 2 (two) times daily.      . mometasone (NASONEX) 50 MCG/ACT nasal spray Place 2 sprays into the nose daily.      Marland Kitchen omeprazole (PRILOSEC) 20 MG capsule Take 20 mg by mouth daily.       . Rivaroxaban  (XARELTO) 15 MG TABS tablet Take 15 mg by mouth daily.      . roflumilast (DALIRESP) 500 MCG TABS tablet Take 500 mcg by mouth daily.      . silodosin (RAPAFLO) 8 MG CAPS capsule Take 8 mg by mouth daily with breakfast.      . simvastatin (ZOCOR) 40 MG tablet Take 40 mg by mouth daily.       Marland Kitchen tiotropium (SPIRIVA) 18 MCG inhalation capsule Place 18 mcg into inhaler and inhale daily.       No current facility-administered medications for this visit.   Facility-Administered Medications Ordered in Other Visits  Medication Dose Route Frequency Provider Last Rate Last Dose  . diltiazem (CARDIZEM) injection 20 mg  20 mg Intravenous PRN Kathlen Brunswick, MD        Past Medical History  Diagnosis Date  . Arteriosclerotic cardiovascular disease (ASCVD)     Myocardial infarction in 1980; chronic dyspnea on exertion; 04/2012-normal EF by echo  . Hyperlipidemia   . Hypertension   . Diabetes mellitus, type II   . Arthritis   . COPD (chronic obstructive pulmonary disease)     Home oxygen  . Colon polyps 08/2011    Per colonoscopy, Dr. Karilyn Cota.  Marland Kitchen BPH (benign prostatic hyperplasia)   . Urinary retention 11/15/2011  . Renal mass,  right 11/16/2011  . Iron deficiency anemia 11/14/2011  . Atrial flutter with rapid ventricular response 04/29/2012  . Lung cancer     Past Surgical History  Procedure Date  . Soft tissue cyst excision     Face  . Colonoscopy 08/28/2011    Procedure: COLONOSCOPY;  Surgeon: Malissa Hippo, MD;  Location: [facility];  Service: Endoscopy;  Laterality: N/A;    JXB:JYNWGN of systems complete and found to be negative unless listed above  PHYSICAL EXAM BP 114/70  Pulse 110  Ht 5\' 8"  (1.727 m)  Wt 158 lb (71.668 kg)  BMI 24.02 kg/m2  General: Well developed, well nourished, in no acute distress Head: Eyes PERRLA, No xanthomas.   Normal cephalic and atramatic  Lungs: Bilateral crackles with inspiratory wheezes. Heart: HRIR S1 S2, .  Pulses are 2+ & equal.             No carotid bruit. No JVD.  No abdominal bruits. No femoral bruits. Abdomen: Bowel sounds are positive, abdomen soft and non-tender without masses or                  Hernia's noted. Msk:  Back normal, normal gait. Normal strength and tone for age. Extremities: No clubbing, cyanosis or edema.  DP +1 Neuro: Alert and oriented X 3. Psych:  Good affect, responds appropriately  EKG: Atrial flutter with variable conduction, 2:1-3:1.   ASSESSMENT AND PLAN

## 2012-10-27 NOTE — Assessment & Plan Note (Signed)
Brendan Hall is become very frustrated with his heart rate and rhythm. He is convinced that his breathing status is directly related to the fact that we cannot get his heart rate better controlled. His heart rate is running between 90 and 110 here in the office. I have talked with him about the possibility of an EP evaluation and consideration for atrial flutter ablation. He has now decided that he would like to explore this further. I have made an appointment for him to see Dr. Gilman Schmidt on December ninth, at 11:15. I have given him literature concerning electrophysiology procedures. He is being given samples of Xarelto 15 mg.

## 2012-11-02 ENCOUNTER — Ambulatory Visit (INDEPENDENT_AMBULATORY_CARE_PROVIDER_SITE_OTHER): Payer: Medicare Other | Admitting: Internal Medicine

## 2012-11-02 ENCOUNTER — Encounter: Payer: Self-pay | Admitting: Internal Medicine

## 2012-11-02 ENCOUNTER — Encounter: Payer: Self-pay | Admitting: *Deleted

## 2012-11-02 VITALS — BP 124/62 | HR 106 | Ht 68.0 in | Wt 158.0 lb

## 2012-11-02 DIAGNOSIS — J441 Chronic obstructive pulmonary disease with (acute) exacerbation: Secondary | ICD-10-CM

## 2012-11-02 DIAGNOSIS — I4892 Unspecified atrial flutter: Secondary | ICD-10-CM

## 2012-11-02 DIAGNOSIS — Z7901 Long term (current) use of anticoagulants: Secondary | ICD-10-CM

## 2012-11-02 NOTE — Assessment & Plan Note (Signed)
I've discussed the treatment options with the patient. The risk, goals, benefits, and expectations of catheter ablation of atrial flutter have been discussed with the patient and his daughter who is with him today. He would like to proceed with catheter ablation. This be scheduled early as possible pending at time.

## 2012-11-02 NOTE — Patient Instructions (Addendum)
Your physician recommends that you schedule a follow-up appointment in: After ablation

## 2012-11-02 NOTE — Progress Notes (Signed)
HPI Brendan Hall is referred today by Dr. Diona Browner for evaluation of atrial flutter. He is a very pleasant 78 year old man with a history of COPD and hypertension, who essentially found to be in atrial flutter with a rapid ventricular response after presenting with increasing dyspnea. He does not feel palpitations except occasionally. He denies syncope. He has shortness of breath with exertion and mild peripheral edema. The patient previously had been quite active but now is unable to do things that he is normally done like using a chainsaw or hunt rabbits. With vigorous activity he has to stop and rest. No Known Allergies   Current Outpatient Prescriptions  Medication Sig Dispense Refill  . albuterol (VENTOLIN HFA) 108 (90 BASE) MCG/ACT inhaler Inhale 2 puffs into the lungs every 6 (six) hours as needed. Shortness of breath      . cyanocobalamin 1000 MCG tablet Take 1,000 mcg by mouth daily.      Marland Kitchen diltiazem (CARDIZEM CD) 180 MG 24 hr capsule Take 1 capsule (180 mg total) by mouth 2 (two) times daily. New medication for your heart rate and your blood pressure.  60 capsule  3  . Fluticasone-Salmeterol (ADVAIR) 250-50 MCG/DOSE AEPB Inhale 1 puff into the lungs every 12 (twelve) hours.        . furosemide (LASIX) 40 MG tablet Take 20 mg by mouth daily.      Marland Kitchen ipratropium-albuterol (DUONEB) 0.5-2.5 (3) MG/3ML SOLN Take 3 mLs by nebulization 4 (four) times daily.      . iron polysaccharides (NIFEREX) 150 MG capsule Take 150 mg by mouth daily. Iron supplementation for your anemia.      . metFORMIN (GLUCOPHAGE) 1000 MG tablet Take 1,000 mg by mouth 2 (two) times daily.      . mometasone (NASONEX) 50 MCG/ACT nasal spray Place 2 sprays into the nose daily.      Marland Kitchen omeprazole (PRILOSEC) 20 MG capsule Take 20 mg by mouth daily.       . Rivaroxaban (XARELTO) 15 MG TABS tablet Take 15 mg by mouth daily.      . roflumilast (DALIRESP) 500 MCG TABS tablet Take 500 mcg by mouth daily.      . silodosin (RAPAFLO)  8 MG CAPS capsule Take 8 mg by mouth daily with breakfast.      . simvastatin (ZOCOR) 40 MG tablet Take 40 mg by mouth daily.       Marland Kitchen tiotropium (SPIRIVA) 18 MCG inhalation capsule Place 18 mcg into inhaler and inhale daily.       No current facility-administered medications for this visit.   Facility-Administered Medications Ordered in Other Visits  Medication Dose Route Frequency Provider Last Rate Last Dose  . diltiazem (CARDIZEM) injection 20 mg  20 mg Intravenous PRN Kathlen Brunswick, MD         Past Medical History  Diagnosis Date  . Arteriosclerotic cardiovascular disease (ASCVD)     Myocardial infarction in 1980; chronic dyspnea on exertion; 04/2012-normal EF by echo  . Hyperlipidemia   . Hypertension   . Diabetes mellitus, type II   . Arthritis   . COPD (chronic obstructive pulmonary disease)     Home oxygen  . Colon polyps 08/2011    Per colonoscopy, Dr. Karilyn Cota.  Marland Kitchen BPH (benign prostatic hyperplasia)   . Urinary retention 11/15/2011  . Renal mass, right 11/16/2011  . Iron deficiency anemia 11/14/2011  . Atrial flutter with rapid ventricular response 04/29/2012  . Lung cancer     ROS:  All systems reviewed and negative except as noted in the HPI.   Past Surgical History  Procedure Date  . Soft tissue cyst excision     Face  . Colonoscopy 08/28/2011    Procedure: COLONOSCOPY;  Surgeon: Malissa Hippo, MD;  Location: [facility];  Service: Endoscopy;  Laterality: N/A;     Family History  Problem Relation Age of Onset  . Cancer Mother   . Cancer Sister   . Cancer Brother      History   Social History  . Marital Status: Married    Spouse Name: N/A    Number of Children: N/A  . Years of Education: N/A   Occupational History  . Not on file.   Social History Main Topics  . Smoking status: Former Smoker -- 1.5 packs/day for 60 years    Types: Cigarettes    Quit date: 08/06/2008  . Smokeless tobacco: Never Used  . Alcohol Use: No     Comment:  occasional   . Drug Use: No  . Sexually Active: No   Other Topics Concern  . Not on file   Social History Narrative  . No narrative on file     BP 124/62  Pulse 106  Ht 5\' 8"  (1.727 m)  Wt 158 lb (71.668 kg)  BMI 24.02 kg/m2  SpO2 90%  Physical Exam:  Well appearing 78 year old man, NAD HEENT: Unremarkable Neck:  7 cm JVD, no thyromegally Lungs:  Clear with no wheezes, rales, or rhonchi. HEART:  IRegular tachycardia rate rhythm, no murmurs, no rubs, no clicks Abd:  soft, positive bowel sounds, no organomegally, no rebound, no guarding Ext:  2 plus pulses, no edema, no cyanosis, no clubbing Skin:  No rashes no nodules Neuro:  CN II through XII intact, motor grossly intact  EKG Atrial flutter with a variable ventricular response  Assess/Plan:

## 2012-11-02 NOTE — Assessment & Plan Note (Addendum)
Is unclear whether or not his shortness of breath is all related atrial flutter or whether it is related to COPD or some combination. It is clear that his atrial flutter appears to be worsening his symptoms. He will remain on bronchodilators following catheter ablation.

## 2012-11-04 ENCOUNTER — Encounter (HOSPITAL_COMMUNITY): Payer: Self-pay | Admitting: Respiratory Therapy

## 2012-11-05 ENCOUNTER — Other Ambulatory Visit (HOSPITAL_COMMUNITY): Payer: Self-pay | Admitting: Nurse Practitioner

## 2012-11-05 DIAGNOSIS — J449 Chronic obstructive pulmonary disease, unspecified: Secondary | ICD-10-CM

## 2012-11-08 ENCOUNTER — Other Ambulatory Visit: Payer: Self-pay

## 2012-11-08 ENCOUNTER — Emergency Department (HOSPITAL_COMMUNITY): Payer: Medicare Other

## 2012-11-08 ENCOUNTER — Inpatient Hospital Stay (HOSPITAL_COMMUNITY)
Admission: EM | Admit: 2012-11-08 | Discharge: 2012-11-14 | DRG: 190 | Disposition: A | Discharge status: Home Health | Payer: Medicare Other | Attending: Internal Medicine | Admitting: Internal Medicine

## 2012-11-08 ENCOUNTER — Encounter (HOSPITAL_COMMUNITY): Payer: Self-pay

## 2012-11-08 DIAGNOSIS — I129 Hypertensive chronic kidney disease with stage 1 through stage 4 chronic kidney disease, or unspecified chronic kidney disease: Secondary | ICD-10-CM | POA: Diagnosis present

## 2012-11-08 DIAGNOSIS — I252 Old myocardial infarction: Secondary | ICD-10-CM

## 2012-11-08 DIAGNOSIS — J189 Pneumonia, unspecified organism: Secondary | ICD-10-CM | POA: Diagnosis present

## 2012-11-08 DIAGNOSIS — I4892 Unspecified atrial flutter: Secondary | ICD-10-CM | POA: Diagnosis present

## 2012-11-08 DIAGNOSIS — D649 Anemia, unspecified: Secondary | ICD-10-CM

## 2012-11-08 DIAGNOSIS — Z9981 Dependence on supplemental oxygen: Secondary | ICD-10-CM

## 2012-11-08 DIAGNOSIS — N183 Chronic kidney disease, stage 3 unspecified: Secondary | ICD-10-CM | POA: Diagnosis present

## 2012-11-08 DIAGNOSIS — J9600 Acute respiratory failure, unspecified whether with hypoxia or hypercapnia: Secondary | ICD-10-CM

## 2012-11-08 DIAGNOSIS — D509 Iron deficiency anemia, unspecified: Secondary | ICD-10-CM | POA: Diagnosis present

## 2012-11-08 DIAGNOSIS — Z85118 Personal history of other malignant neoplasm of bronchus and lung: Secondary | ICD-10-CM

## 2012-11-08 DIAGNOSIS — J441 Chronic obstructive pulmonary disease with (acute) exacerbation: Secondary | ICD-10-CM

## 2012-11-08 DIAGNOSIS — K922 Gastrointestinal hemorrhage, unspecified: Secondary | ICD-10-CM

## 2012-11-08 DIAGNOSIS — E119 Type 2 diabetes mellitus without complications: Secondary | ICD-10-CM | POA: Diagnosis present

## 2012-11-08 DIAGNOSIS — R918 Other nonspecific abnormal finding of lung field: Secondary | ICD-10-CM | POA: Diagnosis present

## 2012-11-08 DIAGNOSIS — Z87891 Personal history of nicotine dependence: Secondary | ICD-10-CM

## 2012-11-08 DIAGNOSIS — J9620 Acute and chronic respiratory failure, unspecified whether with hypoxia or hypercapnia: Secondary | ICD-10-CM | POA: Diagnosis present

## 2012-11-08 DIAGNOSIS — J9611 Chronic respiratory failure with hypoxia: Secondary | ICD-10-CM | POA: Diagnosis present

## 2012-11-08 DIAGNOSIS — D500 Iron deficiency anemia secondary to blood loss (chronic): Secondary | ICD-10-CM

## 2012-11-08 DIAGNOSIS — Z79899 Other long term (current) drug therapy: Secondary | ICD-10-CM

## 2012-11-08 DIAGNOSIS — I2510 Atherosclerotic heart disease of native coronary artery without angina pectoris: Secondary | ICD-10-CM | POA: Diagnosis present

## 2012-11-08 DIAGNOSIS — E785 Hyperlipidemia, unspecified: Secondary | ICD-10-CM | POA: Diagnosis present

## 2012-11-08 DIAGNOSIS — M129 Arthropathy, unspecified: Secondary | ICD-10-CM | POA: Diagnosis present

## 2012-11-08 LAB — BASIC METABOLIC PANEL
Calcium: 10 mg/dL (ref 8.4–10.5)
Creatinine, Ser: 1.46 mg/dL — ABNORMAL HIGH (ref 0.50–1.35)
GFR calc non Af Amer: 44 mL/min — ABNORMAL LOW (ref >90)
Sodium: 141 mEq/L (ref 135–145)

## 2012-11-08 LAB — CBC WITH DIFFERENTIAL/PLATELET
Basophils Absolute: 0 10*3/uL (ref 0.0–0.1)
Eosinophils Absolute: 0.1 10*3/uL (ref 0.0–0.7)
Eosinophils Relative: 1 % (ref 0–5)
MCH: 20.7 pg — ABNORMAL LOW (ref 26.0–34.0)
MCHC: 28.3 g/dL — ABNORMAL LOW (ref 30.0–36.0)
MCV: 73.2 fL — ABNORMAL LOW (ref 78.0–100.0)
Monocytes Absolute: 0.9 10*3/uL (ref 0.1–1.0)
Platelets: 316 10*3/uL (ref 150–400)
RDW: 18.2 % — ABNORMAL HIGH (ref 11.5–15.5)

## 2012-11-08 LAB — TROPONIN I: Troponin I: <0.3 ng/mL (ref <0.30)

## 2012-11-08 LAB — HEPATIC FUNCTION PANEL
ALT: 14 U/L (ref 0–53)
Albumin: 3.4 g/dL — ABNORMAL LOW (ref 3.5–5.2)
Alkaline Phosphatase: 68 U/L (ref 39–117)
Total Protein: 7.6 g/dL (ref 6.0–8.3)

## 2012-11-08 LAB — PRO B NATRIURETIC PEPTIDE: Pro B Natriuretic peptide (BNP): 993.1 pg/mL — ABNORMAL HIGH (ref 0–450)

## 2012-11-08 LAB — RETICULOCYTES: Retic Count, Absolute: 45.2 10*3/uL (ref 19.0–186.0)

## 2012-11-08 MED ORDER — MAGNESIUM HYDROXIDE 400 MG/5ML PO SUSP: 30.0000 mL | Freq: Every day | ORAL | Status: DC | PRN

## 2012-11-08 MED ORDER — SODIUM CHLORIDE 0.9 % IV SOLN
Freq: Once | INTRAVENOUS | Status: AC
  Administered 2012-11-08: 1000 mL via INTRAVENOUS

## 2012-11-08 MED ORDER — ONDANSETRON HCL 4 MG/2ML IJ SOLN: 4.0000 mg | Freq: Four times a day (QID) | INTRAMUSCULAR | Status: DC | PRN

## 2012-11-08 MED ORDER — TRAZODONE HCL 50 MG PO TABS: 25.0000 mg | ORAL_TABLET | Freq: Every evening | ORAL | Status: DC | PRN

## 2012-11-08 MED ORDER — AZITHROMYCIN 500 MG IV SOLR
500.0000 mg | Freq: Once | INTRAVENOUS | Status: AC
  Administered 2012-11-08: 500 mg via INTRAVENOUS
  Filled 2012-11-08: qty 500

## 2012-11-08 MED ORDER — METFORMIN HCL 500 MG PO TABS
1000.0000 mg | ORAL_TABLET | Freq: Every day | ORAL | Status: DC
  Administered 2012-11-09 – 2012-11-13 (×4): 1000 mg via ORAL
  Filled 2012-11-08 (×4): qty 2

## 2012-11-08 MED ORDER — MOMETASONE FURO-FORMOTEROL FUM 100-5 MCG/ACT IN AERO
2.0000 | INHALATION_SPRAY | Freq: Two times a day (BID) | RESPIRATORY_TRACT | Status: DC
  Administered 2012-11-09 – 2012-11-14 (×11): 2 via RESPIRATORY_TRACT
  Filled 2012-11-08: qty 8.8

## 2012-11-08 MED ORDER — DEXTROSE 5 % IV SOLN
1.0000 g | Freq: Once | INTRAVENOUS | Status: AC
  Administered 2012-11-08: 1 g via INTRAVENOUS
  Filled 2012-11-08: qty 10

## 2012-11-08 MED ORDER — LEVALBUTEROL HCL 1.25 MG/0.5ML IN NEBU
1.2500 mg | INHALATION_SOLUTION | Freq: Four times a day (QID) | RESPIRATORY_TRACT | Status: DC | PRN
  Administered 2012-11-09: 1.25 mg via RESPIRATORY_TRACT
  Filled 2012-11-08: qty 0.5

## 2012-11-08 MED ORDER — ONDANSETRON HCL 4 MG PO TABS: 4.0000 mg | ORAL_TABLET | Freq: Four times a day (QID) | ORAL | Status: DC | PRN

## 2012-11-08 MED ORDER — FLEET ENEMA 7-19 GM/118ML RE ENEM: 1.0000 | ENEMA | Freq: Once | RECTAL | Status: AC | PRN

## 2012-11-08 MED ORDER — FUROSEMIDE 10 MG/ML IJ SOLN: 20.0000 mg | Freq: Once | INTRAMUSCULAR | Status: AC

## 2012-11-08 MED ORDER — ROFLUMILAST 500 MCG PO TABS
500.0000 ug | ORAL_TABLET | Freq: Every day | ORAL | Status: DC
  Administered 2012-11-09 – 2012-11-14 (×5): 500 ug via ORAL
  Filled 2012-11-08 (×8): qty 1

## 2012-11-08 MED ORDER — SODIUM CHLORIDE 0.9 % IV SOLN
80.0000 mg | Freq: Once | INTRAVENOUS | Status: AC
  Administered 2012-11-08: 80 mg via INTRAVENOUS
  Filled 2012-11-08: qty 80

## 2012-11-08 MED ORDER — RIVAROXABAN 10 MG PO TABS
15.0000 mg | ORAL_TABLET | Freq: Every day | ORAL | Status: DC
  Administered 2012-11-09 – 2012-11-14 (×5): 15 mg via ORAL
  Filled 2012-11-08 (×2): qty 1
  Filled 2012-11-08 (×4): qty 2

## 2012-11-08 MED ORDER — HYDROMORPHONE HCL PF 1 MG/ML IJ SOLN: 0.5000 mg | INTRAMUSCULAR | Status: DC | PRN

## 2012-11-08 MED ORDER — PANTOPRAZOLE SODIUM 40 MG PO TBEC
40.0000 mg | DELAYED_RELEASE_TABLET | Freq: Every day | ORAL | Status: DC
  Administered 2012-11-09 – 2012-11-14 (×5): 40 mg via ORAL
  Filled 2012-11-08 (×5): qty 1

## 2012-11-08 MED ORDER — IPRATROPIUM BROMIDE 0.02 % IN SOLN
0.5000 mg | Freq: Four times a day (QID) | RESPIRATORY_TRACT | Status: DC | PRN
  Administered 2012-11-09: 0.5 mg via RESPIRATORY_TRACT
  Filled 2012-11-08: qty 2.5

## 2012-11-08 MED ORDER — POLYSACCHARIDE IRON COMPLEX 150 MG PO CAPS
150.0000 mg | ORAL_CAPSULE | Freq: Every day | ORAL | Status: DC
  Administered 2012-11-09 – 2012-11-14 (×5): 150 mg via ORAL
  Filled 2012-11-08 (×5): qty 1

## 2012-11-08 MED ORDER — GLYBURIDE 5 MG PO TABS
5.0000 mg | ORAL_TABLET | Freq: Every day | ORAL | Status: DC
  Administered 2012-11-09: 5 mg via ORAL
  Filled 2012-11-08: qty 1

## 2012-11-08 MED ORDER — SIMVASTATIN 20 MG PO TABS
40.0000 mg | ORAL_TABLET | Freq: Every evening | ORAL | Status: DC
  Administered 2012-11-09 – 2012-11-13 (×5): 40 mg via ORAL
  Filled 2012-11-08: qty 1
  Filled 2012-11-08 (×4): qty 2
  Filled 2012-11-08: qty 1
  Filled 2012-11-08: qty 2

## 2012-11-08 MED ORDER — INSULIN ASPART 100 UNIT/ML ~~LOC~~ SOLN
0.0000 [IU] | Freq: Three times a day (TID) | SUBCUTANEOUS | Status: DC
  Administered 2012-11-09: 1 [IU] via SUBCUTANEOUS
  Administered 2012-11-09: 2 [IU] via SUBCUTANEOUS
  Administered 2012-11-09 – 2012-11-10 (×3): 3 [IU] via SUBCUTANEOUS
  Administered 2012-11-10: 9 [IU] via SUBCUTANEOUS
  Administered 2012-11-11 (×2): 5 [IU] via SUBCUTANEOUS
  Administered 2012-11-12: 9 [IU] via SUBCUTANEOUS

## 2012-11-08 MED ORDER — LORAZEPAM 2 MG/ML IJ SOLN
0.5000 mg | Freq: Once | INTRAMUSCULAR | Status: AC
  Administered 2012-11-08: 0.5 mg via INTRAVENOUS
  Filled 2012-11-08: qty 1

## 2012-11-08 MED ORDER — DILTIAZEM HCL 25 MG/5ML IV SOLN
INTRAVENOUS | Status: AC
  Filled 2012-11-08: qty 5

## 2012-11-08 MED ORDER — ALBUTEROL SULFATE (5 MG/ML) 0.5% IN NEBU
5.0000 mg | INHALATION_SOLUTION | Freq: Once | RESPIRATORY_TRACT | Status: AC
  Administered 2012-11-08: 5 mg via RESPIRATORY_TRACT
  Filled 2012-11-08: qty 1

## 2012-11-08 MED ORDER — INSULIN ASPART 100 UNIT/ML ~~LOC~~ SOLN
0.0000 [IU] | Freq: Every day | SUBCUTANEOUS | Status: DC
  Administered 2012-11-09: 4 [IU] via SUBCUTANEOUS
  Administered 2012-11-10: 2 [IU] via SUBCUTANEOUS

## 2012-11-08 MED ORDER — OXYCODONE HCL 5 MG PO TABS: 5.0000 mg | ORAL_TABLET | ORAL | Status: DC | PRN

## 2012-11-08 MED ORDER — DILTIAZEM HCL 50 MG/10ML IV SOLN
10.0000 mg | Freq: Once | INTRAVENOUS | Status: AC
  Administered 2012-11-08: 10 mg via INTRAVENOUS
  Filled 2012-11-08: qty 2

## 2012-11-08 MED ORDER — SODIUM CHLORIDE 0.9 % IJ SOLN
3.0000 mL | Freq: Two times a day (BID) | INTRAMUSCULAR | Status: DC
  Administered 2012-11-09 – 2012-11-14 (×5): 3 mL via INTRAVENOUS

## 2012-11-08 MED ORDER — DILTIAZEM HCL ER COATED BEADS 180 MG PO CP24
180.0000 mg | ORAL_CAPSULE | Freq: Two times a day (BID) | ORAL | Status: DC
  Administered 2012-11-08 – 2012-11-09 (×2): 180 mg via ORAL
  Filled 2012-11-08 (×3): qty 1

## 2012-11-08 MED ORDER — PANTOPRAZOLE SODIUM 40 MG IV SOLR
INTRAVENOUS | Status: AC
  Filled 2012-11-08: qty 80

## 2012-11-08 MED ORDER — FUROSEMIDE 10 MG/ML IJ SOLN
40.0000 mg | Freq: Once | INTRAMUSCULAR | Status: AC
  Administered 2012-11-08: 40 mg via INTRAVENOUS
  Filled 2012-11-08: qty 4

## 2012-11-08 MED ORDER — IPRATROPIUM BROMIDE 0.02 % IN SOLN
0.5000 mg | Freq: Once | RESPIRATORY_TRACT | Status: AC
  Administered 2012-11-08: 0.5 mg via RESPIRATORY_TRACT
  Filled 2012-11-08: qty 2.5

## 2012-11-08 MED ORDER — FUROSEMIDE 20 MG PO TABS
20.0000 mg | ORAL_TABLET | Freq: Every day | ORAL | Status: DC
  Administered 2012-11-09 – 2012-11-14 (×5): 20 mg via ORAL
  Filled 2012-11-08 (×5): qty 1

## 2012-11-08 MED ORDER — PREDNISONE 50 MG PO TABS
60.0000 mg | ORAL_TABLET | Freq: Once | ORAL | Status: AC
  Administered 2012-11-08: 60 mg via ORAL
  Filled 2012-11-08: qty 1

## 2012-11-08 MED ORDER — FLUTICASONE PROPIONATE 50 MCG/ACT NA SUSP
1.0000 | Freq: Every day | NASAL | Status: DC
  Administered 2012-11-09 – 2012-11-14 (×6): 1 via NASAL
  Filled 2012-11-08: qty 16

## 2012-11-08 MED ORDER — BISACODYL 5 MG PO TBEC
5.0000 mg | DELAYED_RELEASE_TABLET | Freq: Every day | ORAL | Status: DC | PRN
  Administered 2012-11-10: 5 mg via ORAL
  Filled 2012-11-08: qty 1

## 2012-11-08 MED ORDER — TAMSULOSIN HCL 0.4 MG PO CAPS
0.8000 mg | ORAL_CAPSULE | Freq: Every day | ORAL | Status: DC
  Administered 2012-11-09 – 2012-11-13 (×5): 0.8 mg via ORAL
  Filled 2012-11-08 (×6): qty 2

## 2012-11-08 NOTE — ED Notes (Signed)
Attempted to call report to floor. Shanda Bumps, RN to call me back.

## 2012-11-08 NOTE — ED Notes (Signed)
EMS reports pt has copd exacerbation.  Reports pt uses wood and  Oil heat at home and EMS reports was very noticeable in the home.  Pt on home o2 at night and PRN.  Pt was on 2 liters and was sat was 92%.  EMS gave 2 albuterol nebs and 1 atrovent nebulizer.  Pt also afib with RVR.

## 2012-11-08 NOTE — ED Notes (Signed)
Update given to Shanda Bumps, RN on 300.

## 2012-11-08 NOTE — ED Notes (Signed)
Reported to Dr. Fonnie Jarvis that patient's heart rate was 129-137 at this time and not coming down since he returned from Ct. New orders given.

## 2012-11-08 NOTE — ED Notes (Signed)
Patient's heart rate 109-116 at this time.

## 2012-11-08 NOTE — ED Provider Notes (Signed)
History   This chart was scribed for Brendan Horn, MD, by Frederik Pear, ER scribe. The patient was seen in room APA18/[facility]18 and the patient's care was started at 1740.    CSN: [id number]  Arrival date & time 11/08/12  1720   First MD Initiated Contact with Patient 11/08/12 1740      Chief Complaint  Patient presents with  . Shortness of Breath    (Consider location/radiation/quality/duration/timing/severity/associated sxs/prior treatment) HPI Comments: Brendan Hall is a 78 y.o. male brought by EMS with a h/o of chronic SOB that is baseline with 20 ft of walking who presents to the Emergency Department complaining of worsened SOB that is now present with less than 5 feet of walking that began a few days ago. He states that he has a nebulizer, 2 inhalers, and is on 3L of O2 at home for his COPD. In ED, he is baseline on 3 L of O2. He reports that he saw [name] earlier this week and is having a procedure performed this week to fix the atrial flutter.He reports that his last dose of prednisone was more than 1 month ago. He denies any associated synscope, fever, hematochezia, chest pain, or hallucinations.     PCP is Dr. Virgina Organ in [place].   Past Medical History  Diagnosis Date  . Arteriosclerotic cardiovascular disease (ASCVD)     Myocardial infarction in 1980; chronic dyspnea on exertion; 04/2012-normal EF by echo  . Hyperlipidemia   . Hypertension   . Diabetes mellitus, type II   . Arthritis   . COPD (chronic obstructive pulmonary disease)     Home oxygen  . Colon polyps 08/2011    Per colonoscopy, Dr. Karilyn Cota.  Marland Kitchen BPH (benign prostatic hyperplasia)   . Urinary retention 11/15/2011  . Renal mass, right 11/16/2011  . Iron deficiency anemia 11/14/2011  . Atrial flutter with rapid ventricular response 04/29/2012  . Lung cancer   . Chronic respiratory failure with hypoxia 11/09/2012  . Pulmonary nodules 11/09/2012    Past Surgical History  Procedure Date  . Soft  tissue cyst excision     Face  . Colonoscopy 08/28/2011    Procedure: COLONOSCOPY;  Surgeon: Malissa Hippo, MD;  Location: [facility];  Service: Endoscopy;  Laterality: N/A;    Family History  Problem Relation Age of Onset  . Cancer Mother   . Cancer Sister   . Cancer Brother     History  Substance Use Topics  . Smoking status: Former Smoker -- 1.5 packs/day for 60 years    Types: Cigarettes    Quit date: 08/06/2008  . Smokeless tobacco: Never Used  . Alcohol Use: No     Comment: occasional       Review of Systems A complete 10 system review of systems was obtained and all systems are negative except as noted in the HPI and PMH.   Allergies  Review of patient's allergies indicates no known allergies.  Home Medications   No current outpatient prescriptions on file.  BP 110/69  Pulse 130  Temp 97.5 F (36.4 C) (Oral)  Resp 20  Ht 5\' 8"  (1.727 m)  Wt 156 lb 8.4 oz (71 kg)  BMI 23.80 kg/m2  SpO2 97%  Physical Exam  Nursing note and vitals reviewed. Constitutional:       Awake, alert, nontoxic appearance.  HENT:  Head: Atraumatic.  Eyes: Right eye exhibits no discharge. Left eye exhibits no discharge.  Neck: Neck supple.  Cardiovascular: Normal  rate and normal heart sounds.  An irregular rhythm present.  No murmur heard. Pulmonary/Chest: Effort normal. He exhibits no tenderness.       He mild diffuse expiratory wheezes with no crackles or rales.  Abdominal: Soft. There is no tenderness. There is no rebound.  Musculoskeletal: He exhibits edema. He exhibits no tenderness.       He has baseline trace edema  Neurological:       Mental status and motor strength appears baseline for patient and situation.  Skin: No rash noted.  Psychiatric: He has a normal mood and affect.    ED Course  Procedures (including critical care time) ECG: Atrial flutter with variable A-V conduction with ventricular rate 96, normal axis, no acute ischemic changes noted, no  significant change noted compared with October 2013   DIAGNOSTIC STUDIES: Oxygen Saturation is 95% on 4 L of O2, adequate by my interpretation.    COORDINATION OF CARE:  17:47- Patient and family understand and agree with initial ED impression and plan with expectations set for ED visit.  19:34- His hemoglobin is 7.9 and he is fluctuating between mild-moderate anemia but not below 8 per records. He got moderately SOB turning in the bed for the rectal exam. He has brown but heme positive stool.  Patient / Family / Caregiver informed of clinical course, understand medical decision-making process, and agree with plan. D/w IM for admit.  Results for orders placed during the hospital encounter of 11/08/12  CBC WITH DIFFERENTIAL      Component Value Range   WBC 6.4  4.0 - 10.5 K/uL   RBC 3.81 (*) 4.22 - 5.81 MIL/uL   Hemoglobin 7.9 (*) 13.0 - 17.0 g/dL   HCT 32.4 (*) 40.1 - 02.7 %   MCV 73.2 (*) 78.0 - 100.0 fL   MCH 20.7 (*) 26.0 - 34.0 pg   MCHC 28.3 (*) 30.0 - 36.0 g/dL   RDW 25.3 (*) 66.4 - 40.3 %   Platelets 316  150 - 400 K/uL   Neutrophils Relative 68  43 - 77 %   Neutro Abs 4.3  1.7 - 7.7 K/uL   Lymphocytes Relative 17  12 - 46 %   Lymphs Abs 1.1  0.7 - 4.0 K/uL   Monocytes Relative 14 (*) 3 - 12 %   Monocytes Absolute 0.9  0.1 - 1.0 K/uL   Eosinophils Relative 1  0 - 5 %   Eosinophils Absolute 0.1  0.0 - 0.7 K/uL   Basophils Relative 0  0 - 1 %   Basophils Absolute 0.0  0.0 - 0.1 K/uL  BASIC METABOLIC PANEL      Component Value Range   Sodium 141  135 - 145 mEq/L   Potassium 4.3  3.5 - 5.1 mEq/L   Chloride 104  96 - 112 mEq/L   CO2 27  19 - 32 mEq/L   Glucose, Bld 111 (*) 70 - 99 mg/dL   BUN 22  6 - 23 mg/dL   Creatinine, Ser 4.74 (*) 0.50 - 1.35 mg/dL   Calcium 25.9  8.4 - 56.3 mg/dL   GFR calc non Af Amer 44 (*) >90 mL/min   GFR calc Af Amer 51 (*) >90 mL/min  TROPONIN I      Component Value Range   Troponin I <0.30  <0.30 ng/mL  TYPE AND SCREEN      Component  Value Range   ABO/RH(D) B POS     Antibody Screen NEG  Sample Expiration 11/11/2012     Unit Number [id number]     Blood Component Type RED CELLS,LR     Unit division 00     Status of Unit ISSUED,FINAL     Transfusion Status OK TO TRANSFUSE     Crossmatch Result Compatible     Unit Number [id number]     Blood Component Type RED CELLS,LR     Unit division 00     Status of Unit ISSUED     Transfusion Status OK TO TRANSFUSE     Crossmatch Result Compatible    VITAMIN B12      Component Value Range   Vitamin B-12 1148 (*) 211 - 911 pg/mL  FOLATE      Component Value Range   Folate 16.9    IRON AND TIBC      Component Value Range   Iron 19 (*) 42 - 135 ug/dL   TIBC 440 (*) 102 - 725 ug/dL   Saturation Ratios 4 (*) 20 - 55 %   UIBC 431 (*) 125 - 400 ug/dL  FERRITIN      Component Value Range   Ferritin 14 (*) 22 - 322 ng/mL  RETICULOCYTES      Component Value Range   Retic Ct Pct 1.2  0.4 - 3.1 %   RBC. 3.77 (*) 4.22 - 5.81 MIL/uL   Retic Count, Manual 45.2  19.0 - 186.0 K/uL  PRO B NATRIURETIC PEPTIDE      Component Value Range   Pro B Natriuretic peptide (BNP) 993.1 (*) 0 - 450 pg/mL  HEPATIC FUNCTION PANEL      Component Value Range   Total Protein 7.6  6.0 - 8.3 g/dL   Albumin 3.4 (*) 3.5 - 5.2 g/dL   AST 16  0 - 37 U/L   ALT 14  0 - 53 U/L   Alkaline Phosphatase 68  39 - 117 U/L   Total Bilirubin 0.3  0.3 - 1.2 mg/dL   Bilirubin, Direct 0.1  0.0 - 0.3 mg/dL   Indirect Bilirubin 0.2 (*) 0.3 - 0.9 mg/dL  MAGNESIUM      Component Value Range   Magnesium 1.8  1.5 - 2.5 mg/dL  TSH      Component Value Range   TSH 1.635  0.350 - 4.500 uIU/mL  URINALYSIS, ROUTINE W REFLEX MICROSCOPIC      Component Value Range   Color, Urine YELLOW  YELLOW   APPearance CLEAR  CLEAR   Specific Gravity, Urine 1.020  1.005 - 1.030   pH 5.5  5.0 - 8.0   Glucose, UA 100 (*) NEGATIVE mg/dL   Hgb urine dipstick SMALL (*) NEGATIVE   Bilirubin Urine NEGATIVE  NEGATIVE    Ketones, ur NEGATIVE  NEGATIVE mg/dL   Protein, ur NEGATIVE  NEGATIVE mg/dL   Urobilinogen, UA 0.2  0.0 - 1.0 mg/dL   Nitrite NEGATIVE  NEGATIVE   Leukocytes, UA NEGATIVE  NEGATIVE  BASIC METABOLIC PANEL      Component Value Range   Sodium 138  135 - 145 mEq/L   Potassium 5.1  3.5 - 5.1 mEq/L   Chloride 102  96 - 112 mEq/L   CO2 28  19 - 32 mEq/L   Glucose, Bld 326 (*) 70 - 99 mg/dL   BUN 23  6 - 23 mg/dL   Creatinine, Ser 3.66 (*) 0.50 - 1.35 mg/dL   Calcium 9.7  8.4 - 44.0 mg/dL   GFR calc non Af Amer 43 (*) >  90 mL/min   GFR calc Af Amer 49 (*) >90 mL/min  CBC      Component Value Range   WBC 4.7  4.0 - 10.5 K/uL   RBC 4.65  4.22 - 5.81 MIL/uL   Hemoglobin 10.3 (*) 13.0 - 17.0 g/dL   HCT 62.9 (*) 52.8 - 41.3 %   MCV 76.1 (*) 78.0 - 100.0 fL   MCH 22.2 (*) 26.0 - 34.0 pg   MCHC 29.1 (*) 30.0 - 36.0 g/dL   RDW 24.4 (*) 01.0 - 27.2 %   Platelets 335  150 - 400 K/uL  ABO/RH      Component Value Range   ABO/RH(D) B POS    PREPARE RBC (CROSSMATCH)      Component Value Range   Order Confirmation ORDER PROCESSED BY BLOOD BANK    HEMOGLOBIN A1C      Component Value Range   Hemoglobin A1C 7.1 (*) <5.7 %   Mean Plasma Glucose 157 (*) <117 mg/dL  URINE MICROSCOPIC-ADD ON      Component Value Range   Squamous Epithelial / LPF RARE  RARE   RBC / HPF 11-20  <3 RBC/hpf   Bacteria, UA RARE  RARE  GLUCOSE, CAPILLARY      Component Value Range   Glucose-Capillary 247 (*) 70 - 99 mg/dL   Comment 1 Documented in Chart     Comment 2 Notify RN    BASIC METABOLIC PANEL      Component Value Range   Sodium 142  135 - 145 mEq/L   Potassium 4.8  3.5 - 5.1 mEq/L   Chloride 104  96 - 112 mEq/L   CO2 26  19 - 32 mEq/L   Glucose, Bld 164 (*) 70 - 99 mg/dL   BUN 26 (*) 6 - 23 mg/dL   Creatinine, Ser 5.36 (*) 0.50 - 1.35 mg/dL   Calcium 64.4  8.4 - 03.4 mg/dL   GFR calc non Af Amer 40 (*) >90 mL/min   GFR calc Af Amer 46 (*) >90 mL/min  CBC      Component Value Range   WBC 8.0  4.0 -  10.5 K/uL   RBC 4.60  4.22 - 5.81 MIL/uL   Hemoglobin 10.3 (*) 13.0 - 17.0 g/dL   HCT 74.2 (*) 59.5 - 63.8 %   MCV 76.1 (*) 78.0 - 100.0 fL   MCH 22.4 (*) 26.0 - 34.0 pg   MCHC 29.4 (*) 30.0 - 36.0 g/dL   RDW 75.6 (*) 43.3 - 29.5 %   Platelets 359  150 - 400 K/uL  GLUCOSE, CAPILLARY      Component Value Range   Glucose-Capillary 241 (*) 70 - 99 mg/dL   Comment 1 Documented in Chart     Comment 2 Notify RN    GLUCOSE, CAPILLARY      Component Value Range   Glucose-Capillary 192 (*) 70 - 99 mg/dL   Comment 1 Documented in Chart     Comment 2 Notify RN    GLUCOSE, CAPILLARY      Component Value Range   Glucose-Capillary 350 (*) 70 - 99 mg/dL   Comment 1 Notify RN       Labs Reviewed  CBC WITH DIFFERENTIAL - Abnormal; Notable for the following:    RBC 3.81 (*)     Hemoglobin 7.9 (*)     HCT 27.9 (*)     MCV 73.2 (*)     MCH 20.7 (*)  MCHC 28.3 (*)     RDW 18.2 (*)     Monocytes Relative 14 (*)     All other components within normal limits  BASIC METABOLIC PANEL - Abnormal; Notable for the following:    Glucose, Bld 111 (*)     Creatinine, Ser 1.46 (*)     GFR calc non Af Amer 44 (*)     GFR calc Af Amer 51 (*)     All other components within normal limits  VITAMIN B12 - Abnormal; Notable for the following:    Vitamin B-12 1148 (*)     All other components within normal limits  IRON AND TIBC - Abnormal; Notable for the following:    Iron 19 (*)     TIBC 450 (*)     Saturation Ratios 4 (*)     UIBC 431 (*)     All other components within normal limits  FERRITIN - Abnormal; Notable for the following:    Ferritin 14 (*)     All other components within normal limits  RETICULOCYTES - Abnormal; Notable for the following:    RBC. 3.77 (*)     All other components within normal limits  PRO B NATRIURETIC PEPTIDE - Abnormal; Notable for the following:    Pro B Natriuretic peptide (BNP) 993.1 (*)     All other components within normal limits  HEPATIC FUNCTION PANEL -  Abnormal; Notable for the following:    Albumin 3.4 (*)     Indirect Bilirubin 0.2 (*)     All other components within normal limits  URINALYSIS, ROUTINE W REFLEX MICROSCOPIC - Abnormal; Notable for the following:    Glucose, UA 100 (*)     Hgb urine dipstick SMALL (*)     All other components within normal limits  BASIC METABOLIC PANEL - Abnormal; Notable for the following:    Glucose, Bld 326 (*)     Creatinine, Ser 1.49 (*)     GFR calc non Af Amer 43 (*)     GFR calc Af Amer 49 (*)     All other components within normal limits  CBC - Abnormal; Notable for the following:    Hemoglobin 10.3 (*)  DELTA CHECK NOTED   HCT 35.4 (*)     MCV 76.1 (*)     MCH 22.2 (*)     MCHC 29.1 (*)     RDW 18.4 (*)     All other components within normal limits  HEMOGLOBIN A1C - Abnormal; Notable for the following:    Hemoglobin A1C 7.1 (*)     Mean Plasma Glucose 157 (*)     All other components within normal limits  GLUCOSE, CAPILLARY - Abnormal; Notable for the following:    Glucose-Capillary 247 (*)     All other components within normal limits  BASIC METABOLIC PANEL - Abnormal; Notable for the following:    Glucose, Bld 164 (*)     BUN 26 (*)     Creatinine, Ser 1.58 (*)     GFR calc non Af Amer 40 (*)     GFR calc Af Amer 46 (*)     All other components within normal limits  CBC - Abnormal; Notable for the following:    Hemoglobin 10.3 (*)     HCT 35.0 (*)     MCV 76.1 (*)     MCH 22.4 (*)     MCHC 29.4 (*)     RDW 18.1 (*)  All other components within normal limits  GLUCOSE, CAPILLARY - Abnormal; Notable for the following:    Glucose-Capillary 241 (*)     All other components within normal limits  GLUCOSE, CAPILLARY - Abnormal; Notable for the following:    Glucose-Capillary 192 (*)     All other components within normal limits  GLUCOSE, CAPILLARY - Abnormal; Notable for the following:    Glucose-Capillary 350 (*)     All other components within normal limits  TROPONIN I   TYPE AND SCREEN  FOLATE  MAGNESIUM  TSH  ABO/RH  PREPARE RBC (CROSSMATCH)  URINE MICROSCOPIC-ADD ON  BASIC METABOLIC PANEL  CBC  PROTIME-INR  APTT   Ct Chest Wo Contrast  11/08/2012  *RADIOLOGY REPORT*  Clinical Data: Shortness of breath.  Atrial fibrillation, lung cancer.  CT CHEST WITHOUT CONTRAST  Technique:  Multidetector CT imaging of the chest was performed following the standard protocol without IV contrast.  Comparison: 11/08/2012 radiograph, 08/28/2012 CT  Findings: Post treatment changes involving the right chest. Emphysematous change.  Small right pleural effusion is similar to prior.  Aortic ascending ectasia, tapers to a normal caliber. Scattered atherosclerotic calcification.  Further vascular evaluation not possible without intravenous contrast.  Heart size upper normal.  Coronary artery calcification.  No pericardial effusion.  Right hilar soft tissue attenuation/radiation changes are similar to prior.  No new hilar process or adenopathy.  Limited images through the upper abdomen are degraded by respiratory motion.  There is a probable cyst exophytic from the upper posterior right kidney.  Narrowed right upper lobe bronchi are similar to prior.  Otherwise, central airways are patent.  There is bilateral sub centimeter nodularity within the lower lobes that has developed in the interval. This is superimposed on left greater right posterior lung base opacities, similar to mildly more prominent in the interval.  Motion degrades osseous evaluation.  No definite acute osseous finding.  IMPRESSION: Post-treatment changes involving the right lung are similar to recent prior.  There is a new bilateral lower lobe nodularity which can be infectious, aspiration, or less likely (though not excluded) metastatic disease.  Recommend short-term chest CT follow-up.   Original Report Authenticated By: Jearld Lesch, M.D.    Dg Chest Portable 1 View  11/08/2012  *RADIOLOGY REPORT*  Clinical Data:  Shortness of breath.  History of lung cancer.  PORTABLE CHEST - 1 VIEW  Comparison: 08/28/2012 and CT scan of 08/28/2012  Findings: There is a new vague area of haziness at the left base laterally which may represent early infiltrate.  Lungs are hyperinflated consistent with emphysema.  Scarring and retraction of the hilar structures on the right.  Scarring at the right lung base and in the right upper lung zone including vague nodularity. Chronic prominence of the pulmonary arteries.  IMPRESSION: New area of faint haziness at the left lung base which may represent an early infiltrate.   Original Report Authenticated By: Francene Boyers, M.D.      1. COPD exacerbation   2. Atrial flutter   3. CAP (community acquired pneumonia)   4. Anemia   5. GI bleed   6. Anemia due to chronic blood loss   7. DM type 2 (diabetes mellitus, type 2)   8. Pulmonary nodules       MDM  The patient appears reasonably stabilized for admission considering the current resources, flow, and capabilities available in the ED at this time, and I doubt any other [facility] requiring further screening and/or treatment in the ED  prior to admission.   I personally performed the services described in this documentation, which was scribed in my presence. The recorded information has been reviewed and is accurate.       Brendan Horn, MD 11/09/12 2236

## 2012-11-08 NOTE — H&P (Signed)
[facility] History and Physical  MYSHAWN CHIRIBOGA  EAV:[id number]  DOB: 22-Nov-1930   DOA: 11/08/2012   PCP:   Colon Branch, MD   Chief Complaint:  Dyspnea on exertion past few days  HPI: Brendan Hall is an 78 y.o. male.   Elderly African American gentleman with a COPD, and atrial flutter anticoagulated, prevents with the above. In fact says she's been progressively short of breath for months but much worse over the past few days.  In the emergency room patient was found to be short of breath and wheezing; he did respond to nebulizers but was noted to be anemic with hemoglobin of 7.9 and had brown Hemoccult positive stool.   Review of the records show that his hemoglobin has dropped 3 points over the past 5 months and he now has a microcytic anemia despite daily iron supplements.  Colonoscopy in October last year revealed small polyps  He has no fever nor leukocytosis; he has not been taking antibiotics. He says he now feels very comfortable and at his baseline while lying down; his family insists he will be very short of breath if he tries to walk.  Patient is scheduled for what sounds like DC cardioversion by Dr. Sharrell Ku on Thursday this week   Rewiew of Systems:   All systems negative except as marked bold or noted in the HPI;  Constitutional: Negative for malaise, fever and chills. ;  Eyes: Negative for eye pain, redness and discharge. ;  ENMT: Negative for ear pain, hoarseness, nasal congestion, sinus pressure and sore throat. ;  Cardiovascular: Negative for chest pain, palpitations, diaphoresis, dyspnea. ;  Respiratory: Negative for , hemoptysis,   Gastrointestinal: Negative for nausea, vomiting, diarrhea, , abdominal pain, melena, blood in stool, hematemesis, jaundice and rectal bleeding. unusual weight loss..   Genitourinary: Negative for frequency, dysuria, incontinence,flank pain and hematuria; Musculoskeletal: Negative for back pain and neck pain.  Negative for swelling and trauma.;  Skin: . Negative for pruritus, rash, abrasions, bruising and skin lesion.; ulcerations Neuro: Negative for headache, lightheadedness and neck stiffness. Negative for weakness, altered level of consciousness , altered mental status, extremity weakness, burning feet, involuntary movement, seizure and syncope.  Psych: negative for anxiety, depression, insomnia, tearfulness, panic attacks, hallucinations, paranoia, suicidal or homicidal ideation    Past Medical History  Diagnosis Date  . Arteriosclerotic cardiovascular disease (ASCVD)     Myocardial infarction in 1980; chronic dyspnea on exertion; 04/2012-normal EF by echo  . Hyperlipidemia   . Hypertension   . Diabetes mellitus, type II   . Arthritis   . COPD (chronic obstructive pulmonary disease)     Home oxygen  . Colon polyps 08/2011    Per colonoscopy, Dr. Karilyn Cota.  Marland Kitchen BPH (benign prostatic hyperplasia)   . Urinary retention 11/15/2011  . Renal mass, right 11/16/2011  . Iron deficiency anemia 11/14/2011  . Atrial flutter with rapid ventricular response 04/29/2012  . Lung cancer     Past Surgical History  Procedure Date  . Soft tissue cyst excision     Face  . Colonoscopy 08/28/2011    Procedure: COLONOSCOPY;  Surgeon: Malissa Hippo, MD;  Location: [facility];  Service: Endoscopy;  Laterality: N/A;    Medications:  HOME MEDS: Prior to Admission medications   Medication Sig Start Date End Date Taking? Authorizing Provider  cyanocobalamin 1000 MCG tablet Take 1,000 mcg by mouth daily. 05/03/12 05/03/13 Yes Elliot Cousin, MD  diltiazem (CARDIZEM CD) 180 MG 24 hr capsule Take  1 capsule (180 mg total) by mouth 2 (two) times daily. New medication for your heart rate and your blood pressure. 07/13/12 07/13/13 Yes Jodelle Gross, NP  Fluticasone-Salmeterol (ADVAIR) 250-50 MCG/DOSE AEPB Inhale 1 puff into the lungs every 12 (twelve) hours.     Yes Historical Provider, MD  furosemide (LASIX) 40 MG  tablet Take 20 mg by mouth daily.   Yes Historical Provider, MD  glyBURIDE (DIABETA) 5 MG tablet Take 5 mg by mouth daily.   Yes Historical Provider, MD  ipratropium-albuterol (DUONEB) 0.5-2.5 (3) MG/3ML SOLN Take 3 mLs by nebulization 4 (four) times daily. 05/03/12  Yes Elliot Cousin, MD  iron polysaccharides (NIFEREX) 150 MG capsule Take 150 mg by mouth daily. Iron supplementation for your anemia. 05/03/12 05/03/13 Yes Elliot Cousin, MD  metFORMIN (GLUCOPHAGE) 1000 MG tablet Take 1,000 mg by mouth daily with breakfast.    Yes Historical Provider, MD  omeprazole (PRILOSEC) 20 MG capsule Take 20 mg by mouth daily.  08/24/12  Yes Historical Provider, MD  Rivaroxaban (XARELTO) 15 MG TABS tablet Take 15 mg by mouth daily. 05/03/12  Yes Elliot Cousin, MD  roflumilast (DALIRESP) 500 MCG TABS tablet Take 500 mcg by mouth daily.   Yes Historical Provider, MD  simvastatin (ZOCOR) 40 MG tablet Take 40 mg by mouth daily.    Yes Historical Provider, MD  Tamsulosin HCl (FLOMAX) 0.4 MG CAPS Take 0.8 mg by mouth daily after supper.   Yes Historical Provider, MD  tiotropium (SPIRIVA) 18 MCG inhalation capsule Place 18 mcg into inhaler and inhale daily.   Yes Historical Provider, MD  albuterol (VENTOLIN HFA) 108 (90 BASE) MCG/ACT inhaler Inhale 2 puffs into the lungs every 6 (six) hours as needed. Shortness of breath    Historical Provider, MD  mometasone (NASONEX) 50 MCG/ACT nasal spray Place 2 sprays into the nose daily.    Historical Provider, MD     Allergies:  No Known Allergies  Social History:   reports that he quit smoking about 4 years ago. His smoking use included Cigarettes. He has a 90 pack-year smoking history. He has never used smokeless tobacco. He reports that he does not drink alcohol or use illicit drugs.  Family History: Family History  Problem Relation Age of Onset  . Cancer Mother   . Cancer Sister   . Cancer Brother      Physical Exam: Filed Vitals:   11/08/12 2000 11/08/12 2115  11/08/12 2124 11/08/12 2130  BP: 124/78 134/93    Pulse: 86  128   Temp:      TempSrc:      Resp: 30 28  36  SpO2: 99% 93%  96%   Blood pressure 134/93, pulse 128, temperature 99.5 F (37.5 C), temperature source Oral, resp. rate 36, SpO2 96.00%.  GEN:  Pleasant elderly African American gentleman lying in the stretcher in no acute distress; cooperative with exam PSYCH:  alert and oriented x4; does not appear anxious or depressed; affect is appropriate. HEENT: Mucous membranes pink and anicteric; PERRLA; EOM intact; no cervical lymphadenopathy nor thyromegaly or carotid bruit; no JVD; Breasts:: Not examined CHEST WALL: No tenderness CHEST: Tachypneic , rhonchi in the right lower chest HEART: Tachycardic irregular rhythm; no murmurs rubs or gallops BACK: kyphosis and scoliosis; no CVA tenderness ABDOMEN: soft non-tender; no masses, no organomegaly, normal abdominal bowel sounds; no pannus; no intertriginous candida. Rectal Exam: Not done EXTREMITIES:  age-appropriate arthropathy of the hands and knees;  no ulcerations. Genitalia: not examined PULSES: 2+  and symmetric SKIN: Normal hydration no rash or ulceration CNS: Cranial nerves 2-12 grossly intact no focal lateralizing neurologic deficit   Labs on Admission:  Basic Metabolic Panel:  Lab 11/08/12 1610  NA 141  K 4.3  CL 104  CO2 27  GLUCOSE 111*  BUN 22  CREATININE 1.46*  CALCIUM 10.0  MG --  PHOS --   Liver Function Tests: No results found for this basename: AST:5,ALT:5,ALKPHOS:5,BILITOT:5,PROT:5,ALBUMIN:5 in the last 168 hours No results found for this basename: LIPASE:5,AMYLASE:5 in the last 168 hours No results found for this basename: AMMONIA:5 in the last 168 hours CBC:  Lab 11/08/12 1732  WBC 6.4  NEUTROABS 4.3  HGB 7.9*  HCT 27.9*  MCV 73.2*  PLT 316   Cardiac Enzymes:  Lab 11/08/12 1732  CKTOTAL --  CKMB --  CKMBINDEX --  TROPONINI <0.30   BNP: No components found with this basename:  POCBNP:5 D-dimer: No components found with this basename: D-DIMER:5 CBG: No results found for this basename: GLUCAP:5 in the last 168 hours  Radiological Exams on Admission: Ct Chest Wo Contrast  11/08/2012  *RADIOLOGY REPORT*  Clinical Data: Shortness of breath.  Atrial fibrillation, lung cancer.  CT CHEST WITHOUT CONTRAST  Technique:  Multidetector CT imaging of the chest was performed following the standard protocol without IV contrast.  Comparison: 11/08/2012 radiograph, 08/28/2012 CT  Findings: Post treatment changes involving the right chest. Emphysematous change.  Small right pleural effusion is similar to prior.  Aortic ascending ectasia, tapers to a normal caliber. Scattered atherosclerotic calcification.  Further vascular evaluation not possible without intravenous contrast.  Heart size upper normal.  Coronary artery calcification.  No pericardial effusion.  Right hilar soft tissue attenuation/radiation changes are similar to prior.  No new hilar process or adenopathy.  Limited images through the upper abdomen are degraded by respiratory motion.  There is a probable cyst exophytic from the upper posterior right kidney.  Narrowed right upper lobe bronchi are similar to prior.  Otherwise, central airways are patent.  There is bilateral sub centimeter nodularity within the lower lobes that has developed in the interval. This is superimposed on left greater right posterior lung base opacities, similar to mildly more prominent in the interval.  Motion degrades osseous evaluation.  No definite acute osseous finding.  IMPRESSION: Post-treatment changes involving the right lung are similar to recent prior.  There is a new bilateral lower lobe nodularity which can be infectious, aspiration, or less likely (though not excluded) metastatic disease.  Recommend short-term chest CT follow-up.   Original Report Authenticated By: Jearld Lesch, M.D.    Dg Chest Portable 1 View  11/08/2012  *RADIOLOGY  REPORT*  Clinical Data: Shortness of breath.  History of lung cancer.  PORTABLE CHEST - 1 VIEW  Comparison: 08/28/2012 and CT scan of 08/28/2012  Findings: There is a new vague area of haziness at the left base laterally which may represent early infiltrate.  Lungs are hyperinflated consistent with emphysema.  Scarring and retraction of the hilar structures on the right.  Scarring at the right lung base and in the right upper lung zone including vague nodularity. Chronic prominence of the pulmonary arteries.  IMPRESSION: New area of faint haziness at the left lung base which may represent an early infiltrate.   Original Report Authenticated By: Francene Boyers, M.D.     EKG: Independently reviewed. EKG shows atrial flutter with controlled rate at that time EKG was done   Assessment/Plan Present on Admission:  Shortness of  breath, likely multifactorial , COPD plus symptomatic anemia , plus diastolic heart failure in the presence of atrial flutter with RVR  . COPD with exacerbation . Iron deficiency anemia . DM type 2 (diabetes mellitus, type 2) . CKD (chronic kidney disease), stage III . Anemia due to chronic blood loss, causing  fluid overload and CHF type symptoms   PLAN: Continue nebulization; we'll hold antibiotics for now until reviewed by a pulmonologist; Give a dose of Lasix and transfused 2 units of packed red cells after anemia panel drawn; Will not increase his rate controlling medications onto her reevaluation after anemia has improved with transfusions  Consider GI evaluation, before or after DC cardioversion. He will need to continue to be maintained on anti-coagulation for a time, even if cardioversion is successful. .     Code Status: FULL CODE  Family Communication: Wife and daughter at bedside for interview and evaluation; and discussed Disposition Plan: Depending on response to therapy    Ritesh Opara Nocturnist [facility] Pager [phone]   11/08/2012,  10:24 PM

## 2012-11-09 ENCOUNTER — Encounter (HOSPITAL_COMMUNITY): Payer: Self-pay | Admitting: Internal Medicine

## 2012-11-09 DIAGNOSIS — J9611 Chronic respiratory failure with hypoxia: Secondary | ICD-10-CM | POA: Diagnosis present

## 2012-11-09 DIAGNOSIS — R918 Other nonspecific abnormal finding of lung field: Secondary | ICD-10-CM | POA: Diagnosis present

## 2012-11-09 DIAGNOSIS — I4892 Unspecified atrial flutter: Secondary | ICD-10-CM

## 2012-11-09 DIAGNOSIS — R0602 Shortness of breath: Secondary | ICD-10-CM

## 2012-11-09 DIAGNOSIS — J189 Pneumonia, unspecified organism: Secondary | ICD-10-CM | POA: Diagnosis present

## 2012-11-09 DIAGNOSIS — D649 Anemia, unspecified: Secondary | ICD-10-CM

## 2012-11-09 LAB — GLUCOSE, CAPILLARY
Glucose-Capillary: 247 mg/dL — ABNORMAL HIGH (ref 70–99)
Glucose-Capillary: 241 mg/dL — ABNORMAL HIGH (ref 70–99)

## 2012-11-09 LAB — CBC
Hemoglobin: 10.3 g/dL — ABNORMAL LOW (ref 13.0–17.0)
Hemoglobin: 10.3 g/dL — ABNORMAL LOW (ref 13.0–17.0)
MCH: 22.2 pg — ABNORMAL LOW (ref 26.0–34.0)
MCV: 76.1 fL — ABNORMAL LOW (ref 78.0–100.0)
RBC: 4.65 MIL/uL (ref 4.22–5.81)
RBC: 4.6 MIL/uL (ref 4.22–5.81)
WBC: 8 10*3/uL (ref 4.0–10.5)

## 2012-11-09 LAB — BASIC METABOLIC PANEL
CO2: 28 mEq/L (ref 19–32)
GFR calc Af Amer: 46 mL/min — ABNORMAL LOW (ref >90)
GFR calc non Af Amer: 43 mL/min — ABNORMAL LOW (ref >90)
GFR calc non Af Amer: 40 mL/min — ABNORMAL LOW (ref >90)
Glucose, Bld: 326 mg/dL — ABNORMAL HIGH (ref 70–99)
Potassium: 5.1 mEq/L (ref 3.5–5.1)
Potassium: 4.8 mEq/L (ref 3.5–5.1)
Sodium: 138 mEq/L (ref 135–145)
Sodium: 142 mEq/L (ref 135–145)

## 2012-11-09 LAB — URINALYSIS, ROUTINE W REFLEX MICROSCOPIC
Glucose, UA: 100 mg/dL — AB
Specific Gravity, Urine: 1.02 (ref 1.005–1.030)
pH: 5.5 (ref 5.0–8.0)

## 2012-11-09 LAB — IRON AND TIBC
Iron: 19 ug/dL — ABNORMAL LOW (ref 42–135)
TIBC: 450 ug/dL — ABNORMAL HIGH (ref 215–435)

## 2012-11-09 LAB — FERRITIN: Ferritin: 14 ng/mL — ABNORMAL LOW (ref 22–322)

## 2012-11-09 LAB — FOLATE: Folate: 16.9 ng/mL

## 2012-11-09 LAB — HEMOGLOBIN A1C: Mean Plasma Glucose: 157 mg/dL — ABNORMAL HIGH (ref <117)

## 2012-11-09 LAB — VITAMIN B12: Vitamin B-12: 1148 pg/mL — ABNORMAL HIGH (ref 211–911)

## 2012-11-09 LAB — ABO/RH: ABO/RH(D): B POS

## 2012-11-09 MED ORDER — IPRATROPIUM BROMIDE 0.02 % IN SOLN
0.5000 mg | Freq: Four times a day (QID) | RESPIRATORY_TRACT | Status: DC
  Administered 2012-11-09 – 2012-11-14 (×21): 0.5 mg via RESPIRATORY_TRACT
  Filled 2012-11-09 (×22): qty 2.5

## 2012-11-09 MED ORDER — INSULIN GLARGINE 100 UNIT/ML ~~LOC~~ SOLN
10.0000 [IU] | Freq: Two times a day (BID) | SUBCUTANEOUS | Status: DC
  Administered 2012-11-09 – 2012-11-11 (×4): 10 [IU] via SUBCUTANEOUS

## 2012-11-09 MED ORDER — LEVALBUTEROL HCL 1.25 MG/0.5ML IN NEBU
1.2500 mg | INHALATION_SOLUTION | Freq: Four times a day (QID) | RESPIRATORY_TRACT | Status: DC
  Administered 2012-11-09 – 2012-11-14 (×21): 1.25 mg via RESPIRATORY_TRACT
  Filled 2012-11-09 (×22): qty 0.5

## 2012-11-09 MED ORDER — DILTIAZEM HCL 60 MG PO TABS
90.0000 mg | ORAL_TABLET | Freq: Four times a day (QID) | ORAL | Status: DC
  Administered 2012-11-09 – 2012-11-10 (×3): 90 mg via ORAL
  Filled 2012-11-09 (×3): qty 1

## 2012-11-09 MED ORDER — AZITHROMYCIN 500 MG IV SOLR
500.0000 mg | INTRAVENOUS | Status: DC
  Administered 2012-11-09: 500 mg via INTRAVENOUS
  Filled 2012-11-09 (×2): qty 500

## 2012-11-09 MED ORDER — CEFTRIAXONE SODIUM 1 G IJ SOLR
1.0000 g | INTRAMUSCULAR | Status: DC
  Administered 2012-11-09 – 2012-11-13 (×5): 1 g via INTRAVENOUS
  Filled 2012-11-09 (×7): qty 10

## 2012-11-09 MED ORDER — METHYLPREDNISOLONE SODIUM SUCC 125 MG IJ SOLR
60.0000 mg | Freq: Three times a day (TID) | INTRAMUSCULAR | Status: DC
  Administered 2012-11-09 – 2012-11-12 (×9): 60 mg via INTRAVENOUS
  Filled 2012-11-09 (×9): qty 2

## 2012-11-09 NOTE — Care Management Note (Unsigned)
    Page 1 of 1   11/12/2012     2:41:26 PM   CARE MANAGEMENT NOTE 11/12/2012  Patient:  Brendan Hall, Brendan Hall   Account Number:  [id number]  Date Initiated:  11/09/2012  Documentation initiated by:  Rosemary Holms  Subjective/Objective Assessment:   Pt admitted from home where he lives with his wife. Spoke with pt, wife and daughter at bedside. Daughter states that she assists her parents as needed and there were no HH needs identified.     Action/Plan:   Anticipated DC Date:  11/13/2012   Anticipated DC Plan:  HOME W HOME HEALTH SERVICES      DC Planning Services  CM consult      Choice offered to / List presented to:             Status of service:  In process, will continue to follow Medicare Important Message given?  YES (If response is "NO", the following Medicare IM given date fields will be blank) Date Medicare IM given:  11/12/2012 Date Additional Medicare IM given:    Discharge Disposition:    Per UR Regulation:    If discussed at Long Length of Stay Meetings, dates discussed:    Comments:  11/12/12 Rosemary Holms RN BSN CM Previous with [facility]. Currently on O2 at home at 3L.  11/09/12 Waqas Bruhl Leanord Hawking RN BSN CM

## 2012-11-09 NOTE — Consult Note (Signed)
CARDIOLOGY CONSULT NOTE  Patient ID: Brendan Hall MRN: [id number] DOB/AGE: 06-23-1930 78 y.o.  Admit date: 11/08/2012 Referring Physician: Elliot Cousin MD Primary Vernetta Honey, MD Primary Cardiologist: Dr. Nona Dell Primary Electrophysiologist: Sharrell Ku MD Reason for Consultation:Atrial Flutter with RVR.  Principal Problem:  *COPD with exacerbation Active Problems:  Iron deficiency anemia  DM type 2 (diabetes mellitus, type 2)  History of lung cancer  Atrial flutter  CKD (chronic kidney disease), stage III  Anemia due to chronic blood loss  Chronic respiratory failure with hypoxia  Pulmonary nodules  Pneumonia  HPI: Brendan Hall is a 78 year old patient well-known to our practice that we follow for the long-standing history of atrial flutter, on Xarleto for anticoagulation,hypertension, with history of COPD which is oxygen dependent,, history of myocardial infarction in 1981, lung cancer, and chronic iron deficiency anemia. The patient has been seen recently by Dr. Sharrell Ku on 11/02/2012 for discussion of atrial flutter ablation in the setting of recurrent and progressive dyspnea on exertion and difficult to control heart rate. He has been scheduled to have a atrial flutter ablation on Thursday, December 19th.    Brendan Hall presented to the emergency room on 11/08/2002 with progressively worsening shortness of breath. He was found to be anemic with a hemoglobin of 7.9 and Hemoccult-positive stool. He also is complaining of wheezing but did respond to nebulizer treatments. He was also found to be in atrial flutter with RVR with 144 beats per minute with frequent PVCs. He was given IV Cardizem 10 mg x1 and return to by mouth Cardizem 180 mg daily as he was taking at home. He was given transfusion of 2 units of packed red blood cells, with improvement of hemoglobin to 10.3 with hematocrit of 35.4., he was CHF,also treated with Lasix in the setting of mild  CHF with a pro-BNP of 993.1. He remains on Xarelto 15 mg daily. We are asked for further cardiac recommendations in this setting.   Review of systems complete and found to be negative unless listed above   Past Medical History  Diagnosis Date  . Arteriosclerotic cardiovascular disease (ASCVD)     Myocardial infarction in 1980; chronic dyspnea on exertion; 04/2012-normal EF by echo  . Hyperlipidemia   . Hypertension   . Diabetes mellitus, type II   . Arthritis   . COPD (chronic obstructive pulmonary disease)     Home oxygen  . Colon polyps 08/2011    Per colonoscopy, Dr. Karilyn Cota.  Marland Kitchen BPH (benign prostatic hyperplasia)   . Urinary retention 11/15/2011  . Renal mass, right 11/16/2011  . Iron deficiency anemia 11/14/2011  . Atrial flutter with rapid ventricular response 04/29/2012  . Lung cancer   . Chronic respiratory failure with hypoxia 11/09/2012  . Pulmonary nodules 11/09/2012    Family History  Problem Relation Age of Onset  . Cancer Mother   . Cancer Sister   . Cancer Brother     History   Social History  . Marital Status: Married    Spouse Name: N/A    Number of Children: N/A  . Years of Education: N/A   Occupational History  . Not on file.   Social History Main Topics  . Smoking status: Former Smoker -- 1.5 packs/day for 60 years    Types: Cigarettes    Quit date: 08/06/2008  . Smokeless tobacco: Never Used  . Alcohol Use: No     Comment: occasional   . Drug Use: No  . Sexually Active: No  Other Topics Concern  . Not on file   Social History Narrative  . No narrative on file    Past Surgical History  Procedure Date  . Soft tissue cyst excision     Face  . Colonoscopy 08/28/2011    Procedure: COLONOSCOPY;  Surgeon: Malissa Hippo, MD;  Location: [facility];  Service: Endoscopy;  Laterality: N/A;    Prescriptions prior to admission  Medication Sig Dispense Refill  . cyanocobalamin 1000 MCG tablet Take 1,000 mcg by mouth daily.      Marland Kitchen diltiazem  (CARDIZEM CD) 180 MG 24 hr capsule Take 1 capsule (180 mg total) by mouth 2 (two) times daily. New medication for your heart rate and your blood pressure.  60 capsule  3  . Fluticasone-Salmeterol (ADVAIR) 250-50 MCG/DOSE AEPB Inhale 1 puff into the lungs every 12 (twelve) hours.        . furosemide (LASIX) 40 MG tablet Take 20 mg by mouth daily.      Marland Kitchen glyBURIDE (DIABETA) 5 MG tablet Take 5 mg by mouth daily.      Marland Kitchen ipratropium-albuterol (DUONEB) 0.5-2.5 (3) MG/3ML SOLN Take 3 mLs by nebulization 4 (four) times daily.      . iron polysaccharides (NIFEREX) 150 MG capsule Take 150 mg by mouth daily. Iron supplementation for your anemia.      . metFORMIN (GLUCOPHAGE) 1000 MG tablet Take 1,000 mg by mouth daily with breakfast.       . omeprazole (PRILOSEC) 20 MG capsule Take 20 mg by mouth daily.       . Rivaroxaban (XARELTO) 15 MG TABS tablet Take 15 mg by mouth daily.      . roflumilast (DALIRESP) 500 MCG TABS tablet Take 500 mcg by mouth daily.      . simvastatin (ZOCOR) 40 MG tablet Take 40 mg by mouth daily.       . Tamsulosin HCl (FLOMAX) 0.4 MG CAPS Take 0.8 mg by mouth daily after supper.      . tiotropium (SPIRIVA) 18 MCG inhalation capsule Place 18 mcg into inhaler and inhale daily.      Marland Kitchen albuterol (VENTOLIN HFA) 108 (90 BASE) MCG/ACT inhaler Inhale 2 puffs into the lungs every 6 (six) hours as needed. Shortness of breath      . mometasone (NASONEX) 50 MCG/ACT nasal spray Place 2 sprays into the nose daily.        Physical Exam: Blood pressure 121/75, pulse 88, temperature 97.6 F (36.4 C), temperature source Oral, resp. rate 28, height 5\' 8"  (1.727 m), weight 156 lb 8.4 oz (71 kg), SpO2 100.00%.  General: Well developed, thin, in mild respiratory distress Head: Eyes PERRLA, No xanthomas.   Normal cephalic and atramatic  Lungs: Bilateral crackles are noted without wheezes. He continues on O2 at 2.L; prolonged expiratory phase; mild expiratory rhonchi Heart: HRIR S1 S2, tachycardic;  irregular rhythm without MRG.  Pulses are 2+ & equal.            No carotid bruit. No JVD.  No abdominal bruits. No femoral bruits. Abdomen: Bowel sounds are positive, abdomen soft and non-tender without masses or                  Hernia's noted. Msk:  Back normal, normal gait. Normal strength and tone for age. Extremities: Positive for clubbing, cyanosis or edema.  DP +1 Neuro: Alert and oriented X 3. Psych:  Good affect, responds appropriately  Labs: Lab Results  Component Value Date  WBC 4.7 11/09/2012   HGB 10.3* 11/09/2012   HCT 35.4* 11/09/2012   MCV 76.1* 11/09/2012   PLT 335 11/09/2012    Lab 11/09/12 0511 11/08/12 1732  NA 138 --  K 5.1 --  CL 102 --  CO2 28 --  BUN 23 --  CREATININE 1.49* --  CALCIUM 9.7 --  PROT -- 7.6  BILITOT -- 0.3  ALKPHOS -- 68  ALT -- 14  AST -- 16  GLUCOSE 326* --   Lab Results  Component Value Date   CKTOTAL 149 04/30/2012   CKMB 4.8* 04/30/2012   TROPONINI <0.30 11/08/2012    Lab Results  Component Value Date   CHOL 106 05/01/2012   Lab Results  Component Value Date   HDL 64 05/01/2012   Lab Results  Component Value Date   LDLCALC 33 05/01/2012   Lab Results  Component Value Date   TRIG 47 05/01/2012   Lab Results  Component Value Date   CHOLHDL 1.7 05/01/2012   No results found for this basename: LDLDIRECT    Radiology: Ct Chest Wo Contrast 11/08/2012  post radiotherapy changes in the right lung; new bilateral lower lobe nodular infiltrative process, most likely representing pneumoniaDg Chest Portable 1 View * 11/08/2012  PORTABLE CHEST: Emphysematous changes; scarring at the right lung base; possible early left basilar infiltrate   [id number]:[id number] Flutter rate of 124 bpm  ASSESSMENT AND PLAN:   1. Atrial Flutter with RVR: Heart rate is much better controlled since he has received blood transfusion, and continued on PO Cardizem. He is breathing much better. I have discussed with MD concerning postponing atrial flutter ablation  for Thursday. Currently no changes in plans to proceed with ablation at this time. He continues on Xarelto. May need to increase dose of rate control medications from 180 mg to 240 mg daily. BP is well controlled presently in the 130's systolic.   2. Iron Deficiency Anemia: He is followed by Dr. Gae Gallop on a monthly basis for this. He was stool heme positive on admission. He is being seen by Dr.Rehman today.  3.COPD: Remains oxygen dependent but breathing status has improved substantially since admission.  ABG in 04/2012 revealed a degree of CO2 retention.  Bettey Mare. Lyman Bishop NP [facility] 11/09/2012, 12:14 PM  Cardiology Attending Patient interviewed and examined. Discussed with Joni Reining, NP.  Above note annotated and modified based upon my findings.  Heart rate control is suboptimal. In the setting of significant COPD, we will attempt to use diltiazem predominantly to decrease ventricular rate. Rivaroxaban has been adjusted for impaired renal function. Coagulation studies will be obtained in an attempt to document drug effect.   Endoscopy can proceed as planned. Based upon the findings, we will determine whether atrial flutter ablation will be appropriate this week.  Windsor Bing, MD 11/09/2012, 6:50 PM

## 2012-11-09 NOTE — Progress Notes (Signed)
Subjective: The patient says that he feels "all right". He is slightly less short of breath. He denies chest pain or chest palpitations. He reports a history of rectal bleeding a few weeks ago.  Objective: Vital signs in last 24 hours: Filed Vitals:   11/09/12 0345 11/09/12 0500 11/09/12 0600 11/09/12 0630  BP: 121/80 121/75    Pulse: 127 126  88  Temp: 97.5 F (36.4 C) 97.6 F (36.4 C)    TempSrc: Oral Oral    Resp: 26 24  28   Height:      Weight:  71 kg (156 lb 8.4 oz)    SpO2:  98% 80% 100%    Intake/Output Summary (Last 24 hours) at 11/09/12 1001 Last data filed at 11/09/12 0800  Gross per 24 hour  Intake 1743.33 ml  Output   1000 ml  Net 743.33 ml    Weight change:     Lab Results: Basic Metabolic Panel:  Basename 11/09/12 0511 11/08/12 1732  NA 138 141  K 5.1 4.3  CL 102 104  CO2 28 27  GLUCOSE 326* 111*  BUN 23 22  CREATININE 1.49* 1.46*  CALCIUM 9.7 10.0  MG -- 1.8  PHOS -- --   Liver Function Tests:  Basename 11/08/12 1732  AST 16  ALT 14  ALKPHOS 68  BILITOT 0.3  PROT 7.6  ALBUMIN 3.4*   No results found for this basename: LIPASE:2,AMYLASE:2 in the last 72 hours No results found for this basename: AMMONIA:2 in the last 72 hours CBC:  Basename 11/09/12 0511 11/08/12 1732  WBC 4.7 6.4  NEUTROABS -- 4.3  HGB 10.3* 7.9*  HCT 35.4* 27.9*  MCV 76.1* 73.2*  PLT 335 316   Cardiac Enzymes:  Basename 11/08/12 1732  CKTOTAL --  CKMB --  CKMBINDEX --  TROPONINI <0.30   BNP:  Basename 11/08/12 1732  PROBNP 993.1*   D-Dimer: No results found for this basename: DDIMER:2 in the last 72 hours CBG:  Basename 11/09/12 0750  GLUCAP 247*   Hemoglobin A1C: No results found for this basename: HGBA1C in the last 72 hours Fasting Lipid Panel: No results found for this basename: CHOL,HDL,LDLCALC,TRIG,CHOLHDL,LDLDIRECT in the last 72 hours Thyroid Function Tests: No results found for this basename: TSH,T4TOTAL,FREET4,T3FREE,THYROIDAB in the  last 72 hours Anemia Panel:  Basename 11/08/12 1732  VITAMINB12 --  FOLATE --  FERRITIN --  TIBC --  IRON --  RETICCTPCT 1.2   Coagulation: No results found for this basename: LABPROT:2,INR:2 in the last 72 hours Urine Drug Screen: Drugs of Abuse  No results found for this basename: labopia,  cocainscrnur,  labbenz,  amphetmu,  thcu,  labbarb    Alcohol Level: No results found for this basename: ETH:2 in the last 72 hours Urinalysis:  Basename 11/09/12 0517  COLORURINE YELLOW  LABSPEC 1.020  PHURINE 5.5  GLUCOSEU 100*  HGBUR SMALL*  BILIRUBINUR NEGATIVE  KETONESUR NEGATIVE  PROTEINUR NEGATIVE  UROBILINOGEN 0.2  NITRITE NEGATIVE  LEUKOCYTESUR NEGATIVE   Misc. Labs:   Micro: No results found for this or any previous visit (from the past 240 hour(s)).  Studies/Results: Ct Chest Wo Contrast  11/08/2012  *RADIOLOGY REPORT*  Clinical Data: Shortness of breath.  Atrial fibrillation, lung cancer.  CT CHEST WITHOUT CONTRAST  Technique:  Multidetector CT imaging of the chest was performed following the standard protocol without IV contrast.  Comparison: 11/08/2012 radiograph, 08/28/2012 CT  Findings: Post treatment changes involving the right chest. Emphysematous change.  Small right pleural effusion is  similar to prior.  Aortic ascending ectasia, tapers to a normal caliber. Scattered atherosclerotic calcification.  Further vascular evaluation not possible without intravenous contrast.  Heart size upper normal.  Coronary artery calcification.  No pericardial effusion.  Right hilar soft tissue attenuation/radiation changes are similar to prior.  No new hilar process or adenopathy.  Limited images through the upper abdomen are degraded by respiratory motion.  There is a probable cyst exophytic from the upper posterior right kidney.  Narrowed right upper lobe bronchi are similar to prior.  Otherwise, central airways are patent.  There is bilateral sub centimeter nodularity within the  lower lobes that has developed in the interval. This is superimposed on left greater right posterior lung base opacities, similar to mildly more prominent in the interval.  Motion degrades osseous evaluation.  No definite acute osseous finding.  IMPRESSION: Post-treatment changes involving the right lung are similar to recent prior.  There is a new bilateral lower lobe nodularity which can be infectious, aspiration, or less likely (though not excluded) metastatic disease.  Recommend short-term chest CT follow-up.   Original Report Authenticated By: Jearld Lesch, M.D.    Dg Chest Portable 1 View  11/08/2012  *RADIOLOGY REPORT*  Clinical Data: Shortness of breath.  History of lung cancer.  PORTABLE CHEST - 1 VIEW  Comparison: 08/28/2012 and CT scan of 08/28/2012  Findings: There is a new vague area of haziness at the left base laterally which may represent early infiltrate.  Lungs are hyperinflated consistent with emphysema.  Scarring and retraction of the hilar structures on the right.  Scarring at the right lung base and in the right upper lung zone including vague nodularity. Chronic prominence of the pulmonary arteries.  IMPRESSION: New area of faint haziness at the left lung base which may represent an early infiltrate.   Original Report Authenticated By: Francene Boyers, M.D.     Medications:  Scheduled:    . diltiazem  180 mg Oral BID  . fluticasone  1 spray Each Nare Daily  . furosemide  20 mg Oral Daily  . glyBURIDE  5 mg Oral Q breakfast  . insulin aspart  0-5 Units Subcutaneous QHS  . insulin aspart  0-9 Units Subcutaneous TID WC  . ipratropium  0.5 mg Nebulization Q6H  . iron polysaccharides  150 mg Oral Daily  . levalbuterol  1.25 mg Nebulization Q6H  . metFORMIN  1,000 mg Oral Q breakfast  . mometasone-formoterol  2 puff Inhalation BID  . pantoprazole  40 mg Oral Daily  . Rivaroxaban  15 mg Oral Daily  . roflumilast  500 mcg Oral Daily  . simvastatin  40 mg Oral QPM  . sodium  chloride  3 mL Intravenous Q12H  . Tamsulosin HCl  0.8 mg Oral QPC supper   Continuous:  ZOX:WRUEAVWUJ, HYDROmorphone (DILAUDID) injection, magnesium hydroxide, ondansetron (ZOFRAN) IV, ondansetron, oxyCODONE, traZODone  Assessment: Principal Problem:  *COPD with exacerbation Active Problems:  Pneumonia  Anemia due to chronic blood loss  Pulmonary nodules  Iron deficiency anemia  DM type 2 (diabetes mellitus, type 2)  History of lung cancer  Atrial flutter  CKD (chronic kidney disease), stage III  Chronic respiratory failure with hypoxia   1. Acute on chronic dyspnea. Etiology likely multifactorial. See below.  Chronic hypoxic respiratory failure secondary to COPD/COPD with exacerbation. We'll continue bronchodilator therapy with Dulera, Xopenex nebulizer, and Atrovent nebulizer. Continue oxygen at 2-3 L a minute. Will restart antibiotics and steroid. Dr. Juanetta Gosling has been consulted.  Pulmonary  nodularity. We'll treat this as community-acquired pneumonia. However, in the setting of history of lung cancer, the patient may need further imaging with a PET scan electively.  Atrial flutter with RVR. His heart rate is better. Continue diltiazem. He is scheduled for what sounds to be DC cardioversion later in the week. He is anticoagulated with the Xarelto. Cardiology has been consulted.  Microcytic, iron deficiency anemia, presumed to be secondary to chronic blood loss; in the setting of chronic anticoagulation. Status post 2 units of packed red blood cell transfusion. The patient has a history of diverticulosis per colonoscopy in October 2012. He is on Protonix empirically. GI has been consulted.  Chronic kidney disease. Currently stable.  Query diastolic dysfunction. Echocardiogram in June 2013 could not evaluate for diastolic dysfunction. The patient was given a total of 60 mg of Lasix following the blood transfusions.    Plan:  1. We'll restart antibiotics and start IV  Solu-Medrol. 2. Will check a followup CBC and basic metabolic panel this afternoon. 3. Await cardiology and gastroenterology consultations. 4. Will discontinue glyburide  to avoid symptomatic hypoglycemia in the setting of decreased by mouth intake and chronic kidney disease.. We will continue metformin and sliding scale NovoLog. Add Lantus. Expect his CBGs to increase on IV steroids.    LOS: 1 day   Dayona Shaheen 11/09/2012, 10:01 AM

## 2012-11-09 NOTE — Plan of Care (Signed)
Problem: Phase II Progression Outcomes Goal: O2 sats > equal to 90% on RA or at baseline Outcome: Progressing Pt was decreased from venti mask to n/c at 4 lpm per n/c

## 2012-11-09 NOTE — Progress Notes (Signed)
Pt breathing 36 times a minute on 4 lpm/Rochelle very sob , placed on partial rebreather after given hhn tx, pts sats 100 but he still is breathing very fast. 28.

## 2012-11-09 NOTE — Consult Note (Signed)
Consult requested by: Dr. Orvan Falconer Consult requested for COPD exacerbation/pneumonia:  HPI: This is an 78 year old who has a history of COPD. He also has a history of lung cancer which is been treated. He came to the emergency because of increasing shortness of breath. He has not had any cough or congestion but has lost weight about 11 pounds. He has not had any hemoptysis abdominal or chest pain. He has a history of chronic atrial flutter. He was found to be anemic and has received 2 units of packed red blood cells since admission  Past Medical History  Diagnosis Date  . Arteriosclerotic cardiovascular disease (ASCVD)     Myocardial infarction in 1980; chronic dyspnea on exertion; 04/2012-normal EF by echo  . Hyperlipidemia   . Hypertension   . Diabetes mellitus, type II   . Arthritis   . COPD (chronic obstructive pulmonary disease)     Home oxygen  . Colon polyps 08/2011    Per colonoscopy, Dr. Karilyn Cota.  Marland Kitchen BPH (benign prostatic hyperplasia)   . Urinary retention 11/15/2011  . Renal mass, right 11/16/2011  . Iron deficiency anemia 11/14/2011  . Atrial flutter with rapid ventricular response 04/29/2012  . Lung cancer      Family History  Problem Relation Age of Onset  . Cancer Mother   . Cancer Sister   . Cancer Brother      History   Social History  . Marital Status: Married    Spouse Name: N/A    Number of Children: N/A  . Years of Education: N/A   Social History Main Topics  . Smoking status: Former Smoker -- 1.5 packs/day for 60 years    Types: Cigarettes    Quit date: 08/06/2008  . Smokeless tobacco: Never Used  . Alcohol Use: No     Comment: occasional   . Drug Use: No  . Sexually Active: No   Other Topics Concern  . None   Social History Narrative  . None     ROS: Except as mentioned is negative    Objective: Vital signs in last 24 hours: Temp:  [97.2 F (36.2 C)-99.5 F (37.5 C)] 97.6 F (36.4 C) (12/16 0500) Pulse Rate:  [44-140] 88  (12/16  0630) Resp:  [16-40] 28  (12/16 0630) BP: (96-134)/(59-93) 121/75 mmHg (12/16 0500) SpO2:  [80 %-100 %] 100 % (12/16 0630) FiO2 (%):  [80 %] 80 % (12/16 0630) Weight:  [70 kg (154 lb 5.2 oz)-71 kg (156 lb 8.4 oz)] 71 kg (156 lb 8.4 oz) (12/16 0500) Weight change:  Last BM Date: 11/08/12  Intake/Output from previous day: 12/15 0701 - 12/16 0700 In: 1503.3 [I.V.:250; Blood:1253.3] Out: 1000 [Urine:1000]  PHYSICAL EXAM He is awake and alert. He does not appear to be in any acute distress. His pupils are reactive. His nose and throat clear. Neck is supple. His chest shows some rhonchi bilaterally. His heart is irregular without gallop. His abdomen is soft without masses.. CNS exam ok  Lab Results: Basic Metabolic Panel:  [facility] 11/09/12 0511 11/08/12 1732  NA 138 141  K 5.1 4.3  CL 102 104  CO2 28 27  GLUCOSE 326* 111*  BUN 23 22  CREATININE 1.49* 1.46*  CALCIUM 9.7 10.0  MG -- 1.8  PHOS -- --   Liver Function Tests:  [facility] 11/08/12 1732  AST 16  ALT 14  ALKPHOS 68  BILITOT 0.3  PROT 7.6  ALBUMIN 3.4*   No results found for this basename: LIPASE:2,AMYLASE:2 in  the last 72 hours No results found for this basename: AMMONIA:2 in the last 72 hours CBC:  Basename 11/09/12 0511 11/08/12 1732  WBC 4.7 6.4  NEUTROABS -- 4.3  HGB 10.3* 7.9*  HCT 35.4* 27.9*  MCV 76.1* 73.2*  PLT 335 316   Cardiac Enzymes:  Basename 11/08/12 1732  CKTOTAL --  CKMB --  CKMBINDEX --  TROPONINI <0.30   BNP:  Basename 11/08/12 1732  PROBNP 993.1*   D-Dimer: No results found for this basename: DDIMER:2 in the last 72 hours CBG:  Basename 11/09/12 0750  GLUCAP 247*   Hemoglobin A1C: No results found for this basename: HGBA1C in the last 72 hours Fasting Lipid Panel: No results found for this basename: CHOL,HDL,LDLCALC,TRIG,CHOLHDL,LDLDIRECT in the last 72 hours Thyroid Function Tests: No results found for this basename: TSH,T4TOTAL,FREET4,T3FREE,THYROIDAB in the last 72  hours Anemia Panel:  Basename 11/08/12 1732  VITAMINB12 --  FOLATE --  FERRITIN --  TIBC --  IRON --  RETICCTPCT 1.2   Coagulation: No results found for this basename: LABPROT:2,INR:2 in the last 72 hours Urine Drug Screen: Drugs of Abuse  No results found for this basename: labopia, cocainscrnur, labbenz, amphetmu, thcu, labbarb    Alcohol Level: No results found for this basename: ETH:2 in the last 72 hours Urinalysis:  Basename 11/09/12 0517  COLORURINE YELLOW  LABSPEC 1.020  PHURINE 5.5  GLUCOSEU 100*  HGBUR SMALL*  BILIRUBINUR NEGATIVE  KETONESUR NEGATIVE  PROTEINUR NEGATIVE  UROBILINOGEN 0.2  NITRITE NEGATIVE  LEUKOCYTESUR NEGATIVE   Misc. Labs:   ABGS: No results found for this basename: PHART,PCO2,PO2ART,TCO2,HCO3 in the last 72 hours   MICROBIOLOGY: No results found for this or any previous visit (from the past 240 hour(s)).  Studies/Results: Ct Chest Wo Contrast  11/08/2012  *RADIOLOGY REPORT*  Clinical Data: Shortness of breath.  Atrial fibrillation, lung cancer.  CT CHEST WITHOUT CONTRAST  Technique:  Multidetector CT imaging of the chest was performed following the standard protocol without IV contrast.  Comparison: 11/08/2012 radiograph, 08/28/2012 CT  Findings: Post treatment changes involving the right chest. Emphysematous change.  Small right pleural effusion is similar to prior.  Aortic ascending ectasia, tapers to a normal caliber. Scattered atherosclerotic calcification.  Further vascular evaluation not possible without intravenous contrast.  Heart size upper normal.  Coronary artery calcification.  No pericardial effusion.  Right hilar soft tissue attenuation/radiation changes are similar to prior.  No new hilar process or adenopathy.  Limited images through the upper abdomen are degraded by respiratory motion.  There is a probable cyst exophytic from the upper posterior right kidney.  Narrowed right upper lobe bronchi are similar to prior.   Otherwise, central airways are patent.  There is bilateral sub centimeter nodularity within the lower lobes that has developed in the interval. This is superimposed on left greater right posterior lung base opacities, similar to mildly more prominent in the interval.  Motion degrades osseous evaluation.  No definite acute osseous finding.  IMPRESSION: Post-treatment changes involving the right lung are similar to recent prior.  There is a new bilateral lower lobe nodularity which can be infectious, aspiration, or less likely (though not excluded) metastatic disease.  Recommend short-term chest CT follow-up.   Original Report Authenticated By: Jearld Lesch, M.D.    Dg Chest Portable 1 View  11/08/2012  *RADIOLOGY REPORT*  Clinical Data: Shortness of breath.  History of lung cancer.  PORTABLE CHEST - 1 VIEW  Comparison: 08/28/2012 and CT scan of 08/28/2012  Findings: There  is a new vague area of haziness at the left base laterally which may represent early infiltrate.  Lungs are hyperinflated consistent with emphysema.  Scarring and retraction of the hilar structures on the right.  Scarring at the right lung base and in the right upper lung zone including vague nodularity. Chronic prominence of the pulmonary arteries.  IMPRESSION: New area of faint haziness at the left lung base which may represent an early infiltrate.   Original Report Authenticated By: Francene Boyers, M.D.     Medications:  Prior to Admission:  Prescriptions prior to admission  Medication Sig Dispense Refill  . cyanocobalamin 1000 MCG tablet Take 1,000 mcg by mouth daily.      Marland Kitchen diltiazem (CARDIZEM CD) 180 MG 24 hr capsule Take 1 capsule (180 mg total) by mouth 2 (two) times daily. New medication for your heart rate and your blood pressure.  60 capsule  3  . Fluticasone-Salmeterol (ADVAIR) 250-50 MCG/DOSE AEPB Inhale 1 puff into the lungs every 12 (twelve) hours.        . furosemide (LASIX) 40 MG tablet Take 20 mg by mouth daily.       Marland Kitchen glyBURIDE (DIABETA) 5 MG tablet Take 5 mg by mouth daily.      Marland Kitchen ipratropium-albuterol (DUONEB) 0.5-2.5 (3) MG/3ML SOLN Take 3 mLs by nebulization 4 (four) times daily.      . iron polysaccharides (NIFEREX) 150 MG capsule Take 150 mg by mouth daily. Iron supplementation for your anemia.      . metFORMIN (GLUCOPHAGE) 1000 MG tablet Take 1,000 mg by mouth daily with breakfast.       . omeprazole (PRILOSEC) 20 MG capsule Take 20 mg by mouth daily.       . Rivaroxaban (XARELTO) 15 MG TABS tablet Take 15 mg by mouth daily.      . roflumilast (DALIRESP) 500 MCG TABS tablet Take 500 mcg by mouth daily.      . simvastatin (ZOCOR) 40 MG tablet Take 40 mg by mouth daily.       . Tamsulosin HCl (FLOMAX) 0.4 MG CAPS Take 0.8 mg by mouth daily after supper.      . tiotropium (SPIRIVA) 18 MCG inhalation capsule Place 18 mcg into inhaler and inhale daily.      Marland Kitchen albuterol (VENTOLIN HFA) 108 (90 BASE) MCG/ACT inhaler Inhale 2 puffs into the lungs every 6 (six) hours as needed. Shortness of breath      . mometasone (NASONEX) 50 MCG/ACT nasal spray Place 2 sprays into the nose daily.       Scheduled:   . diltiazem  180 mg Oral BID  . fluticasone  1 spray Each Nare Daily  . furosemide  20 mg Oral Daily  . glyBURIDE  5 mg Oral Q breakfast  . insulin aspart  0-5 Units Subcutaneous QHS  . insulin aspart  0-9 Units Subcutaneous TID WC  . ipratropium  0.5 mg Nebulization Q6H  . iron polysaccharides  150 mg Oral Daily  . levalbuterol  1.25 mg Nebulization Q6H  . metFORMIN  1,000 mg Oral Q breakfast  . mometasone-formoterol  2 puff Inhalation BID  . pantoprazole  40 mg Oral Daily  . Rivaroxaban  15 mg Oral Daily  . roflumilast  500 mcg Oral Daily  . simvastatin  40 mg Oral QPM  . sodium chloride  3 mL Intravenous Q12H  . Tamsulosin HCl  0.8 mg Oral QPC supper   Continuous:  ZOX:WRUEAVWUJ, HYDROmorphone (DILAUDID) injection, magnesium hydroxide, ondansetron (  ZOFRAN) IV, ondansetron, oxyCODONE,  traZODone  Assesment: He has what appears to be pneumonia. He has COPD exacerbation. He has a history of lung cancer but no overt evidence of that now. He is anemic and I think that's contributing to his shortness of breath Active Problems:  COPD with exacerbation  Iron deficiency anemia  DM type 2 (diabetes mellitus, type 2)  History of lung cancer  CKD (chronic kidney disease), stage III  Anemia due to chronic blood loss    Plan: I agree with antibiotics et Karie Soda. GI workup is underway.    LOS: 1 day   Santiago Graf L 11/09/2012, 9:00 AM

## 2012-11-09 NOTE — Progress Notes (Signed)
INITIAL NUTRITION ASSESSMENT  DOCUMENTATION CODES Per approved criteria  -Not Applicable    INTERVENTION: -Ensure Complete po BID, each supplement provides 350 kcal and 13 grams of protein.  NUTRITION DIAGNOSIS: Inadequate oral intake related to poor appetite, shortness of breath as evidenced by hx of COPD and current exacerbation and meal intake 0-25%.   Goal: Pt to meet >/= 90% of their estimated nutrition needs; not met  Monitor:  Monitor po's meals and supplement, wt trends and labs  Reason for Assessment: Malnutrition Screen Score=2  78 y.o. male  Admitting Dx: COPD with exacerbation  ASSESSMENT: Poor appetite and po intake 0-25% CHO Modified diet. PT says he had recently started drinking Ensure at home and is agreeable to continue while hospitalized.Hx of wt loss 11-12# (5.4kg), 7.3% in past 90 days trending toward significant. His oral intake is inadequate to meet estimated needs and he is at risk for malnutrition given his poor po intake and unplanned wt loss.  Height: Ht Readings from Last 1 Encounters:  11/08/12 5\' 8"  (1.727 m)   Weight: Wt Readings from Last 1 Encounters:  11/09/12 156 lb 8.4 oz (71 kg)   Ideal Body Weight: 154# (70kg)  % Ideal Body Weight: 102%  Wt Readings from Last 10 Encounters:  11/09/12 156 lb 8.4 oz (71 kg)  11/02/12 158 lb (71.668 kg)  10/27/12 158 lb (71.668 kg)  09/22/12 162 lb 12.8 oz (73.846 kg)  08/29/12 161 lb 2.5 oz (73.1 kg)  08/03/12 169 lb (76.658 kg)  07/20/12 167 lb (75.751 kg)  07/13/12 169 lb 4 oz (76.771 kg)  06/18/12 165 lb 8 oz (75.07 kg)  05/19/12 159 lb (72.122 kg)   Usual Body Weight: 93%  % Usual Body Weight: 169# (76.7 kg) September 2013  BMI:  Body mass index is 23.80 kg/(m^2).Normal Range  Estimated Nutritional Needs: Kcal: 1800-2130 kcal/day Protein:85-106 gr/day Fluid: 1 ml/kcal  Skin: no issues noted  Diet Order: Carb Control   EDUCATION NEEDS: -No education needs identified at this  time   Intake/Output Summary (Last 24 hours) at 11/09/12 1048 Last data filed at 11/09/12 0800  Gross per 24 hour  Intake 1743.33 ml  Output   1000 ml  Net 743.33 ml   Last BM: 11/08/12  Labs:   Lab 11/09/12 0511 11/08/12 1732  NA 138 141  K 5.1 4.3  CL 102 104  CO2 28 27  BUN 23 22  CREATININE 1.49* 1.46*  CALCIUM 9.7 10.0  MG -- 1.8  PHOS -- --  GLUCOSE 326* 111*    CBG (last 3)   Basename 11/09/12 0750  GLUCAP 247*    Scheduled Meds:   . azithromycin  500 mg Intravenous Q24H  . cefTRIAXone (ROCEPHIN)  IV  1 g Intravenous Q24H  . diltiazem  180 mg Oral BID  . fluticasone  1 spray Each Nare Daily  . furosemide  20 mg Oral Daily  . insulin aspart  0-5 Units Subcutaneous QHS  . insulin aspart  0-9 Units Subcutaneous TID WC  . insulin glargine  10 Units Subcutaneous BID  . ipratropium  0.5 mg Nebulization Q6H  . iron polysaccharides  150 mg Oral Daily  . levalbuterol  1.25 mg Nebulization Q6H  . metFORMIN  1,000 mg Oral Q breakfast  . methylPREDNISolone (SOLU-MEDROL) injection  60 mg Intravenous Q8H  . mometasone-formoterol  2 puff Inhalation BID  . pantoprazole  40 mg Oral Daily  . Rivaroxaban  15 mg Oral Daily  . roflumilast  500 mcg Oral Daily  . simvastatin  40 mg Oral QPM  . sodium chloride  3 mL Intravenous Q12H  . Tamsulosin HCl  0.8 mg Oral QPC supper   Continuous Infusions:   Past Medical History  Diagnosis Date  . Arteriosclerotic cardiovascular disease (ASCVD)     Myocardial infarction in 1980; chronic dyspnea on exertion; 04/2012-normal EF by echo  . Hyperlipidemia   . Hypertension   . Diabetes mellitus, type II   . Arthritis   . COPD (chronic obstructive pulmonary disease)     Home oxygen  . Colon polyps 08/2011    Per colonoscopy, Dr. Karilyn Cota.  Marland Kitchen BPH (benign prostatic hyperplasia)   . Urinary retention 11/15/2011  . Renal mass, right 11/16/2011  . Iron deficiency anemia 11/14/2011  . Atrial flutter with rapid ventricular response  04/29/2012  . Lung cancer   . Chronic respiratory failure with hypoxia 11/09/2012  . Pulmonary nodules 11/09/2012    Past Surgical History  Procedure Date  . Soft tissue cyst excision     Face  . Colonoscopy 08/28/2011    Procedure: COLONOSCOPY;  Surgeon: Malissa Hippo, MD;  Location: [facility];  Service: Endoscopy;  Laterality: N/A;      #[phone]

## 2012-11-09 NOTE — Progress Notes (Signed)
UR Chart Review Completed  

## 2012-11-09 NOTE — Consult Note (Signed)
Reason for Consult:anemia Referring Physician: Wofford Hall is an 78 y.o. male.  HPI: Admitted thru the ED yesterday with c/o SOB x 2-3 months. Hx of COPD and Lung cancer. Noted on admission his hemoglobin was 7.9. Hx iron deficiency anemia and takes iron on a daily basis.  He denies rectal bleeding. His BMs are black since starting the iron. He tells me he stools was positive for blood in the ED yesterday. Per ED record: stools brown and guaiac positive.  He has lost approximately 11 pounds over the past 3 months.  He tells me his appetite is not good. He denies abdominal pain.  He has received 2 units of PRBCs since admission.  Denies hematemesis.  Hx of atrial flutter and maintained on Xarelto.  Denies taking NSAIDs Takes Tylenol prn.    Colonoscopy 08/28/2011: Impression:  Examination performed to cecum.  2 small polyps ablated via cold biopsy one from ascending colon and second one from splenic flexure.  Biopsy: tubular adenoma    Past Medical History  Diagnosis Date  . Arteriosclerotic cardiovascular disease (ASCVD)     Myocardial infarction in 1980; chronic dyspnea on exertion; 04/2012-normal EF by echo  . Hyperlipidemia   . Hypertension   . Diabetes mellitus, type II   . Arthritis   . COPD (chronic obstructive pulmonary disease)     Home oxygen  . Colon polyps 08/2011    Per colonoscopy, Dr. Karilyn Cota.  Marland Kitchen BPH (benign prostatic hyperplasia)   . Urinary retention 11/15/2011  . Renal mass, right 11/16/2011  . Iron deficiency anemia 11/14/2011  . Atrial flutter with rapid ventricular response 04/29/2012  . Lung cancer     Past Surgical History  Procedure Date  . Soft tissue cyst excision     Face  . Colonoscopy 08/28/2011    Procedure: COLONOSCOPY;  Surgeon: Malissa Hippo, MD;  Location: [facility];  Service: Endoscopy;  Laterality: N/A;    Family History  Problem Relation Age of Onset  . Cancer Mother   . Cancer Sister   . Cancer Brother      Social History:  reports that he quit smoking about 4 years ago. His smoking use included Cigarettes. He has a 90 pack-year smoking history. He has never used smokeless tobacco. He reports that he does not drink alcohol or use illicit drugs.  Allergies: No Known Allergies  Medications: I have reviewed the patient's current medications.  Results for orders placed during the hospital encounter of 11/08/12 (from the past 48 hour(s))  CBC WITH DIFFERENTIAL     Status: Abnormal   Collection Time   11/08/12  5:32 PM      Component Value Range Comment   WBC 6.4  4.0 - 10.5 K/uL    RBC 3.81 (*) 4.22 - 5.81 MIL/uL    Hemoglobin 7.9 (*) 13.0 - 17.0 g/dL    HCT 16.1 (*) 09.6 - 52.0 %    MCV 73.2 (*) 78.0 - 100.0 fL    MCH 20.7 (*) 26.0 - 34.0 pg    MCHC 28.3 (*) 30.0 - 36.0 g/dL    RDW 04.5 (*) 40.9 - 15.5 %    Platelets 316  150 - 400 K/uL    Neutrophils Relative 68  43 - 77 %    Neutro Abs 4.3  1.7 - 7.7 K/uL    Lymphocytes Relative 17  12 - 46 %    Lymphs Abs 1.1  0.7 - 4.0 K/uL    Monocytes  Relative 14 (*) 3 - 12 %    Monocytes Absolute 0.9  0.1 - 1.0 K/uL    Eosinophils Relative 1  0 - 5 %    Eosinophils Absolute 0.1  0.0 - 0.7 K/uL    Basophils Relative 0  0 - 1 %    Basophils Absolute 0.0  0.0 - 0.1 K/uL   BASIC METABOLIC PANEL     Status: Abnormal   Collection Time   11/08/12  5:32 PM      Component Value Range Comment   Sodium 141  135 - 145 mEq/L    Potassium 4.3  3.5 - 5.1 mEq/L    Chloride 104  96 - 112 mEq/L    CO2 27  19 - 32 mEq/L    Glucose, Bld 111 (*) 70 - 99 mg/dL    BUN 22  6 - 23 mg/dL    Creatinine, Ser 9.52 (*) 0.50 - 1.35 mg/dL    Calcium 84.1  8.4 - 10.5 mg/dL    GFR calc non Af Amer 44 (*) >90 mL/min    GFR calc Af Amer 51 (*) >90 mL/min   TROPONIN I     Status: Normal   Collection Time   11/08/12  5:32 PM      Component Value Range Comment   Troponin I <0.30  <0.30 ng/mL   RETICULOCYTES     Status: Abnormal   Collection Time   11/08/12  5:32 PM       Component Value Range Comment   Retic Ct Pct 1.2  0.4 - 3.1 %    RBC. 3.77 (*) 4.22 - 5.81 MIL/uL    Retic Count, Manual 45.2  19.0 - 186.0 K/uL   PRO B NATRIURETIC PEPTIDE     Status: Abnormal   Collection Time   11/08/12  5:32 PM      Component Value Range Comment   Pro B Natriuretic peptide (BNP) 993.1 (*) 0 - 450 pg/mL   HEPATIC FUNCTION PANEL     Status: Abnormal   Collection Time   11/08/12  5:32 PM      Component Value Range Comment   Total Protein 7.6  6.0 - 8.3 g/dL    Albumin 3.4 (*) 3.5 - 5.2 g/dL    AST 16  0 - 37 U/L    ALT 14  0 - 53 U/L    Alkaline Phosphatase 68  39 - 117 U/L    Total Bilirubin 0.3  0.3 - 1.2 mg/dL    Bilirubin, Direct 0.1  0.0 - 0.3 mg/dL    Indirect Bilirubin 0.2 (*) 0.3 - 0.9 mg/dL   MAGNESIUM     Status: Normal   Collection Time   11/08/12  5:32 PM      Component Value Range Comment   Magnesium 1.8  1.5 - 2.5 mg/dL   TYPE AND SCREEN     Status: Normal (Preliminary result)   Collection Time   11/08/12  7:50 PM      Component Value Range Comment   ABO/RH(D) B POS      Antibody Screen NEG      Sample Expiration 11/11/2012      Unit Number [id number]      Blood Component Type RED CELLS,LR      Unit division 00      Status of Unit ISSUED,FINAL      Transfusion Status OK TO TRANSFUSE      Crossmatch Result Compatible  Unit Number [id number]      Blood Component Type RED CELLS,LR      Unit division 00      Status of Unit ISSUED      Transfusion Status OK TO TRANSFUSE      Crossmatch Result Compatible     ABO/RH     Status: Normal   Collection Time   11/08/12  7:50 PM      Component Value Range Comment   ABO/RH(D) B POS     PREPARE RBC (CROSSMATCH)     Status: Normal   Collection Time   11/08/12  7:50 PM      Component Value Range Comment   Order Confirmation ORDER PROCESSED BY BLOOD BANK     BASIC METABOLIC PANEL     Status: Abnormal   Collection Time   11/09/12  5:11 AM      Component Value Range Comment    Sodium 138  135 - 145 mEq/L    Potassium 5.1  3.5 - 5.1 mEq/L    Chloride 102  96 - 112 mEq/L    CO2 28  19 - 32 mEq/L    Glucose, Bld 326 (*) 70 - 99 mg/dL    BUN 23  6 - 23 mg/dL    Creatinine, Ser 4.13 (*) 0.50 - 1.35 mg/dL    Calcium 9.7  8.4 - 24.4 mg/dL    GFR calc non Af Amer 43 (*) >90 mL/min    GFR calc Af Amer 49 (*) >90 mL/min   CBC     Status: Abnormal   Collection Time   11/09/12  5:11 AM      Component Value Range Comment   WBC 4.7  4.0 - 10.5 K/uL    RBC 4.65  4.22 - 5.81 MIL/uL    Hemoglobin 10.3 (*) 13.0 - 17.0 g/dL DELTA CHECK NOTED   HCT 35.4 (*) 39.0 - 52.0 %    MCV 76.1 (*) 78.0 - 100.0 fL    MCH 22.2 (*) 26.0 - 34.0 pg    MCHC 29.1 (*) 30.0 - 36.0 g/dL    RDW 01.0 (*) 27.2 - 15.5 %    Platelets 335  150 - 400 K/uL   URINALYSIS, ROUTINE W REFLEX MICROSCOPIC     Status: Abnormal   Collection Time   11/09/12  5:17 AM      Component Value Range Comment   Color, Urine YELLOW  YELLOW    APPearance CLEAR  CLEAR    Specific Gravity, Urine 1.020  1.005 - 1.030    pH 5.5  5.0 - 8.0    Glucose, UA 100 (*) NEGATIVE mg/dL    Hgb urine dipstick SMALL (*) NEGATIVE    Bilirubin Urine NEGATIVE  NEGATIVE    Ketones, ur NEGATIVE  NEGATIVE mg/dL    Protein, ur NEGATIVE  NEGATIVE mg/dL    Urobilinogen, UA 0.2  0.0 - 1.0 mg/dL    Nitrite NEGATIVE  NEGATIVE    Leukocytes, UA NEGATIVE  NEGATIVE   URINE MICROSCOPIC-ADD ON     Status: Normal   Collection Time   11/09/12  5:17 AM      Component Value Range Comment   Squamous Epithelial / LPF RARE  RARE    RBC / HPF 11-20  <3 RBC/hpf    Bacteria, UA RARE  RARE   GLUCOSE, CAPILLARY     Status: Abnormal   Collection Time   11/09/12  7:50 AM      Component Value  Range Comment   Glucose-Capillary 247 (*) 70 - 99 mg/dL    Comment 1 Documented in Chart      Comment 2 Notify RN       Ct Chest Wo Contrast  11/08/2012  *RADIOLOGY REPORT*  Clinical Data: Shortness of breath.  Atrial fibrillation, lung cancer.  CT CHEST  WITHOUT CONTRAST  Technique:  Multidetector CT imaging of the chest was performed following the standard protocol without IV contrast.  Comparison: 11/08/2012 radiograph, 08/28/2012 CT  Findings: Post treatment changes involving the right chest. Emphysematous change.  Small right pleural effusion is similar to prior.  Aortic ascending ectasia, tapers to a normal caliber. Scattered atherosclerotic calcification.  Further vascular evaluation not possible without intravenous contrast.  Heart size upper normal.  Coronary artery calcification.  No pericardial effusion.  Right hilar soft tissue attenuation/radiation changes are similar to prior.  No new hilar process or adenopathy.  Limited images through the upper abdomen are degraded by respiratory motion.  There is a probable cyst exophytic from the upper posterior right kidney.  Narrowed right upper lobe bronchi are similar to prior.  Otherwise, central airways are patent.  There is bilateral sub centimeter nodularity within the lower lobes that has developed in the interval. This is superimposed on left greater right posterior lung base opacities, similar to mildly more prominent in the interval.  Motion degrades osseous evaluation.  No definite acute osseous finding.  IMPRESSION: Post-treatment changes involving the right lung are similar to recent prior.  There is a new bilateral lower lobe nodularity which can be infectious, aspiration, or less likely (though not excluded) metastatic disease.  Recommend short-term chest CT follow-up.   Original Report Authenticated By: Jearld Lesch, M.D.    Dg Chest Portable 1 View  11/08/2012  *RADIOLOGY REPORT*  Clinical Data: Shortness of breath.  History of lung cancer.  PORTABLE CHEST - 1 VIEW  Comparison: 08/28/2012 and CT scan of 08/28/2012  Findings: There is a new vague area of haziness at the left base laterally which may represent early infiltrate.  Lungs are hyperinflated consistent with emphysema.  Scarring  and retraction of the hilar structures on the right.  Scarring at the right lung base and in the right upper lung zone including vague nodularity. Chronic prominence of the pulmonary arteries.  IMPRESSION: New area of faint haziness at the left lung base which may represent an early infiltrate.   Original Report Authenticated By: Francene Boyers, M.D.     ROS Blood pressure 121/75, pulse 88, temperature 97.6 F (36.4 C), temperature source Oral, resp. rate 28, height 5\' 8"  (1.727 m), weight 156 lb 8.4 oz (71 kg), SpO2 100.00%. Physical ExamAlert and oriented. Skin warm and dry. Oral mucosa is moist.   . Sclera anicteric, conjunctivae is pink. Thyroid not enlarged. No cervical lymphadenopathy. Lungs clear, very shallow. . Heart regular rate and rhythm.  Abdomen is soft. Bowel sounds are positive.  No edema to lower extremities.   Assessment/Plan: IDA and Guaiac positive stool. Will discuss with Dr. Karilyn Cota. Colonoscopy in 2012 revealed tubular adenoma. Possible EGD.  SETZER,TERRI W 11/09/2012, 8:26 AM     GI attending note; Patient interviewed, examined and old records reviewed. Acute on chronic anemia with black and heme positive stools(patient taking iron pills). Patient has received 2 units of PRBCs. His hemoglobin has increased from 7.92 10.3 g and he states he can read better but he still appears to be short of breath. Iron studies confirmed that he has iron deficiency  anemia. He did have colonoscopy by me in October 2012 with removal of two small tubular adenomas. Patient denies history of peptic ulcer disease and he does not take OTC NSAIDs. He could be losing blood chronically from this upper GI tract or small bowel. Recommendations; Esophagogastroduodenoscopy on 11/10/2012 if he is deemed to be stable from pulmonary standpoint. Will tentatively schedule him in a.m.

## 2012-11-10 ENCOUNTER — Encounter (HOSPITAL_COMMUNITY): Payer: Self-pay | Admitting: *Deleted

## 2012-11-10 ENCOUNTER — Encounter (HOSPITAL_COMMUNITY)
Admission: EM | Disposition: A | Discharge status: Home Health | Payer: Self-pay | Source: Home / Self Care | Attending: Internal Medicine

## 2012-11-10 DIAGNOSIS — D500 Iron deficiency anemia secondary to blood loss (chronic): Secondary | ICD-10-CM

## 2012-11-10 DIAGNOSIS — J441 Chronic obstructive pulmonary disease with (acute) exacerbation: Principal | ICD-10-CM

## 2012-11-10 LAB — GLUCOSE, CAPILLARY
Glucose-Capillary: 368 mg/dL — ABNORMAL HIGH (ref 70–99)
Glucose-Capillary: 227 mg/dL — ABNORMAL HIGH (ref 70–99)

## 2012-11-10 LAB — BASIC METABOLIC PANEL
BUN: 29 mg/dL — ABNORMAL HIGH (ref 6–23)
GFR calc Af Amer: 51 mL/min — ABNORMAL LOW (ref >90)
GFR calc non Af Amer: 44 mL/min — ABNORMAL LOW (ref >90)
Potassium: 4.6 mEq/L (ref 3.5–5.1)
Sodium: 139 mEq/L (ref 135–145)

## 2012-11-10 LAB — TYPE AND SCREEN
ABO/RH(D): B POS
Unit division: 0

## 2012-11-10 LAB — PROTIME-INR
INR: 1.63 — ABNORMAL HIGH (ref 0.00–1.49)
Prothrombin Time: 18.8 seconds — ABNORMAL HIGH (ref 11.6–15.2)

## 2012-11-10 LAB — CBC
Hemoglobin: 10.2 g/dL — ABNORMAL LOW (ref 13.0–17.0)
MCHC: 30.4 g/dL (ref 30.0–36.0)

## 2012-11-10 SURGERY — CANCELLED PROCEDURE

## 2012-11-10 MED ORDER — BIOTENE DRY MOUTH MT LIQD
15.0000 mL | Freq: Two times a day (BID) | OROMUCOSAL | Status: DC
  Administered 2012-11-10 – 2012-11-14 (×8): 15 mL via OROMUCOSAL

## 2012-11-10 MED ORDER — DILTIAZEM HCL 60 MG PO TABS
120.0000 mg | ORAL_TABLET | Freq: Four times a day (QID) | ORAL | Status: DC
  Administered 2012-11-10 – 2012-11-13 (×11): 120 mg via ORAL
  Filled 2012-11-10 (×6): qty 2
  Filled 2012-11-10: qty 1
  Filled 2012-11-10: qty 2
  Filled 2012-11-10: qty 1
  Filled 2012-11-10 (×3): qty 2

## 2012-11-10 MED ORDER — DIGOXIN 0.25 MG/ML IJ SOLN
0.5000 mg | Freq: Once | INTRAMUSCULAR | Status: AC
  Administered 2012-11-10: 0.5 mg via INTRAVENOUS
  Filled 2012-11-10: qty 2

## 2012-11-10 MED ORDER — SODIUM CHLORIDE 0.9 % IV SOLN: INTRAVENOUS | Status: DC

## 2012-11-10 MED ORDER — AZITHROMYCIN 250 MG PO TABS
500.0000 mg | ORAL_TABLET | Freq: Every day | ORAL | Status: DC
  Administered 2012-11-10 – 2012-11-13 (×4): 500 mg via ORAL
  Filled 2012-11-10 (×4): qty 2

## 2012-11-10 MED ORDER — DIGOXIN 0.25 MG/ML IJ SOLN
0.2500 mg | Freq: Once | INTRAMUSCULAR | Status: AC
  Administered 2012-11-10: 0.25 mg via INTRAVENOUS
  Filled 2012-11-10: qty 2

## 2012-11-10 MED ORDER — DIGOXIN 125 MCG PO TABS
0.1250 mg | ORAL_TABLET | Freq: Every day | ORAL | Status: DC
  Administered 2012-11-11 – 2012-11-14 (×4): 0.125 mg via ORAL
  Filled 2012-11-10 (×4): qty 1

## 2012-11-10 NOTE — Progress Notes (Signed)
PHARMACIST - PHYSICIAN COMMUNICATION DR:   Hawkins CONCERNING: Antibiotic IV to Oral Route Change Policy  RECOMMENDATION: This patient is receiving Zithromax by the intravenous route.  Based on criteria approved by the Pharmacy and Therapeutics Committee, the antibiotic(s) is/are being converted to the equivalent oral dose form(s).  DESCRIPTION: These criteria include:  Patient being treated for a respiratory tract infection, urinary tract infection, or cellulitis  The patient is not neutropenic and does not exhibit a GI malabsorption state  The patient is eating (either orally or via tube) and/or has been taking other orally administered medications for a least 24 hours  The patient is improving clinically and has a Tmax < 100.5  If you have questions about this conversion, please contact the Pharmacy Department  [x]  ( [phone] )  [place] []  ( [phone] )  [place]  []  ( [phone] )  [facility] []  ( [phone] )  [facility]   S. Aisea Bouldin, PharmD  

## 2012-11-10 NOTE — Progress Notes (Signed)
Brendan Hall  78 y.o.  male  Subjective: Patient reports improved general sense of well-being and improved respiratory status. No lightheadedness, chest discomfort or syncope.  Allergy: Review of patient's allergies indicates no known allergies.  Objective: Vital signs in last 24 hours: Temp:  [97.5 F (36.4 C)-97.9 F (36.6 C)] 97.9 F (36.6 C) (12/17 1437) Pulse Rate:  [106-140] 107  (12/17 1656) Resp:  [20-21] 20  (12/17 1437) BP: (110-142)/(69-96) 136/78 mmHg (12/17 1656) SpO2:  [92 %-100 %] 94 % (12/17 1656) Weight:  [70.3 kg (154 lb 15.7 oz)] 70.3 kg (154 lb 15.7 oz) (12/17 0450)  70.3 kg (154 lb 15.7 oz) Body mass index is 23.57 kg/(m^2).  Weight change: 0.3 kg (10.6 oz) Last BM Date: 11/08/12  Intake/Output from previous day: 12/16 0701 - 12/17 0700 In: 720 [P.O.:720] Out: 1100 [Urine:1100]  General- Well developed; no acute distress  Neck- No JVD, no carotid bruits Lungs- clear lung fields; kyphosis; increased AP diameter; prolonged expiratory phase Cardiovascular- normal PMI; normal S1 and S2; rapid irregular rhythm Abdomen- normal bowel sounds; soft and non-tender without masses or organomegaly Skin- Warm, no significant lesions Extremities- Nl distal pulses; no edema  Lab Results: Cardiac Markers:   Basename 11/08/12 1732  TROPONINI <0.30   CBC:   Basename 11/10/12 0536 11/09/12 1421  WBC 5.7 8.0  HGB 10.2* 10.3*  HCT 33.6* 35.0*  PLT 348 359   BMET:  Basename 11/10/12 0536 11/09/12 1421  NA 139 142  K 4.6 4.8  CL 103 104  CO2 26 26  GLUCOSE 249* 164*  BUN 29* 26*  CREATININE 1.45* 1.58*  CALCIUM 9.7 10.1   Hepatic Function:   Basename 11/08/12 1732  PROT 7.6  ALBUMIN 3.4*  AST 16  ALT 14  ALKPHOS 68  BILITOT 0.3  BILIDIR 0.1  IBILI 0.2*   GFR:  Estimated Creatinine Clearance: 39.3 ml/min (by C-G formula based on Cr of 1.45).  Telemetry:  Atrial flutter; 2:1 AV conduction earlier today with ventricular rate of 140; subsequently,  AV block has increased and heart rate decreased, but still persistently 110-120 BPM.  Imaging Studies/Results: Ct Chest Wo Contrast 11/08/2012  * Post-treatment changes involving the right lung are similar to recent prior.  There is a new bilateral lower lobe nodularity which can be infectious, aspiration, or less likely (though not excluded) metastatic disease.    Imaging: Imaging results have been reviewed  Medications: I have reviewed the patient's current medications.   Assessment/Plan: Iron deficiency anemia: Endoscopy canceled for today as a result of an increased heart rate. I am available to assist in dealing with tachycardia when and if it recurs. Procedures need not be canceled as a result of transiently increased heart rate.  Atrial flutter: Long-standing problem; we will adjust rate control medication, but anxiety or a problem with atrial flutter is that adequate medication to control heart rate at rest is often insufficient in the setting of anxiety or exercise.  Digoxin added to his regime. Heart rate has decreased from 140 to 110.  Dose of diltiazem will be increased to 120 mg 4 times a day.  Radiofrequency ablation scheduled to occur at [facility] later this week will be postponed.  COPD: Relative contraindication to the use of beta blocker.  Low dose beta 2 selective medication can be tried if absolutely necessary.   LOS: 2 days   LaMoure Bing 11/10/2012, 6:38 PM

## 2012-11-10 NOTE — Plan of Care (Signed)
Problem: Phase I Progression Outcomes Goal: Progress activity as tolerated unless otherwise ordered Outcome: Completed/Met Date Met:  11/10/12 11/10/12 1226 Patient up to chair at this time, states comfortable. Requires assistance for activity. Family at bedside at this time, call light within reach. Instructed to call as needed, pt verbalizes understanding.

## 2012-11-10 NOTE — OR Nursing (Signed)
Patient to endoscopy for an EGD. Patient states, "I can't breathe." Non-rebreather applied and patient O2 sat is 100%. Patient's heart rate is 139-140-atrial flutter. Dr. Karilyn Cota cancelled patient's procedure until heart rate has stabilized. Non-rebreather removed and placed on 4Liters nasal cannula and O2 sat is 95%. Report called to Earnstine Regal RN.

## 2012-11-10 NOTE — Progress Notes (Signed)
Patient was seen this morning in his room and appeared to be comfortable. His heart rate was around 110. Patient brought to endoscopy suite for EGD and has had vague 140 and appears to be atrial flutter. His blood pressure is normal and his O2 sat is 98-100% on nasal O2. EGD postponed until tachyarrhythmia better controlled.

## 2012-11-10 NOTE — Progress Notes (Signed)
[facility]             Progress Note   Subjective: Feels her breathing is slowly improving. He is coughing is nonproductive. Denies any chest pain. He is currently receiving a breathing treatment at the time of the visit.  Objective: Vital signs in last 24 hours: Temp:  [97.5 F (36.4 C)-97.9 F (36.6 C)] 97.9 F (36.6 C) (12/17 1437) Pulse Rate:  [106-140] 140  (12/17 1437) Resp:  [20-21] 20  (12/17 1437) BP: (110-142)/(69-96) 131/80 mmHg (12/17 1437) SpO2:  [92 %-100 %] 98 % (12/17 1437) Weight:  [70.3 kg (154 lb 15.7 oz)] 70.3 kg (154 lb 15.7 oz) (12/17 0450) Weight change: 0.3 kg (10.6 oz) Last BM Date: 11/08/12  Intake/Output from previous day: 12/16 0701 - 12/17 0700 In: 720 [P.O.:720] Out: 1100 [Urine:1100] Total I/O In: 240 [P.O.:240] Out: 450 [Urine:450]   Physical Exam: General: Alert, awake, oriented x3, in no acute distress. HEENT: No bruits, no goiter. Heart: Irregular, tachycardic Lungs: Clear to auscultation bilaterally. Abdomen: Soft, nontender, nondistended, positive bowel sounds. Extremities: 1+ pedal edema bilaterally Neuro: Grossly intact, nonfocal.    Lab Results: Basic Metabolic Panel:  Basename 11/10/12 0536 11/09/12 1421 11/08/12 1732  NA 139 142 --  K 4.6 4.8 --  CL 103 104 --  CO2 26 26 --  GLUCOSE 249* 164* --  BUN 29* 26* --  CREATININE 1.45* 1.58* --  CALCIUM 9.7 10.1 --  MG -- -- 1.8  PHOS -- -- --   Liver Function Tests:  Basename 11/08/12 1732  AST 16  ALT 14  ALKPHOS 68  BILITOT 0.3  PROT 7.6  ALBUMIN 3.4*   No results found for this basename: LIPASE:2,AMYLASE:2 in the last 72 hours No results found for this basename: AMMONIA:2 in the last 72 hours CBC:  Basename 11/10/12 0536 11/09/12 1421 11/08/12 1732  WBC 5.7 8.0 --  NEUTROABS -- -- 4.3  HGB 10.2* 10.3* --  HCT 33.6* 35.0* --  MCV 75.2* 76.1* --  PLT 348 359 --   Cardiac Enzymes:  Basename 11/08/12 1732  CKTOTAL --  CKMB --   CKMBINDEX --  TROPONINI <0.30   BNP:  [facility] 11/08/12 1732  PROBNP 993.1*   D-Dimer: No results found for this basename: DDIMER:2 in the last 72 hours CBG:  Basename 11/10/12 1120 11/10/12 0900 11/10/12 0726 11/09/12 2136 11/09/12 1630 11/09/12 1112  GLUCAP 212* 247* 210* 350* 192* 241*   Hemoglobin A1C:  Basename 11/08/12 1732  HGBA1C 7.1*   Fasting Lipid Panel: No results found for this basename: CHOL,HDL,LDLCALC,TRIG,CHOLHDL,LDLDIRECT in the last 72 hours Thyroid Function Tests:  Basename 11/08/12 1732  TSH 1.635  T4TOTAL --  FREET4 --  T3FREE --  THYROIDAB --   Anemia Panel:  Basename 11/08/12 1732  VITAMINB12 1148*  FOLATE 16.9  FERRITIN 14*  TIBC 450*  IRON 19*  RETICCTPCT 1.2   Coagulation:  Basename 11/10/12 0536  LABPROT 18.8*  INR 1.63*   Urine Drug Screen: Drugs of Abuse  No results found for this basename: labopia, cocainscrnur, labbenz, amphetmu, thcu, labbarb    Alcohol Level: No results found for this basename: ETH:2 in the last 72 hours Urinalysis:  Basename 11/09/12 0517  COLORURINE YELLOW  LABSPEC 1.020  PHURINE 5.5  GLUCOSEU 100*  HGBUR SMALL*  BILIRUBINUR NEGATIVE  KETONESUR NEGATIVE  PROTEINUR NEGATIVE  UROBILINOGEN 0.2  NITRITE NEGATIVE  LEUKOCYTESUR NEGATIVE   No results found for this or any previous visit (from the past 240 hour(s)).  Studies/Results: Ct Chest Wo Contrast  11/08/2012  *RADIOLOGY REPORT*  Clinical Data: Shortness of breath.  Atrial fibrillation, lung cancer.  CT CHEST WITHOUT CONTRAST  Technique:  Multidetector CT imaging of the chest was performed following the standard protocol without IV contrast.  Comparison: 11/08/2012 radiograph, 08/28/2012 CT  Findings: Post treatment changes involving the right chest. Emphysematous change.  Small right pleural effusion is similar to prior.  Aortic ascending ectasia, tapers to a normal caliber. Scattered atherosclerotic calcification.  Further vascular  evaluation not possible without intravenous contrast.  Heart size upper normal.  Coronary artery calcification.  No pericardial effusion.  Right hilar soft tissue attenuation/radiation changes are similar to prior.  No new hilar process or adenopathy.  Limited images through the upper abdomen are degraded by respiratory motion.  There is a probable cyst exophytic from the upper posterior right kidney.  Narrowed right upper lobe bronchi are similar to prior.  Otherwise, central airways are patent.  There is bilateral sub centimeter nodularity within the lower lobes that has developed in the interval. This is superimposed on left greater right posterior lung base opacities, similar to mildly more prominent in the interval.  Motion degrades osseous evaluation.  No definite acute osseous finding.  IMPRESSION: Post-treatment changes involving the right lung are similar to recent prior.  There is a new bilateral lower lobe nodularity which can be infectious, aspiration, or less likely (though not excluded) metastatic disease.  Recommend short-term chest CT follow-up.   Original Report Authenticated By: Jearld Lesch, M.D.    Dg Chest Portable 1 View  11/08/2012  *RADIOLOGY REPORT*  Clinical Data: Shortness of breath.  History of lung cancer.  PORTABLE CHEST - 1 VIEW  Comparison: 08/28/2012 and CT scan of 08/28/2012  Findings: There is a new vague area of haziness at the left base laterally which may represent early infiltrate.  Lungs are hyperinflated consistent with emphysema.  Scarring and retraction of the hilar structures on the right.  Scarring at the right lung base and in the right upper lung zone including vague nodularity. Chronic prominence of the pulmonary arteries.  IMPRESSION: New area of faint haziness at the left lung base which may represent an early infiltrate.   Original Report Authenticated By: Francene Boyers, M.D.     Medications: Scheduled Meds:   . antiseptic oral rinse  15 mL Mouth Rinse  BID  . azithromycin  500 mg Oral q1800  . cefTRIAXone (ROCEPHIN)  IV  1 g Intravenous Q24H  . diltiazem  90 mg Oral QID  . fluticasone  1 spray Each Nare Daily  . furosemide  20 mg Oral Daily  . insulin aspart  0-5 Units Subcutaneous QHS  . insulin aspart  0-9 Units Subcutaneous TID WC  . insulin glargine  10 Units Subcutaneous BID  . ipratropium  0.5 mg Nebulization Q6H  . iron polysaccharides  150 mg Oral Daily  . levalbuterol  1.25 mg Nebulization Q6H  . metFORMIN  1,000 mg Oral Q breakfast  . methylPREDNISolone (SOLU-MEDROL) injection  60 mg Intravenous Q8H  . mometasone-formoterol  2 puff Inhalation BID  . pantoprazole  40 mg Oral Daily  . Rivaroxaban  15 mg Oral Daily  . roflumilast  500 mcg Oral Daily  . simvastatin  40 mg Oral QPM  . sodium chloride  3 mL Intravenous Q12H  . Tamsulosin HCl  0.8 mg Oral QPC supper   Continuous Infusions:  PRN Meds:.bisacodyl, HYDROmorphone (DILAUDID) injection, magnesium hydroxide, ondansetron (ZOFRAN) IV, ondansetron, oxyCODONE,  traZODone  Assessment/Plan:  Principal Problem:  *COPD with exacerbation Active Problems:  Iron deficiency anemia  DM type 2 (diabetes mellitus, type 2)  History of lung cancer  Atrial flutter  CKD (chronic kidney disease), stage III  Anemia due to chronic blood loss  Chronic respiratory failure with hypoxia  Pulmonary nodules  Pneumonia  .Acute on chronic dyspnea. Etiology likely multifactorial. See below. Improving  Acute on Chronic hypoxic respiratory failure secondary to COPD/COPD with exacerbation. The patient chronically uses 3 L of oxygen at home. We'll continue bronchodilator therapy with Dulera, Xopenex nebulizer, and Atrovent nebulizer. Continue oxygen at 2-3 L a minute. Will restart antibiotics and steroid. Appreciate Dr. Juanetta Gosling systems. He appears to have severe COPD and very poor functional capacity at baseline. Patient becomes exhausted when walking from one room to another in his house. He may  likely be approaching his baseline respiratory status. Continue current treatments   Pulmonary nodularity. We'll treat this as community-acquired pneumonia. However, in the setting of history of lung cancer, the patient may need further imaging with a PET scan electively.   Atrial flutter with RVR. Heart rate remains uncontrolled, mostly in the 140s. Patient is on max dose of diltiazem. He is scheduled for what sounds to be DC cardioversion later in the week. He is anticoagulated with the Xarelto. Cardiology is following. Would avoid beta blockers and amiodarone due to underlying lung disease. We will start him on digoxin. Further recommendations per cardiology.  Microcytic, iron deficiency anemia, presumed to be secondary to chronic blood loss; in the setting of chronic anticoagulation. Status post 2 units of packed red blood cell transfusion. The patient has a history of diverticulosis per colonoscopy in October 2012. He is on Protonix empirically. GI his been consulted and was planning on EGD this morning. This procedure was canceled due to the patient's tachyarrhythmia. He will need EGD once his heart rate is under better control.   Chronic kidney disease. Currently stable.   Query diastolic dysfunction. Echocardiogram in June 2013 could not evaluate for diastolic dysfunction. The patient was given a total of 60 mg of Lasix following the blood transfusions.   Time spent coordinating care:   LOS: 2 days   MEMON,JEHANZEB [facility] Pager: [phone] 11/10/2012, 3:45 PM

## 2012-11-10 NOTE — Progress Notes (Signed)
Inpatient Diabetes Program Recommendations  AACE/ADA: New Consensus Statement on Inpatient Glycemic Control (2013)  Target Ranges:  Prepandial:   less than 140 mg/dL      Peak postprandial:   less than 180 mg/dL (1-2 hours)      Critically ill patients:  140 - 180 mg/dL   Results for GUS, LITTLER (MRN [id number]) as of 11/10/2012 08:10  Ref. Range 11/09/2012 07:50 11/09/2012 11:12 11/09/2012 16:30 11/09/2012 21:36 11/10/2012 07:26  Glucose-Capillary Latest Range: 70-99 mg/dL 409 (H) 811 (H) 914 (H) 350 (H) 210 (H)    Inpatient Diabetes Program Recommendations Insulin - Basal: Please consider increasing Lantus to 15 units BID. Correction (SSI): Please consider increasing correction scale to Novolog moderate scale.  Note: Fasting lab blood glucose at 5:00am was 249 mg/dl and fasting finger stick was 210 mg/dl around 7:82NF.  Please consider increasing Lantus to 15 units BID and increasing correction scale from sensitive to moderate scale.  Will continue to follow.  Thanks, Orlando Penner, RN, BSN, CCRN Diabetes Coordinator Inpatient Diabetes Program [phone]

## 2012-11-10 NOTE — Progress Notes (Signed)
Subjective: He is admitted with shortness of breath which seems to be multifactorial. He says he feels a little bit better.  Objective: Vital signs in last 24 hours: Temp:  [97.4 F (36.3 C)-97.9 F (36.6 C)] 97.9 F (36.6 C) (12/17 0450) Pulse Rate:  [106-130] 106  (12/17 0450) Resp:  [20-22] 20  (12/17 0450) BP: (110-130)/(69-96) 130/96 mmHg (12/17 0450) SpO2:  [92 %-97 %] 95 % (12/17 0715) Weight:  [70.3 kg (154 lb 15.7 oz)] 70.3 kg (154 lb 15.7 oz) (12/17 0450) Weight change: 0.3 kg (10.6 oz) Last BM Date: 11/08/12  Intake/Output from previous day: 12/16 0701 - 12/17 0700 In: 720 [P.O.:720] Out: 1100 [Urine:1100]  PHYSICAL EXAM General appearance: alert, cooperative and mild distress Resp: rhonchi bilaterally Cardio: regular rate and rhythm, S1, S2 normal, no murmur, click, rub or gallop GI: soft, non-tender; bowel sounds normal; no masses,  no organomegaly Extremities: extremities normal, atraumatic, no cyanosis or edema  Lab Results:    Basic Metabolic Panel:  Basename 11/10/12 0536 11/09/12 1421 11/08/12 1732  NA 139 142 --  K 4.6 4.8 --  CL 103 104 --  CO2 26 26 --  GLUCOSE 249* 164* --  BUN 29* 26* --  CREATININE 1.45* 1.58* --  CALCIUM 9.7 10.1 --  MG -- -- 1.8  PHOS -- -- --   Liver Function Tests:  [facility] 11/08/12 1732  AST 16  ALT 14  ALKPHOS 68  BILITOT 0.3  PROT 7.6  ALBUMIN 3.4*   No results found for this basename: LIPASE:2,AMYLASE:2 in the last 72 hours No results found for this basename: AMMONIA:2 in the last 72 hours CBC:  Basename 11/10/12 0536 11/09/12 1421 11/08/12 1732  WBC 5.7 8.0 --  NEUTROABS -- -- 4.3  HGB 10.2* 10.3* --  HCT 33.6* 35.0* --  MCV 75.2* 76.1* --  PLT 348 359 --   Cardiac Enzymes:  Basename 11/08/12 1732  CKTOTAL --  CKMB --  CKMBINDEX --  TROPONINI <0.30   BNP:  Basename 11/08/12 1732  PROBNP 993.1*   D-Dimer: No results found for this basename: DDIMER:2 in the last 72  hours CBG:  Basename 11/10/12 0726 11/09/12 2136 11/09/12 1630 11/09/12 1112 11/09/12 0750  GLUCAP 210* 350* 192* 241* 247*   Hemoglobin A1C:  Basename 11/08/12 1732  HGBA1C 7.1*   Fasting Lipid Panel: No results found for this basename: CHOL,HDL,LDLCALC,TRIG,CHOLHDL,LDLDIRECT in the last 72 hours Thyroid Function Tests:  Basename 11/08/12 1732  TSH 1.635  T4TOTAL --  FREET4 --  T3FREE --  THYROIDAB --   Anemia Panel:  Basename 11/08/12 1732  VITAMINB12 1148*  FOLATE 16.9  FERRITIN 14*  TIBC 450*  IRON 19*  RETICCTPCT 1.2   Coagulation:  Basename 11/10/12 0536  LABPROT 18.8*  INR 1.63*   Urine Drug Screen: Drugs of Abuse  No results found for this basename: labopia, cocainscrnur, labbenz, amphetmu, thcu, labbarb    Alcohol Level: No results found for this basename: ETH:2 in the last 72 hours Urinalysis:  Basename 11/09/12 0517  COLORURINE YELLOW  LABSPEC 1.020  PHURINE 5.5  GLUCOSEU 100*  HGBUR SMALL*  BILIRUBINUR NEGATIVE  KETONESUR NEGATIVE  PROTEINUR NEGATIVE  UROBILINOGEN 0.2  NITRITE NEGATIVE  LEUKOCYTESUR NEGATIVE   Misc. Labs:  ABGS No results found for this basename: PHART,PCO2,PO2ART,TCO2,HCO3 in the last 72 hours CULTURES No results found for this or any previous visit (from the past 240 hour(s)). Studies/Results: Ct Chest Wo Contrast  11/08/2012  *RADIOLOGY REPORT*  Clinical Data: Shortness of  breath.  Atrial fibrillation, lung cancer.  CT CHEST WITHOUT CONTRAST  Technique:  Multidetector CT imaging of the chest was performed following the standard protocol without IV contrast.  Comparison: 11/08/2012 radiograph, 08/28/2012 CT  Findings: Post treatment changes involving the right chest. Emphysematous change.  Small right pleural effusion is similar to prior.  Aortic ascending ectasia, tapers to a normal caliber. Scattered atherosclerotic calcification.  Further vascular evaluation not possible without intravenous contrast.  Heart size  upper normal.  Coronary artery calcification.  No pericardial effusion.  Right hilar soft tissue attenuation/radiation changes are similar to prior.  No new hilar process or adenopathy.  Limited images through the upper abdomen are degraded by respiratory motion.  There is a probable cyst exophytic from the upper posterior right kidney.  Narrowed right upper lobe bronchi are similar to prior.  Otherwise, central airways are patent.  There is bilateral sub centimeter nodularity within the lower lobes that has developed in the interval. This is superimposed on left greater right posterior lung base opacities, similar to mildly more prominent in the interval.  Motion degrades osseous evaluation.  No definite acute osseous finding.  IMPRESSION: Post-treatment changes involving the right lung are similar to recent prior.  There is a new bilateral lower lobe nodularity which can be infectious, aspiration, or less likely (though not excluded) metastatic disease.  Recommend short-term chest CT follow-up.   Original Report Authenticated By: Jearld Lesch, M.D.    Dg Chest Portable 1 View  11/08/2012  *RADIOLOGY REPORT*  Clinical Data: Shortness of breath.  History of lung cancer.  PORTABLE CHEST - 1 VIEW  Comparison: 08/28/2012 and CT scan of 08/28/2012  Findings: There is a new vague area of haziness at the left base laterally which may represent early infiltrate.  Lungs are hyperinflated consistent with emphysema.  Scarring and retraction of the hilar structures on the right.  Scarring at the right lung base and in the right upper lung zone including vague nodularity. Chronic prominence of the pulmonary arteries.  IMPRESSION: New area of faint haziness at the left lung base which may represent an early infiltrate.   Original Report Authenticated By: Francene Boyers, M.D.     Medications:  Scheduled:   . azithromycin  500 mg Intravenous Q24H  . cefTRIAXone (ROCEPHIN)  IV  1 g Intravenous Q24H  . diltiazem  90  mg Oral QID  . fluticasone  1 spray Each Nare Daily  . furosemide  20 mg Oral Daily  . insulin aspart  0-5 Units Subcutaneous QHS  . insulin aspart  0-9 Units Subcutaneous TID WC  . insulin glargine  10 Units Subcutaneous BID  . ipratropium  0.5 mg Nebulization Q6H  . iron polysaccharides  150 mg Oral Daily  . levalbuterol  1.25 mg Nebulization Q6H  . metFORMIN  1,000 mg Oral Q breakfast  . methylPREDNISolone (SOLU-MEDROL) injection  60 mg Intravenous Q8H  . mometasone-formoterol  2 puff Inhalation BID  . pantoprazole  40 mg Oral Daily  . Rivaroxaban  15 mg Oral Daily  . roflumilast  500 mcg Oral Daily  . simvastatin  40 mg Oral QPM  . sodium chloride  3 mL Intravenous Q12H  . Tamsulosin HCl  0.8 mg Oral QPC supper   Continuous:   . sodium chloride     NFA:[id number], HYDROmorphone (DILAUDID) injection, magnesium hydroxide, ondansetron (ZOFRAN) IV, ondansetron, oxyCODONE, traZODone  Assesment: He is admitted with COPD exacerbation and pneumonia. He has pretty severe COPD with chronic respiratory failure.  He has a history of lung cancer which seems to be in remission he was anemic which I think also added to his problems with shortness of breath. He has improved. Principal Problem:  *COPD with exacerbation Active Problems:  Iron deficiency anemia  DM type 2 (diabetes mellitus, type 2)  History of lung cancer  Atrial flutter  CKD (chronic kidney disease), stage III  Anemia due to chronic blood loss  Chronic respiratory failure with hypoxia  Pulmonary nodules  Pneumonia    Plan: No change in treatments.    LOS: 2 days   Brendan Hall 11/10/2012, 8:44 AM

## 2012-11-10 NOTE — Clinical Documentation Improvement (Signed)
CHF DOCUMENTATION CLARIFICATION QUERY  THIS DOCUMENT IS NOT A PERMANENT PART OF THE MEDICAL RECORD  TO RESPOND TO THE THIS QUERY, FOLLOW THE INSTRUCTIONS BELOW:  1. If needed, update documentation for the patient's encounter via the notes activity.  2. Access this query again and click edit on the In [employer].  3. After updating, or not, click F2 to complete all highlighted (required) fields concerning your review. Select "additional documentation in the medical record" OR "no additional documentation provided".  4. Click Sign note button.  5. The deficiency will fall out of your In Basket *Please let us know if you are not able to complete this workflow by phone or e-mail (listed below).  Please update your documentation within the medical record to reflect your response to this query.                                                                                    11/10/12  Dear Dr. Sherrie Mustache / [facility],  In a better effort to capture your patient's severity of illness/SOI, risk of mortality/ROM, reflect appropriate length of stay and utilization of resources, a review of the patient medical record has revealed the following indicators the diagnosis of Heart Failure.   PLEASE CLARIFY IN NOTES/DC SUMMARY TERM "MILD CHF" WITH ACUITY AND TYPE. THANK YOU.  Possible Clinical Conditions? Marland Kitchen Acute  Diastolic Congestive Heart Failure . Acute  on Chronic Diastolic Congestive Heart Failure . Other Condition   Supporting Information: - Risk Factors: Afib RVR rate 144, Chronic Blood Loss Anemia requiring 2u PRBC, hx Diastolic CHF, Chronic respiratory failure with hypoxia - Signs & Symptoms:  12/17: mild respiratory distress  - BNP: 12/15: PROBNP 993.1 - Meds: Cardizem IV - Diuretics: Lasix IV   Reviewed:  no additional documentation provided  Thank You,  Beverley Fiedler RN BS Clinical Documentation Specialist: Tele:  [phone] Health Information Management [place]

## 2012-11-11 DIAGNOSIS — J9600 Acute respiratory failure, unspecified whether with hypoxia or hypercapnia: Secondary | ICD-10-CM

## 2012-11-11 LAB — BASIC METABOLIC PANEL
GFR calc Af Amer: 50 mL/min — ABNORMAL LOW (ref >90)
GFR calc non Af Amer: 43 mL/min — ABNORMAL LOW (ref >90)
Glucose, Bld: 292 mg/dL — ABNORMAL HIGH (ref 70–99)
Potassium: 4.6 mEq/L (ref 3.5–5.1)
Sodium: 135 mEq/L (ref 135–145)

## 2012-11-11 LAB — GLUCOSE, CAPILLARY
Glucose-Capillary: 273 mg/dL — ABNORMAL HIGH (ref 70–99)
Glucose-Capillary: 447 mg/dL — ABNORMAL HIGH (ref 70–99)

## 2012-11-11 LAB — CBC
Hemoglobin: 9.8 g/dL — ABNORMAL LOW (ref 13.0–17.0)
MCHC: 29.8 g/dL — ABNORMAL LOW (ref 30.0–36.0)
Platelets: 354 10*3/uL (ref 150–400)
RDW: 18.7 % — ABNORMAL HIGH (ref 11.5–15.5)

## 2012-11-11 MED ORDER — INSULIN ASPART 100 UNIT/ML ~~LOC~~ SOLN
10.0000 [IU] | Freq: Once | SUBCUTANEOUS | Status: AC
  Administered 2012-11-11: 10 [IU] via SUBCUTANEOUS

## 2012-11-11 MED ORDER — AMIODARONE HCL 200 MG PO TABS
400.0000 mg | ORAL_TABLET | Freq: Two times a day (BID) | ORAL | Status: DC
  Administered 2012-11-11 – 2012-11-13 (×4): 400 mg via ORAL
  Filled 2012-11-11 (×4): qty 2

## 2012-11-11 MED ORDER — INSULIN ASPART 100 UNIT/ML ~~LOC~~ SOLN
20.0000 [IU] | Freq: Once | SUBCUTANEOUS | Status: AC
  Administered 2012-11-11: 20 [IU] via SUBCUTANEOUS

## 2012-11-11 MED ORDER — INSULIN GLARGINE 100 UNIT/ML ~~LOC~~ SOLN
20.0000 [IU] | Freq: Two times a day (BID) | SUBCUTANEOUS | Status: DC
  Administered 2012-11-12 – 2012-11-13 (×3): 20 [IU] via SUBCUTANEOUS

## 2012-11-11 MED ORDER — INSULIN GLARGINE 100 UNIT/ML ~~LOC~~ SOLN
15.0000 [IU] | Freq: Two times a day (BID) | SUBCUTANEOUS | Status: DC
  Administered 2012-11-11: 15 [IU] via SUBCUTANEOUS

## 2012-11-11 NOTE — Progress Notes (Signed)
Blood glucose 426, Dr. Kerry Hough notified, new orders received.

## 2012-11-11 NOTE — Progress Notes (Addendum)
Patient ID: Brendan Hall, male   DOB: [birth year], 78 y.o.   MRN: [id number] Continues to be SOB. Sitting in chair.  Slept in recliner last night due to breathing. 02 going. Sats 95-98 on 02. Denies seeing any rectal bleeding. Heart rate tachy at 116 Filed Vitals:   11/11/12 1036 11/11/12 1040 11/11/12 1304 11/11/12 1332  BP: 127/80   107/55  Pulse: 116 116    Temp:      TempSrc:      Resp:      Height:      Weight:      SpO2:   98%    CBC    Component Value Date/Time   WBC 7.7 11/11/2012 0527   RBC 4.40 11/11/2012 0527   HGB 9.8* 11/11/2012 0527   HCT 32.9* 11/11/2012 0527   PLT 354 11/11/2012 0527   MCV 74.8* 11/11/2012 0527   MCH 22.3* 11/11/2012 0527   MCHC 29.8* 11/11/2012 0527   RDW 18.7* 11/11/2012 0527   LYMPHSABS 1.1 11/08/2012 1732   MONOABS 0.9 11/08/2012 1732   EOSABS 0.1 11/08/2012 1732   BASOSABS 0.0 11/08/2012 1732   Will continue to monitor. Patient continues to be extremely SOB.   GI attending note; Patient remains with dyspnea at rest. He remains with tachyarrhythmia; he was begun on amiodarone by Dr. Dietrich Pates. No evidence of overt GI bleed. Will check CBC in a.m. EGD later this week when he is deemed to be medically stable

## 2012-11-11 NOTE — Progress Notes (Signed)
UR Chart Review Completed  

## 2012-11-11 NOTE — Progress Notes (Signed)
Subjective: He says he feels better. He's not coughing as much and he is not as short of breath.  Objective: Vital signs in last 24 hours: Temp:  [97.7 F (36.5 C)-97.9 F (36.6 C)] 97.7 F (36.5 C) (12/18 0624) Pulse Rate:  [66-140] 66  (12/18 0624) Resp:  [20-21] 20  (12/18 0624) BP: (114-142)/(66-83) 134/68 mmHg (12/18 0624) SpO2:  [92 %-100 %] 98 % (12/18 0817) Weight change:  Last BM Date: 11/10/12  Intake/Output from previous day: 12/17 0701 - 12/18 0700 In: 690 [P.O.:480; I.V.:160; IV Piggyback:50] Out: 1200 [Urine:1200]  PHYSICAL EXAM General appearance: alert, cooperative and no distress Resp: clear to auscultation bilaterally Cardio: regular rate and rhythm, S1, S2 normal, no murmur, click, rub or gallop GI: soft, non-tender; bowel sounds normal; no masses,  no organomegaly Extremities: extremities normal, atraumatic, no cyanosis or edema  Lab Results:    Basic Metabolic Panel:  Basename 11/11/12 0527 11/10/12 0536 11/08/12 1732  NA 135 139 --  K 4.6 4.6 --  CL 100 103 --  CO2 25 26 --  GLUCOSE 292* 249* --  BUN 36* 29* --  CREATININE 1.48* 1.45* --  CALCIUM 9.7 9.7 --  MG -- -- 1.8  PHOS -- -- --   Liver Function Tests:  [facility] 11/08/12 1732  AST 16  ALT 14  ALKPHOS 68  BILITOT 0.3  PROT 7.6  ALBUMIN 3.4*   No results found for this basename: LIPASE:2,AMYLASE:2 in the last 72 hours No results found for this basename: AMMONIA:2 in the last 72 hours CBC:  Basename 11/11/12 0527 11/10/12 0536 11/08/12 1732  WBC 7.7 5.7 --  NEUTROABS -- -- 4.3  HGB 9.8* 10.2* --  HCT 32.9* 33.6* --  MCV 74.8* 75.2* --  PLT 354 348 --   Cardiac Enzymes:  Basename 11/08/12 1732  CKTOTAL --  CKMB --  CKMBINDEX --  TROPONINI <0.30   BNP:  Basename 11/08/12 1732  PROBNP 993.1*   D-Dimer: No results found for this basename: DDIMER:2 in the last 72 hours CBG:  Basename 11/11/12 0751 11/10/12 2138 11/10/12 1628 11/10/12 1120 11/10/12 0900 11/10/12  0726  GLUCAP 273* 227* 368* 212* 247* 210*   Hemoglobin A1C:  Basename 11/08/12 1732  HGBA1C 7.1*   Fasting Lipid Panel: No results found for this basename: CHOL,HDL,LDLCALC,TRIG,CHOLHDL,LDLDIRECT in the last 72 hours Thyroid Function Tests:  Basename 11/08/12 1732  TSH 1.635  T4TOTAL --  FREET4 --  T3FREE --  THYROIDAB --   Anemia Panel:  Basename 11/08/12 1732  VITAMINB12 1148*  FOLATE 16.9  FERRITIN 14*  TIBC 450*  IRON 19*  RETICCTPCT 1.2   Coagulation:  Basename 11/10/12 0536  LABPROT 18.8*  INR 1.63*   Urine Drug Screen: Drugs of Abuse  No results found for this basename: labopia, cocainscrnur, labbenz, amphetmu, thcu, labbarb    Alcohol Level: No results found for this basename: ETH:2 in the last 72 hours Urinalysis:  Basename 11/09/12 0517  COLORURINE YELLOW  LABSPEC 1.020  PHURINE 5.5  GLUCOSEU 100*  HGBUR SMALL*  BILIRUBINUR NEGATIVE  KETONESUR NEGATIVE  PROTEINUR NEGATIVE  UROBILINOGEN 0.2  NITRITE NEGATIVE  LEUKOCYTESUR NEGATIVE   Misc. Labs:  ABGS No results found for this basename: PHART,PCO2,PO2ART,TCO2,HCO3 in the last 72 hours CULTURES No results found for this or any previous visit (from the past 240 hour(s)). Studies/Results: No results found.  Medications:  Prior to Admission:  Prescriptions prior to admission  Medication Sig Dispense Refill  . cyanocobalamin 1000 MCG tablet Take 1,000  mcg by mouth daily.      Marland Kitchen diltiazem (CARDIZEM CD) 180 MG 24 hr capsule Take 1 capsule (180 mg total) by mouth 2 (two) times daily. New medication for your heart rate and your blood pressure.  60 capsule  3  . Fluticasone-Salmeterol (ADVAIR) 250-50 MCG/DOSE AEPB Inhale 1 puff into the lungs every 12 (twelve) hours.        . furosemide (LASIX) 40 MG tablet Take 20 mg by mouth daily.      Marland Kitchen glyBURIDE (DIABETA) 5 MG tablet Take 5 mg by mouth daily.      Marland Kitchen ipratropium-albuterol (DUONEB) 0.5-2.5 (3) MG/3ML SOLN Take 3 mLs by nebulization 4 (four)  times daily.      . iron polysaccharides (NIFEREX) 150 MG capsule Take 150 mg by mouth daily. Iron supplementation for your anemia.      . metFORMIN (GLUCOPHAGE) 1000 MG tablet Take 1,000 mg by mouth daily with breakfast.       . omeprazole (PRILOSEC) 20 MG capsule Take 20 mg by mouth daily.       . Rivaroxaban (XARELTO) 15 MG TABS tablet Take 15 mg by mouth daily.      . roflumilast (DALIRESP) 500 MCG TABS tablet Take 500 mcg by mouth daily.      . simvastatin (ZOCOR) 40 MG tablet Take 40 mg by mouth daily.       . Tamsulosin HCl (FLOMAX) 0.4 MG CAPS Take 0.8 mg by mouth daily after supper.      . tiotropium (SPIRIVA) 18 MCG inhalation capsule Place 18 mcg into inhaler and inhale daily.      Marland Kitchen albuterol (VENTOLIN HFA) 108 (90 BASE) MCG/ACT inhaler Inhale 2 puffs into the lungs every 6 (six) hours as needed. Shortness of breath      . mometasone (NASONEX) 50 MCG/ACT nasal spray Place 2 sprays into the nose daily.       Scheduled:   . antiseptic oral rinse  15 mL Mouth Rinse BID  . azithromycin  500 mg Oral q1800  . cefTRIAXone (ROCEPHIN)  IV  1 g Intravenous Q24H  . digoxin  0.125 mg Oral Daily  . diltiazem  120 mg Oral QID  . fluticasone  1 spray Each Nare Daily  . furosemide  20 mg Oral Daily  . insulin aspart  0-5 Units Subcutaneous QHS  . insulin aspart  0-9 Units Subcutaneous TID WC  . insulin glargine  10 Units Subcutaneous BID  . ipratropium  0.5 mg Nebulization Q6H  . iron polysaccharides  150 mg Oral Daily  . levalbuterol  1.25 mg Nebulization Q6H  . metFORMIN  1,000 mg Oral Q breakfast  . methylPREDNISolone (SOLU-MEDROL) injection  60 mg Intravenous Q8H  . mometasone-formoterol  2 puff Inhalation BID  . pantoprazole  40 mg Oral Daily  . Rivaroxaban  15 mg Oral Daily  . roflumilast  500 mcg Oral Daily  . simvastatin  40 mg Oral QPM  . sodium chloride  3 mL Intravenous Q12H  . Tamsulosin HCl  0.8 mg Oral QPC supper   Continuous:  MVH:QIONGEXBM, HYDROmorphone (DILAUDID)  injection, magnesium hydroxide, ondansetron (ZOFRAN) IV, ondansetron, oxyCODONE, traZODone  Assesment: He has pneumonia and COPD exacerbation but seems to be improving from a pulmonary point of view. His GI workup is underway Principal Problem:  *COPD with exacerbation Active Problems:  Iron deficiency anemia  DM type 2 (diabetes mellitus, type 2)  History of lung cancer  Atrial flutter  CKD (chronic kidney disease), stage  III  Anemia due to chronic blood loss  Chronic respiratory failure with hypoxia  Pulmonary nodules  Pneumonia    Plan: No change in treatments. I think he is approaching baseline as far as his respiratory status is concerned    LOS: 3 days   Brendan Hall 11/11/2012, 8:55 AM

## 2012-11-11 NOTE — Progress Notes (Signed)
Subjective:  Breathing "ok", heart still racing. He has marked orthopnea, which is another problem that contributed to inability to perform an upper endoscopy.  Objective:  Vital Signs in the last 24 hours: Temp:  [97.7 F (36.5 C)-97.9 F (36.6 C)] 97.7 F (36.5 C) (12/18 0624) Pulse Rate:  [66-140] 66  (12/18 0624) Resp:  [20] 20  (12/18 0624) BP: (114-136)/(66-80) 134/68 mmHg (12/18 0624) SpO2:  [92 %-98 %] 98 % (12/18 0817)  Intake/Output from previous day: 12/17 0701 - 12/18 0700 In: 690 [P.O.:480; I.V.:160; IV Piggyback:50] Out: 1200 [Urine:1200]   Physical Exam: NECK: Without JVD, HJR, or bruit LUNGS: Decreased breath sounds throughout HEART: Irregular rate and rhythm at 120/m, distant heart sounds.  Modest systolic ejection murmur; no, gallop, rub, bruit, thrill, or heave noted EXTREMITIES: Without cyanosis, clubbing, or edema  Lab Results:  [facility] 11/11/12 0527 11/10/12 0536  WBC 7.7 5.7  HGB 9.8* 10.2*  PLT 354 348    Basename 11/11/12 0527 11/10/12 0536  NA 135 139  K 4.6 4.6  CL 100 103  CO2 25 26  GLUCOSE 292* 249*  BUN 36* 29*  CREATININE 1.48* 1.45*    Basename 11/08/12 1732  TROPONINI <0.30   Hepatic Function Panel  Basename 11/08/12 1732  PROT 7.6  ALBUMIN 3.4*  AST 16  ALT 14  ALKPHOS 68  BILITOT 0.3  BILIDIR 0.1  IBILI 0.2*   Assessment/Plan:  COPD exacerbation: This appears to be his main medical issue. I not certain that atrial flutter ablation will provide him with much lasting benefit, as his underlying lung problems we'll persist and are the principal contributor to his symptoms.  Iron deficiency anemia: Endoscopy canceled as a result of increased heart rate and orthopnea.  Atrial flutter: Long-standing problem; we will adjust rate medication, but anxiety and physiologic stress of lung disease are rendering a rate control strategy difficult  Will discuss with Dr. Dietrich Pates.  Renal insufficiency: Creatinine up to 1.48  Brendan Hall 11/11/2012, 9:35 AM  Cardiology Attending Patient interviewed and examined. Discussed with Brendan Reedy, PA-C.  Above note annotated and modified based upon my findings.  High-dose diltiazem plus appropriate dose of digoxin has not resulted in adequate control of heart rate. Since COPD as his major medical problem, I'm not inclined to add beta blocker, a potentially dangerous strategy.  It may be difficult to achieve adequate compensation of COPD to allow AV node ablation to proceed without resorting to general anesthesia and intubation. I will try adding amiodarone to his current regimen with the main goal of controlling heart rate, but with a possible ancillary benefit of chemical cardioversion.  Brendan Bing, MD 11/11/2012, 4:31 PM

## 2012-11-11 NOTE — Progress Notes (Signed)
Inpatient Diabetes Program Recommendations  AACE/ADA: New Consensus Statement on Inpatient Glycemic Control (2013)  Target Ranges:  Prepandial:   less than 140 mg/dL      Peak postprandial:   less than 180 mg/dL (1-2 hours)      Critically ill patients:  140 - 180 mg/dL     Inpatient Diabetes Program Recommendations Insulin - Basal: Please consider increasing Lantus to 15 units BID. Correction (SSI): Please consider increasing correction scale to Novolog moderate scale.  Note: Please consider increasing Lantus to 15 units BID and increasing correction scale to moderate since blood glucose is consistently elevated ranging from 210-268 mg/dl over the past 24 hours.  Will continue to follow.  Thanks, Orlando Penner, RN, BSN, CCRN Diabetes Coordinator Inpatient Diabetes Program [phone]

## 2012-11-11 NOTE — Progress Notes (Signed)
[facility]             Progress Note   Subjective: Does not feel that his breathing has returned to baseline.  Still experiencing palpitations.  Objective: Vital signs in last 24 hours: Temp:  [97.7 F (36.5 C)-97.9 F (36.6 C)] 97.7 F (36.5 C) (12/18 0624) Pulse Rate:  [66-140] 116  (12/18 1040) Resp:  [20] 20  (12/18 0624) BP: (114-136)/(66-80) 127/80 mmHg (12/18 1036) SpO2:  [92 %-98 %] 98 % (12/18 0817) Weight change:  Last BM Date: 11/11/12  Intake/Output from previous day: 12/17 0701 - 12/18 0700 In: 690 [P.O.:480; I.V.:160; IV Piggyback:50] Out: 1200 [Urine:1200]     Physical Exam: General: Alert, awake, oriented x3, in no acute distress. HEENT: No bruits, no goiter. Heart: Irregular, tachycardic Lungs: Clear to auscultation bilaterally. Abdomen: Soft, nontender, nondistended, positive bowel sounds. Extremities: 1+ pedal edema bilaterally Neuro: Grossly intact, nonfocal.    Lab Results: Basic Metabolic Panel:  Basename 11/11/12 0527 11/10/12 0536 11/08/12 1732  NA 135 139 --  K 4.6 4.6 --  CL 100 103 --  CO2 25 26 --  GLUCOSE 292* 249* --  BUN 36* 29* --  CREATININE 1.48* 1.45* --  CALCIUM 9.7 9.7 --  MG -- -- 1.8  PHOS -- -- --   Liver Function Tests:  Basename 11/08/12 1732  AST 16  ALT 14  ALKPHOS 68  BILITOT 0.3  PROT 7.6  ALBUMIN 3.4*   No results found for this basename: LIPASE:2,AMYLASE:2 in the last 72 hours No results found for this basename: AMMONIA:2 in the last 72 hours CBC:  Basename 11/11/12 0527 11/10/12 0536 11/08/12 1732  WBC 7.7 5.7 --  NEUTROABS -- -- 4.3  HGB 9.8* 10.2* --  HCT 32.9* 33.6* --  MCV 74.8* 75.2* --  PLT 354 348 --   Cardiac Enzymes:  Basename 11/08/12 1732  CKTOTAL --  CKMB --  CKMBINDEX --  TROPONINI <0.30   BNP:  [facility] 11/08/12 1732  PROBNP 993.1*   D-Dimer: No results found for this basename: DDIMER:2 in the last 72 hours CBG:  Basename 11/11/12 1124 11/11/12  0751 11/10/12 2138 11/10/12 1628 11/10/12 1120 11/10/12 0900  GLUCAP 260* 273* 227* 368* 212* 247*   Hemoglobin A1C:  Basename 11/08/12 1732  HGBA1C 7.1*   Fasting Lipid Panel: No results found for this basename: CHOL,HDL,LDLCALC,TRIG,CHOLHDL,LDLDIRECT in the last 72 hours Thyroid Function Tests:  Basename 11/08/12 1732  TSH 1.635  T4TOTAL --  FREET4 --  T3FREE --  THYROIDAB --   Anemia Panel:  Basename 11/08/12 1732  VITAMINB12 1148*  FOLATE 16.9  FERRITIN 14*  TIBC 450*  IRON 19*  RETICCTPCT 1.2   Coagulation:  Basename 11/10/12 0536  LABPROT 18.8*  INR 1.63*   Urine Drug Screen: Drugs of Abuse  No results found for this basename: labopia,  cocainscrnur,  labbenz,  amphetmu,  thcu,  labbarb    Alcohol Level: No results found for this basename: ETH:2 in the last 72 hours Urinalysis:  Basename 11/09/12 0517  COLORURINE YELLOW  LABSPEC 1.020  PHURINE 5.5  GLUCOSEU 100*  HGBUR SMALL*  BILIRUBINUR NEGATIVE  KETONESUR NEGATIVE  PROTEINUR NEGATIVE  UROBILINOGEN 0.2  NITRITE NEGATIVE  LEUKOCYTESUR NEGATIVE   No results found for this or any previous visit (from the past 240 hour(s)).  Studies/Results: No results found.  Medications: Scheduled Meds:    . antiseptic oral rinse  15 mL Mouth Rinse BID  . azithromycin  500 mg Oral q1800  .  cefTRIAXone (ROCEPHIN)  IV  1 g Intravenous Q24H  . digoxin  0.125 mg Oral Daily  . diltiazem  120 mg Oral QID  . fluticasone  1 spray Each Nare Daily  . furosemide  20 mg Oral Daily  . insulin aspart  0-5 Units Subcutaneous QHS  . insulin aspart  0-9 Units Subcutaneous TID WC  . insulin glargine  10 Units Subcutaneous BID  . ipratropium  0.5 mg Nebulization Q6H  . iron polysaccharides  150 mg Oral Daily  . levalbuterol  1.25 mg Nebulization Q6H  . metFORMIN  1,000 mg Oral Q breakfast  . methylPREDNISolone (SOLU-MEDROL) injection  60 mg Intravenous Q8H  . mometasone-formoterol  2 puff Inhalation BID  .  pantoprazole  40 mg Oral Daily  . Rivaroxaban  15 mg Oral Daily  . roflumilast  500 mcg Oral Daily  . simvastatin  40 mg Oral QPM  . sodium chloride  3 mL Intravenous Q12H  . Tamsulosin HCl  0.8 mg Oral QPC supper   Continuous Infusions:  PRN Meds:.bisacodyl, HYDROmorphone (DILAUDID) injection, magnesium hydroxide, ondansetron (ZOFRAN) IV, ondansetron, oxyCODONE, traZODone  Assessment/Plan:  Principal Problem:  *COPD with exacerbation Active Problems:  Iron deficiency anemia  DM type 2 (diabetes mellitus, type 2)  History of lung cancer  Atrial flutter  CKD (chronic kidney disease), stage III  Anemia due to chronic blood loss  Chronic respiratory failure with hypoxia  Pulmonary nodules  Pneumonia  .Acute on chronic dyspnea. Etiology likely multifactorial. See below. Improving  Acute on Chronic hypoxic respiratory failure secondary to COPD/COPD with exacerbation. The patient chronically uses 3 L of oxygen at home. We'll continue bronchodilator therapy with Dulera, Xopenex nebulizer, and Atrovent nebulizer. Continue oxygen at 2-3 L a minute. Will restart antibiotics and steroid. Appreciate Dr. Juanetta Gosling assistance. He appears to have severe COPD and very poor functional capacity at baseline. Patient becomes exhausted when walking from one room to another in his house. He may likely be approaching his baseline respiratory status. Continue current treatments   Pulmonary nodularity. We'll treat this as community-acquired pneumonia. However, in the setting of history of lung cancer, the patient may need further imaging with a PET scan electively.   Atrial flutter with RVR. Heart rate remains uncontrolled, mostly in the 140s. Patient is on max dose of diltiazem. He is scheduled for what sounds to be DC cardioversion later in the week. Per cardiology, this will be postponed. He is anticoagulated with the Xarelto. Cardiology is following. He has been started on digoxin but heart rate remains  uncontrolled.  Can possibly add amiodarone.  Will defer to cardiology.  Microcytic, iron deficiency anemia, presumed to be secondary to chronic blood loss; in the setting of chronic anticoagulation. Status post 2 units of packed red blood cell transfusion. The patient has a history of diverticulosis per colonoscopy in October 2012. He is on Protonix empirically. GI his been consulted and was planning on EGD, but this procedure was canceled due to the patient's tachyarrhythmia. He will need EGD once his heart rate is under better control.   Chronic kidney disease. Currently stable.   Query diastolic dysfunction. Echocardiogram in June 2013 could not evaluate for diastolic dysfunction. The patient was given a total of 60 mg of Lasix following the blood transfusions.   Time spent coordinating care:   LOS: 3 days   MEMON,JEHANZEB [facility] Pager: [phone] 11/11/2012, 12:54 PM

## 2012-11-12 ENCOUNTER — Encounter (HOSPITAL_COMMUNITY)
Admission: EM | Disposition: A | Discharge status: Home Health | Payer: Self-pay | Source: Home / Self Care | Attending: Internal Medicine

## 2012-11-12 ENCOUNTER — Ambulatory Visit (HOSPITAL_COMMUNITY): Admission: RE | Admit: 2012-11-12 | Payer: Medicare Other | Source: Ambulatory Visit | Admitting: Internal Medicine

## 2012-11-12 LAB — CBC
MCH: 22 pg — ABNORMAL LOW (ref 26.0–34.0)
MCHC: 29.2 g/dL — ABNORMAL LOW (ref 30.0–36.0)
Platelets: 324 10*3/uL (ref 150–400)
RBC: 4.19 MIL/uL — ABNORMAL LOW (ref 4.22–5.81)

## 2012-11-12 LAB — GLUCOSE, CAPILLARY: Glucose-Capillary: 358 mg/dL — ABNORMAL HIGH (ref 70–99)

## 2012-11-12 SURGERY — ATRIAL FLUTTER ABLATION
Anesthesia: LOCAL

## 2012-11-12 MED ORDER — INSULIN ASPART 100 UNIT/ML ~~LOC~~ SOLN
0.0000 [IU] | Freq: Every day | SUBCUTANEOUS | Status: DC
  Administered 2012-11-12 – 2012-11-13 (×2): 2 [IU] via SUBCUTANEOUS

## 2012-11-12 MED ORDER — INSULIN ASPART 100 UNIT/ML ~~LOC~~ SOLN
0.0000 [IU] | Freq: Three times a day (TID) | SUBCUTANEOUS | Status: DC
  Administered 2012-11-12: 11 [IU] via SUBCUTANEOUS
  Administered 2012-11-12: 15 [IU] via SUBCUTANEOUS
  Administered 2012-11-13 (×2): 20 [IU] via SUBCUTANEOUS
  Administered 2012-11-13: 15 [IU] via SUBCUTANEOUS
  Administered 2012-11-14 (×2): 7 [IU] via SUBCUTANEOUS

## 2012-11-12 MED ORDER — METHYLPREDNISOLONE SODIUM SUCC 125 MG IJ SOLR
60.0000 mg | Freq: Two times a day (BID) | INTRAMUSCULAR | Status: DC
  Administered 2012-11-12 – 2012-11-14 (×4): 60 mg via INTRAVENOUS
  Filled 2012-11-12 (×4): qty 2

## 2012-11-12 NOTE — Progress Notes (Signed)
Subjective: He says he feels about the same. He has no new complaints. He says his breathing is about back to baseline.  Objective: Vital signs in last 24 hours: Temp:  [97.3 F (36.3 C)-97.9 F (36.6 C)] 97.6 F (36.4 C) (12/19 0544) Pulse Rate:  [103-129] 114  (12/19 0544) Resp:  [20] 20  (12/19 0544) BP: (100-127)/(48-80) 100/48 mmHg (12/19 0544) SpO2:  [92 %-98 %] 94 % (12/19 0544) Weight:  [75.3 kg (166 lb 0.1 oz)] 75.3 kg (166 lb 0.1 oz) (12/19 0500) Weight change:  Last BM Date: 11/11/12  Intake/Output from previous day: 12/18 0701 - 12/19 0700 In: 650 [P.O.:600; IV Piggyback:50] Out: 900 [Urine:900]  PHYSICAL EXAM General appearance: alert, cooperative and no distress Resp: clear to auscultation bilaterally Cardio: irregularly irregular rhythm GI: soft, non-tender; bowel sounds normal; no masses,  no organomegaly Extremities: extremities normal, atraumatic, no cyanosis or edema  Lab Results:    Basic Metabolic Panel:  Basename 11/11/12 1851 11/11/12 0527 11/10/12 0536  NA -- 135 139  K -- 4.6 4.6  CL -- 100 103  CO2 -- 25 26  GLUCOSE 478* 292* --  BUN -- 36* 29*  CREATININE -- 1.48* 1.45*  CALCIUM -- 9.7 9.7  MG -- -- --  PHOS -- -- --   Liver Function Tests: No results found for this basename: AST:2,ALT:2,ALKPHOS:2,BILITOT:2,PROT:2,ALBUMIN:2 in the last 72 hours No results found for this basename: LIPASE:2,AMYLASE:2 in the last 72 hours No results found for this basename: AMMONIA:2 in the last 72 hours CBC:  Basename 11/12/12 0525 11/11/12 0527  WBC 7.1 7.7  NEUTROABS -- --  HGB 9.2* 9.8*  HCT 31.5* 32.9*  MCV 75.2* 74.8*  PLT 324 354   Cardiac Enzymes: No results found for this basename: CKTOTAL:3,CKMB:3,CKMBINDEX:3,TROPONINI:3 in the last 72 hours BNP: No results found for this basename: PROBNP:3 in the last 72 hours D-Dimer: No results found for this basename: DDIMER:2 in the last 72 hours CBG:  Basename 11/12/12 0710 11/11/12 2049  11/11/12 1658 11/11/12 1124 11/11/12 0751 11/10/12 2138  GLUCAP 358* 447* 426* 260* 273* 227*   Hemoglobin A1C: No results found for this basename: HGBA1C in the last 72 hours Fasting Lipid Panel: No results found for this basename: CHOL,HDL,LDLCALC,TRIG,CHOLHDL,LDLDIRECT in the last 72 hours Thyroid Function Tests: No results found for this basename: TSH,T4TOTAL,FREET4,T3FREE,THYROIDAB in the last 72 hours Anemia Panel: No results found for this basename: VITAMINB12,FOLATE,FERRITIN,TIBC,IRON,RETICCTPCT in the last 72 hours Coagulation:  Basename 11/10/12 0536  LABPROT 18.8*  INR 1.63*   Urine Drug Screen: Drugs of Abuse  No results found for this basename: labopia, cocainscrnur, labbenz, amphetmu, thcu, labbarb    Alcohol Level: No results found for this basename: ETH:2 in the last 72 hours Urinalysis: No results found for this basename: COLORURINE:2,APPERANCEUR:2,LABSPEC:2,PHURINE:2,GLUCOSEU:2,HGBUR:2,BILIRUBINUR:2,KETONESUR:2,PROTEINUR:2,UROBILINOGEN:2,NITRITE:2,LEUKOCYTESUR:2 in the last 72 hours Misc. Labs:  ABGS No results found for this basename: PHART,PCO2,PO2ART,TCO2,HCO3 in the last 72 hours CULTURES No results found for this or any previous visit (from the past 240 hour(s)). Studies/Results: No results found.  Medications:  Prior to Admission:  Prescriptions prior to admission  Medication Sig Dispense Refill  . cyanocobalamin 1000 MCG tablet Take 1,000 mcg by mouth daily.      Marland Kitchen diltiazem (CARDIZEM CD) 180 MG 24 hr capsule Take 1 capsule (180 mg total) by mouth 2 (two) times daily. New medication for your heart rate and your blood pressure.  60 capsule  3  . Fluticasone-Salmeterol (ADVAIR) 250-50 MCG/DOSE AEPB Inhale 1 puff into the lungs every 12 (  twelve) hours.        . furosemide (LASIX) 40 MG tablet Take 20 mg by mouth daily.      Marland Kitchen glyBURIDE (DIABETA) 5 MG tablet Take 5 mg by mouth daily.      Marland Kitchen ipratropium-albuterol (DUONEB) 0.5-2.5 (3) MG/3ML SOLN Take 3  mLs by nebulization 4 (four) times daily.      . iron polysaccharides (NIFEREX) 150 MG capsule Take 150 mg by mouth daily. Iron supplementation for your anemia.      . metFORMIN (GLUCOPHAGE) 1000 MG tablet Take 1,000 mg by mouth daily with breakfast.       . omeprazole (PRILOSEC) 20 MG capsule Take 20 mg by mouth daily.       . Rivaroxaban (XARELTO) 15 MG TABS tablet Take 15 mg by mouth daily.      . roflumilast (DALIRESP) 500 MCG TABS tablet Take 500 mcg by mouth daily.      . simvastatin (ZOCOR) 40 MG tablet Take 40 mg by mouth daily.       . Tamsulosin HCl (FLOMAX) 0.4 MG CAPS Take 0.8 mg by mouth daily after supper.      . tiotropium (SPIRIVA) 18 MCG inhalation capsule Place 18 mcg into inhaler and inhale daily.      Marland Kitchen albuterol (VENTOLIN HFA) 108 (90 BASE) MCG/ACT inhaler Inhale 2 puffs into the lungs every 6 (six) hours as needed. Shortness of breath      . mometasone (NASONEX) 50 MCG/ACT nasal spray Place 2 sprays into the nose daily.       Scheduled:   . amiodarone  400 mg Oral BID  . antiseptic oral rinse  15 mL Mouth Rinse BID  . azithromycin  500 mg Oral q1800  . cefTRIAXone (ROCEPHIN)  IV  1 g Intravenous Q24H  . digoxin  0.125 mg Oral Daily  . diltiazem  120 mg Oral QID  . fluticasone  1 spray Each Nare Daily  . furosemide  20 mg Oral Daily  . insulin aspart  0-5 Units Subcutaneous QHS  . insulin aspart  0-9 Units Subcutaneous TID WC  . insulin glargine  20 Units Subcutaneous BID  . ipratropium  0.5 mg Nebulization Q6H  . iron polysaccharides  150 mg Oral Daily  . levalbuterol  1.25 mg Nebulization Q6H  . metFORMIN  1,000 mg Oral Q breakfast  . methylPREDNISolone (SOLU-MEDROL) injection  60 mg Intravenous Q8H  . mometasone-formoterol  2 puff Inhalation BID  . pantoprazole  40 mg Oral Daily  . Rivaroxaban  15 mg Oral Daily  . roflumilast  500 mcg Oral Daily  . simvastatin  40 mg Oral QPM  . sodium chloride  3 mL Intravenous Q12H  . Tamsulosin HCl  0.8 mg Oral QPC  supper   Continuous:  [id number], HYDROmorphone (DILAUDID) injection, magnesium hydroxide, ondansetron (ZOFRAN) IV, ondansetron, oxyCODONE, traZODone  Assesment: He was admitted with pneumonia COPD exacerbation and anemia. He is going to have an EGD recently and having other problems that have prevented that. He is now on amiodarone but his heart rate is still 114 this morning. He says he is breathing okay. Principal Problem:  *COPD with exacerbation Active Problems:  Iron deficiency anemia  DM type 2 (diabetes mellitus, type 2)  History of lung cancer  Atrial flutter  CKD (chronic kidney disease), stage III  Anemia due to chronic blood loss  Chronic respiratory failure with hypoxia  Pulmonary nodules  Pneumonia    Plan: I want to follow him more  peripherally now. I think from a COPD point of view he is back to baseline.    LOS: 4 days   Brendan Hall 11/12/2012, 8:07 AM

## 2012-11-12 NOTE — Progress Notes (Addendum)
Patient ID: Brendan Hall, male   DOB: [birth year], 78 y.o.   MRN: [id number] Says he is not as SOB today. 02 [place] going. Up to BR. Still sitting up in chair.  Pulse up 114. Lung clear, shallow. Filed Vitals:   11/11/12 2037 11/12/12 0500 11/12/12 0544 11/12/12 0650  BP: 106/56  100/48   Pulse: 103  114   Temp: 97.3 F (36.3 C)  97.6 F (36.4 C)   TempSrc: Oral  Oral   Resp: 20  20   Height:      Weight:  166 lb 0.1 oz (75.3 kg)    SpO2: 92%  94% 95%    Will continue to monitor. Possible EGD  tomorrow if H&H drops and further.  GI attending,s note. Patient states that he has able to breathe better. He remains with good appetite. Hemoglobin is 9.2 g today. CBC will be repeated in a.m.

## 2012-11-12 NOTE — Progress Notes (Signed)
UR Chart Review Completed  

## 2012-11-12 NOTE — Progress Notes (Signed)
[facility]             Progress Note   Subjective: He feels breathing is improving.  Denies any palpitations  Objective: Vital signs in last 24 hours: Temp:  [97.3 F (36.3 C)-97.9 F (36.6 C)] 97.6 F (36.4 C) (12/19 0544) Pulse Rate:  [103-129] 114  (12/19 0544) Resp:  [20] 20  (12/19 0544) BP: (100-111)/(48-66) 100/48 mmHg (12/19 0544) SpO2:  [92 %-98 %] 95 % (12/19 0650) Weight:  [75.3 kg (166 lb 0.1 oz)] 75.3 kg (166 lb 0.1 oz) (12/19 0500) Weight change:  Last BM Date: 11/11/12  Intake/Output from previous day: 12/18 0701 - 12/19 0700 In: 650 [P.O.:600; IV Piggyback:50] Out: 900 [Urine:900]     Physical Exam: General: Alert, awake, oriented x3, in no acute distress. HEENT: No bruits, no goiter. Heart: Irregular, tachycardic Lungs: Clear to auscultation bilaterally. Abdomen: Soft, nontender, nondistended, positive bowel sounds. Extremities: 1+ pedal edema bilaterally Neuro: Grossly intact, nonfocal.    Lab Results: Basic Metabolic Panel:  Basename 11/11/12 1851 11/11/12 0527 11/10/12 0536  NA -- 135 139  K -- 4.6 4.6  CL -- 100 103  CO2 -- 25 26  GLUCOSE 478* 292* --  BUN -- 36* 29*  CREATININE -- 1.48* 1.45*  CALCIUM -- 9.7 9.7  MG -- -- --  PHOS -- -- --   Liver Function Tests: No results found for this basename: AST:2,ALT:2,ALKPHOS:2,BILITOT:2,PROT:2,ALBUMIN:2 in the last 72 hours No results found for this basename: LIPASE:2,AMYLASE:2 in the last 72 hours No results found for this basename: AMMONIA:2 in the last 72 hours CBC:  Basename 11/12/12 0525 11/11/12 0527  WBC 7.1 7.7  NEUTROABS -- --  HGB 9.2* 9.8*  HCT 31.5* 32.9*  MCV 75.2* 74.8*  PLT 324 354   Cardiac Enzymes: No results found for this basename: CKTOTAL:3,CKMB:3,CKMBINDEX:3,TROPONINI:3 in the last 72 hours BNP: No results found for this basename: PROBNP:3 in the last 72 hours D-Dimer: No results found for this basename: DDIMER:2 in the last 72  hours CBG:  Basename 11/12/12 0710 11/11/12 2049 11/11/12 1658 11/11/12 1124 11/11/12 0751 11/10/12 2138  GLUCAP 358* 447* 426* 260* 273* 227*   Hemoglobin A1C: No results found for this basename: HGBA1C in the last 72 hours Fasting Lipid Panel: No results found for this basename: CHOL,HDL,LDLCALC,TRIG,CHOLHDL,LDLDIRECT in the last 72 hours Thyroid Function Tests: No results found for this basename: TSH,T4TOTAL,FREET4,T3FREE,THYROIDAB in the last 72 hours Anemia Panel: No results found for this basename: VITAMINB12,FOLATE,FERRITIN,TIBC,IRON,RETICCTPCT in the last 72 hours Coagulation:  Basename 11/10/12 0536  LABPROT 18.8*  INR 1.63*   Urine Drug Screen: Drugs of Abuse  No results found for this basename: labopia,  cocainscrnur,  labbenz,  amphetmu,  thcu,  labbarb    Alcohol Level: No results found for this basename: ETH:2 in the last 72 hours Urinalysis: No results found for this basename: COLORURINE:2,APPERANCEUR:2,LABSPEC:2,PHURINE:2,GLUCOSEU:2,HGBUR:2,BILIRUBINUR:2,KETONESUR:2,PROTEINUR:2,UROBILINOGEN:2,NITRITE:2,LEUKOCYTESUR:2 in the last 72 hours No results found for this or any previous visit (from the past 240 hour(s)).  Studies/Results: No results found.  Medications: Scheduled Meds:    . amiodarone  400 mg Oral BID  . antiseptic oral rinse  15 mL Mouth Rinse BID  . azithromycin  500 mg Oral q1800  . cefTRIAXone (ROCEPHIN)  IV  1 g Intravenous Q24H  . digoxin  0.125 mg Oral Daily  . diltiazem  120 mg Oral QID  . fluticasone  1 spray Each Nare Daily  . furosemide  20 mg Oral Daily  . insulin aspart  0-5 Units  Subcutaneous QHS  . insulin aspart  0-9 Units Subcutaneous TID WC  . insulin glargine  20 Units Subcutaneous BID  . ipratropium  0.5 mg Nebulization Q6H  . iron polysaccharides  150 mg Oral Daily  . levalbuterol  1.25 mg Nebulization Q6H  . metFORMIN  1,000 mg Oral Q breakfast  . methylPREDNISolone (SOLU-MEDROL) injection  60 mg Intravenous Q8H  .  mometasone-formoterol  2 puff Inhalation BID  . pantoprazole  40 mg Oral Daily  . Rivaroxaban  15 mg Oral Daily  . roflumilast  500 mcg Oral Daily  . simvastatin  40 mg Oral QPM  . sodium chloride  3 mL Intravenous Q12H  . Tamsulosin HCl  0.8 mg Oral QPC supper   Continuous Infusions:  PRN Meds:.bisacodyl, HYDROmorphone (DILAUDID) injection, magnesium hydroxide, ondansetron (ZOFRAN) IV, ondansetron, oxyCODONE, traZODone  Assessment/Plan:  Principal Problem:  *COPD with exacerbation Active Problems:  Iron deficiency anemia  DM type 2 (diabetes mellitus, type 2)  History of lung cancer  Atrial flutter  CKD (chronic kidney disease), stage III  Anemia due to chronic blood loss  Chronic respiratory failure with hypoxia  Pulmonary nodules  Pneumonia  .Acute on chronic dyspnea. Etiology likely multifactorial. See below. Improving  Acute on Chronic hypoxic respiratory failure secondary to COPD/COPD with exacerbation. The patient chronically uses 3 L of oxygen at home. We'll continue bronchodilator therapy with Dulera, Xopenex nebulizer, and Atrovent nebulizer. Continue oxygen at 2-3 L a minute. He is on antibiotics and IV steroids. Appreciate Dr. Juanetta Gosling assistance. He appears to have severe COPD and very poor functional capacity at baseline. Patient becomes exhausted when walking from one room to another in his house. It appears that he is back to his baseline functional status. Continue current treatments   Pulmonary nodularity. We'll treat this as community-acquired pneumonia. However, in the setting of history of lung cancer, the patient may need further imaging with a PET scan electively.   Atrial flutter with RVR. Heart rate remains uncontrolled, mostly in the 140s. Patient is on max dose of diltiazem. He was scheduled for cardioversion this week, but this has been postponed. He is anticoagulated with the Xarelto. Cardiology is following. He is on digoxin and diltiazem but heart rate  remains uncontrolled.  He was started on amiodarone load yesterday.  Will continue to monitor for improvement.  Microcytic, iron deficiency anemia, presumed to be secondary to chronic blood loss; in the setting of chronic anticoagulation. Status post 2 units of packed red blood cell transfusion. The patient has a history of diverticulosis per colonoscopy in October 2012. He is on Protonix empirically. GI his been consulted and was planning on EGD, but this procedure was canceled due to the patient's tachyarrhythmia. He will need EGD once his heart rate is under better control. His hemoglobin appears to be stable.  Chronic kidney disease. Currently stable.   Query diastolic dysfunction. Echocardiogram in June 2013 could not evaluate for diastolic dysfunction. The patient was given a total of 60 mg of Lasix following the blood transfusions.   Time spent coordinating care:   LOS: 4 days   [place] Cawood [facility] Pager: [phone] 11/12/2012, 10:45 AM

## 2012-11-12 NOTE — Progress Notes (Signed)
Inpatient Diabetes Program Recommendations  AACE/ADA: New Consensus Statement on Inpatient Glycemic Control (2013)  Target Ranges:  Prepandial:   less than 140 mg/dL      Peak postprandial:   less than 180 mg/dL (1-2 hours)      Critically ill patients:  140 - 180 mg/dL    Inpatient Diabetes Program Recommendations Correction (SSI): Please consider increasing correction scale to Novolog resistant scale since patient is on steroids.  Note: Blood glucose has been consistently elevated over the last 3 days.  Lantus was increased to 20 units BID this morning.  Recommend also increasing Novolog to resistant correction scale.  Will continue to follow.  Thanks, Orlando Penner, RN, BSN, CCRN Diabetes Coordinator Inpatient Diabetes Program [phone]

## 2012-11-12 NOTE — Progress Notes (Signed)
Primary cardiologist: Dr. Ferdinand Bing  Electrophysiologist: Dr. Lewayne Bunting  SUBJECTIVE: Breating a little better, but still easily short of breath with exertion.  LABS: Basic Metabolic Panel:  Basename 11/11/12 1851 11/11/12 0527 11/10/12 0536  NA -- 135 139  K -- 4.6 4.6  CL -- 100 103  CO2 -- 25 26  GLUCOSE 478* 292* --  BUN -- 36* 29*  CREATININE -- 1.48* 1.45*  CALCIUM -- 9.7 9.7  MG -- -- --  PHOS -- -- --   CBC:  Basename 11/12/12 0525 11/11/12 0527  WBC 7.1 7.7  NEUTROABS -- --  HGB 9.2* 9.8*  HCT 31.5* 32.9*  MCV 75.2* 74.8*  PLT 324 354    PHYSICAL EXAM BP 100/48  Pulse 114  Temp 97.6 F (36.4 C) (Oral)  Resp 20  Ht 5\' 8"  (1.727 m)  Wt 166 lb 0.1 oz (75.3 kg)  BMI 25.24 kg/m2  SpO2 95% General: No acute distress Head: Normal cephalic and atramatic  Lungs: Clear bilaterally to auscultation and percussion. Heart: Irregularly irregular, no gallop. Extremities: No clubbing, cyanosis or edema.  DP +1  TELEMETRY: Reviewed telemetry pt ZO:XWRUEA fibrillation with PVC's. Rates 114-134 bpm.  ASSESSMENT AND PLAN:  1. Atrial Flutter with RVR: Just placed on amiodarone 400 mg twice a day with first dose last evening. Heart rate is still not adequately controlled with heart rate between 100 and 134 beats per minute. The patient is also on digoxin 0.125 mg daily, diltiazem 120 mg 4 times a day, and also on rivaroxaban 15 mg daily. I discussed further management options and overall plan with [name] as his breathing status has improved and hemoglobin has stabilized. [name] recommended the patient continue amiodarone loading as he has recently been started on this within the last 24 hours. He is to continue on diltiazem and digoxin. [name] wishes to see him in the office next week should he he discharged home over the next 1-2 days depending on heart rate control. Will revisit need for ablation or DCCV at that time. I have explained this to the patient  who verbalizes understanding. I will repeat his EKG this morning for better evaluation of heart rate and rhythm, as telemetry he has shown to have a lot of artifact.    2. COPD: He is breathing better. He continues on oxygen via nasal cannula which she will need to wear 24 hours a day. In the past he would not wear it consistently. This should be reinforced prior to him going home.  3. Anemia: EGD has been postponed in the setting of elevated heart rate. After discussed with Dr. Kerry Hough, he states that the EGD would be elective procedure as his hemoglobin and hematocrit have remained stable. Patient did receive 2 units of packed red blood cells. No evidence of bleeding has been found, there no positive Hemoccult stools. We will defer to GI for need to reconsider EGD during hospitalization.  Bettey Mare. Lyman Bishop NP [facility] 11/12/2012, 8:28 AM   Attending note:  Patient seen and examined. Reviewed recent hospital course and modified above note per Ms. Lawrence NP. Goal is still to achieve better heart rate control of atrial flutter, amiodarone was added by Dr. Dietrich Pates yesterday. Patient's flutter ablation canceled for now, information communicated to [name] who recommends further medical therapy, more time on amiodarone, and ultimately a followup with him next week. Breathing seems to be improved, although he is still short of breath with activity. Plan  to continue amiodarone load for now, possibly be able to go home in the next one or 2 days pending a heart rate control.  Jonelle Sidle, M.D., F.A.C.C.

## 2012-11-13 ENCOUNTER — Inpatient Hospital Stay (HOSPITAL_COMMUNITY): Payer: Medicare Other

## 2012-11-13 LAB — CBC
Hemoglobin: 9.6 g/dL — ABNORMAL LOW (ref 13.0–17.0)
RBC: 4.47 MIL/uL (ref 4.22–5.81)
WBC: 9.2 10*3/uL (ref 4.0–10.5)

## 2012-11-13 LAB — BLOOD GAS, ARTERIAL
Acid-base deficit: 2.7 mmol/L — ABNORMAL HIGH (ref 0.0–2.0)
Bicarbonate: 21.4 mEq/L (ref 20.0–24.0)
O2 Saturation: 96.2 %
Patient temperature: 37
TCO2: 20 mmol/L (ref 0–100)

## 2012-11-13 LAB — GLUCOSE, CAPILLARY
Glucose-Capillary: 388 mg/dL — ABNORMAL HIGH (ref 70–99)
Glucose-Capillary: 431 mg/dL — ABNORMAL HIGH (ref 70–99)

## 2012-11-13 LAB — BASIC METABOLIC PANEL
CO2: 26 mEq/L (ref 19–32)
Chloride: 100 mEq/L (ref 96–112)
Glucose, Bld: 362 mg/dL — ABNORMAL HIGH (ref 70–99)
Potassium: 5.2 mEq/L — ABNORMAL HIGH (ref 3.5–5.1)
Sodium: 135 mEq/L (ref 135–145)

## 2012-11-13 MED ORDER — INSULIN ASPART 100 UNIT/ML ~~LOC~~ SOLN
5.0000 [IU] | Freq: Three times a day (TID) | SUBCUTANEOUS | Status: DC
  Administered 2012-11-13 – 2012-11-14 (×3): 5 [IU] via SUBCUTANEOUS

## 2012-11-13 MED ORDER — DILTIAZEM HCL ER COATED BEADS 180 MG PO CP24
360.0000 mg | ORAL_CAPSULE | Freq: Every day | ORAL | Status: DC
  Administered 2012-11-14: 360 mg via ORAL
  Filled 2012-11-13: qty 2

## 2012-11-13 MED ORDER — INSULIN GLARGINE 100 UNIT/ML ~~LOC~~ SOLN
30.0000 [IU] | Freq: Two times a day (BID) | SUBCUTANEOUS | Status: DC
  Administered 2012-11-13 – 2012-11-14 (×2): 30 [IU] via SUBCUTANEOUS

## 2012-11-13 MED ORDER — LEVALBUTEROL HCL 0.63 MG/3ML IN NEBU: 0.6300 mg | INHALATION_SOLUTION | RESPIRATORY_TRACT | Status: DC | PRN

## 2012-11-13 MED ORDER — AMIODARONE HCL 200 MG PO TABS
400.0000 mg | ORAL_TABLET | Freq: Every day | ORAL | Status: DC
  Administered 2012-11-14: 400 mg via ORAL
  Filled 2012-11-13: qty 2

## 2012-11-13 NOTE — Progress Notes (Signed)
Patient and states he has good appetite. He states he breathes better when he is sitting upright. He denies nausea vomiting abdominal pain or rectal bleeding. He remains with rapid heart rate oozing between 106 and 130s. H&H is 9.6 and 33.2. Assessment; Anemia secondary to occult GI bleed. Iron studies confirmed iron deficiency anemia. Status post transfusion with 2 units of PRBCs. EGD with was scheduled but postponed because of tachyarrhythmia. Patient had colonoscopy last year by me with removal of 2 small polyps which were non-adenomatous. He remains on on PPI as well as Riveroxaban. Other problems include COPD, history of lung CA and he is also being treated for pneumonia.  Recommendations; Will continue to monitor H&H while he is in the hospital. May consider EGD prior to discharge or on an outpatient basis .  Dr. Darrick Penna will be seeing patient over the weekend.

## 2012-11-13 NOTE — Progress Notes (Signed)
SUBJECTIVE:Continues short of breath, but improved since admission. He denies palpitations.   LABS: Basic Metabolic Panel:  Basename 11/13/12 0533 11/11/12 1851 11/11/12 0527  NA 135 -- 135  K 5.2* -- 4.6  CL 100 -- 100  CO2 26 -- 25  GLUCOSE 362* 478* --  BUN 40* -- 36*  CREATININE 1.68* -- 1.48*  CALCIUM 9.8 -- 9.7  MG -- -- --  PHOS -- -- --    Basename 11/13/12 0533 11/12/12 0525  WBC 9.2 7.1  NEUTROABS -- --  HGB 9.6* 9.2*  HCT 33.2* 31.5*  MCV 74.3* 75.2*  PLT 378 324   PHYSICAL EXAM BP 127/70  Pulse 106  Temp 97.1 F (36.2 C) (Oral)  Resp 22  Ht 5\' 8"  (1.727 m)  Wt 166 lb 0.1 oz (75.3 kg)  BMI 25.24 kg/m2  SpO2 93% General: Well developed, well nourished, in no acute distress Head: Eyes PERRLA, No xanthomas.   Normal cephalic and atramatic  Lungs: Clear bilaterally to auscultation and percussion; increased AP diameter; prolonged expiratory phase. Heart: HRRR S1 S2, No MRG; irregular rhythm .  Pulses are 2+ & equal.            No carotid bruit. No JVD.  No abdominal bruits. No femoral bruits. Abdomen: Bowel sounds are positive, abdomen soft and non-tender without masses or                  Hernia's noted. Msk:  Back normal, normal gait. Normal strength and tone for age. Extremities: No clubbing, cyanosis or edema.  DP +1 Neuro: Alert and oriented X 3. Psych:  Good affect, responds appropriately  TELEMETRY: Reviewed telemetry pt ZO:XWRUEA flutter, rate up around 8:30 am this morning to 130 bpm, transiently.   ASSESSMENT AND PLAN:  1. Atrial Flutter with RVR: Placed  on amiodarone 400 mg twice a day with total of 4 doses so far. Marland Kitchen Heart rate is still not adequately controlled with transient elevations in HR to 130's this am.. The patient is also on digoxin 0.125 mg daily, diltiazem 120 mg 4 times a day, and also on rivaroxaban 15 mg daily. I discussed further management options and overall plan with [name] yesterday by phone, as pt's breathing status has  improved and hemoglobin has stabilized. [name] recommended the patient continue amiodarone loading as he has recently been started on this within the last 24 hours. He is to continue on diltiazem and digoxin. [name] wishes to see him in the office next week in [place], Dec 31st 1:30 pm.  Ok to discharge home from cardiology standpoint.  2. COPD: Breathing better and back to baseline. He is to continue O2 via [place] for 24 a day. This should be reinforced at discharge.  3. Iron Deficiency Anemia: EGD has been postponed in the setting of elevated heart rate. After discussed with Dr. Kerry Hough, he states that the EGD would be elective procedure as his hemoglobin and hematocrit have remained stable. Patient did receive 2 units of packed red blood cells. No evidence of bleeding has been found, there no positive Hemoccult stools.   4. CKD Stage III 5. History of Lung Cancer 6. Hypercholesterolemia  Bettey Mare. Lyman Bishop NP [facility] 11/13/2012, 9:26 AM  Cardiology Attending Patient interviewed and examined. Discussed with Joni Reining, NP.  Above note annotated and modified based upon my findings.  Ventricular rate was elevated much of the morning, but now has slowed into the low 90s. No definite symptoms  referable to atrial arrhythmias. Change to diltiazem to a long-acting preparation.   Amiodarone dosage can be decreased and continued at discharge with an office visit within the next 2 weeks.  [place]ta Bing, MD 11/13/2012, 4:01 PM

## 2012-11-13 NOTE — Progress Notes (Signed)
[facility]             Progress Note   Subjective: Reports breathing is about the same. He is starting to cough more.  Objective: Vital signs in last 24 hours: Temp:  [97.1 F (36.2 C)-97.6 F (36.4 C)] 97.6 F (36.4 C) (12/20 1400) Pulse Rate:  [95-129] 105  (12/20 1400) Resp:  [20-22] 20  (12/20 1400) BP: (111-128)/(60-70) 128/60 mmHg (12/20 1400) SpO2:  [93 %-100 %] 100 % (12/20 1400) Weight change:  Last BM Date: 11/12/12  Intake/Output from previous day: 12/19 0701 - 12/20 0700 In: 600 [P.O.:600] Out: 600 [Urine:600] Total I/O In: 360 [P.O.:360] Out: -    Physical Exam: General: Alert, awake, oriented x3, appears to have increased work of breathing today. HEENT: No bruits, no goiter. Heart: Irregular, tachycardic Lungs: Diminished breath sounds bilaterally with occasional wheezes Abdomen: Soft, nontender, nondistended, positive bowel sounds. Extremities: 1+ pedal edema bilaterally Neuro: Grossly intact, nonfocal.    Lab Results: Basic Metabolic Panel:  Basename 11/13/12 0533 11/11/12 1851 11/11/12 0527  NA 135 -- 135  K 5.2* -- 4.6  CL 100 -- 100  CO2 26 -- 25  GLUCOSE 362* 478* --  BUN 40* -- 36*  CREATININE 1.68* -- 1.48*  CALCIUM 9.8 -- 9.7  MG -- -- --  PHOS -- -- --   Liver Function Tests: No results found for this basename: AST:2,ALT:2,ALKPHOS:2,BILITOT:2,PROT:2,ALBUMIN:2 in the last 72 hours No results found for this basename: LIPASE:2,AMYLASE:2 in the last 72 hours No results found for this basename: AMMONIA:2 in the last 72 hours CBC:  Basename 11/13/12 0533 11/12/12 0525  WBC 9.2 7.1  NEUTROABS -- --  HGB 9.6* 9.2*  HCT 33.2* 31.5*  MCV 74.3* 75.2*  PLT 378 324   Cardiac Enzymes: No results found for this basename: CKTOTAL:3,CKMB:3,CKMBINDEX:3,TROPONINI:3 in the last 72 hours BNP: No results found for this basename: PROBNP:3 in the last 72 hours D-Dimer: No results found for this basename: DDIMER:2 in the last 72  hours CBG:  Basename 11/13/12 1624 11/13/12 1307 11/13/12 1118 11/13/12 0730 11/12/12 2140 11/12/12 1622  GLUCAP 346* 431* 388* 379* 229* 263*   Hemoglobin A1C: No results found for this basename: HGBA1C in the last 72 hours Fasting Lipid Panel: No results found for this basename: CHOL,HDL,LDLCALC,TRIG,CHOLHDL,LDLDIRECT in the last 72 hours Thyroid Function Tests: No results found for this basename: TSH,T4TOTAL,FREET4,T3FREE,THYROIDAB in the last 72 hours Anemia Panel: No results found for this basename: VITAMINB12,FOLATE,FERRITIN,TIBC,IRON,RETICCTPCT in the last 72 hours Coagulation: No results found for this basename: LABPROT:2,INR:2 in the last 72 hours Urine Drug Screen: Drugs of Abuse  No results found for this basename: labopia,  cocainscrnur,  labbenz,  amphetmu,  thcu,  labbarb    Alcohol Level: No results found for this basename: ETH:2 in the last 72 hours Urinalysis: No results found for this basename: COLORURINE:2,APPERANCEUR:2,LABSPEC:2,PHURINE:2,GLUCOSEU:2,HGBUR:2,BILIRUBINUR:2,KETONESUR:2,PROTEINUR:2,UROBILINOGEN:2,NITRITE:2,LEUKOCYTESUR:2 in the last 72 hours No results found for this or any previous visit (from the past 240 hour(s)).  Studies/Results: Dg Chest Port 1 View  11/13/2012  *RADIOLOGY REPORT*  Clinical Data: Shortness of breath.  PORTABLE CHEST - 1 VIEW  Comparison: Chest x-ray c t chest 11/08/2012.  Findings: Postoperative changes of the right upper lobe are again noted.  Ill-defined airspace disease persists in the left lower lobe.  The heart is enlarged.  No other focal airspace disease is present.  IMPRESSION:  1.  Persistent left lower lobe airspace disease remains worrisome for infection. 2.  Postoperative changes of the right upper lobe.  Original Report Authenticated By: Marin Roberts, M.D.     Medications: Scheduled Meds:    . amiodarone  400 mg Oral Daily  . antiseptic oral rinse  15 mL Mouth Rinse BID  . azithromycin  500 mg Oral  q1800  . cefTRIAXone (ROCEPHIN)  IV  1 g Intravenous Q24H  . digoxin  0.125 mg Oral Daily  . diltiazem  360 mg Oral Daily  . fluticasone  1 spray Each Nare Daily  . furosemide  20 mg Oral Daily  . insulin aspart  0-20 Units Subcutaneous TID WC  . insulin aspart  0-5 Units Subcutaneous QHS  . insulin aspart  5 Units Subcutaneous TID WC  . insulin glargine  30 Units Subcutaneous BID  . ipratropium  0.5 mg Nebulization Q6H  . iron polysaccharides  150 mg Oral Daily  . levalbuterol  1.25 mg Nebulization Q6H  . methylPREDNISolone (SOLU-MEDROL) injection  60 mg Intravenous Q12H  . mometasone-formoterol  2 puff Inhalation BID  . pantoprazole  40 mg Oral Daily  . Rivaroxaban  15 mg Oral Daily  . roflumilast  500 mcg Oral Daily  . simvastatin  40 mg Oral QPM  . sodium chloride  3 mL Intravenous Q12H  . Tamsulosin HCl  0.8 mg Oral QPC supper   Continuous Infusions:  PRN Meds:.bisacodyl, HYDROmorphone (DILAUDID) injection, levalbuterol, magnesium hydroxide, ondansetron (ZOFRAN) IV, ondansetron, oxyCODONE, traZODone  Assessment/Plan:  Principal Problem:  *COPD with exacerbation Active Problems:  Iron deficiency anemia  DM type 2 (diabetes mellitus, type 2)  History of lung cancer  Atrial flutter  CKD (chronic kidney disease), stage III  Anemia due to chronic blood loss  Chronic respiratory failure with hypoxia  Pulmonary nodules  Pneumonia  .Acute on chronic dyspnea. Etiology likely multifactorial. See below. Improving  Acute on Chronic hypoxic respiratory failure secondary to COPD/COPD with exacerbation. The patient chronically uses 3 L of oxygen at home. We'll continue bronchodilator therapy with Dulera, Xopenex nebulizer, and Atrovent nebulizer. Continue oxygen at 2-3 L a minute. He is on antibiotics and IV steroids. Appreciate Dr. Juanetta Gosling assistance. He appears to have severe COPD and very poor functional capacity at baseline. Patient becomes exhausted when walking from one room to  another in his house. Patient appears to have increased work of breathing today. Will check chest x-ray, ABG, continue with nebulizer treatments.   Pulmonary nodularity. We'll treat this as community-acquired pneumonia. However, in the setting of history of lung cancer, the patient may need further imaging with a PET scan electively.   Atrial flutter with RVR. Patient is on max dose of diltiazem. He was scheduled for cardioversion this week, but this has been postponed. He is anticoagulated with the Xarelto. Cardiology is following. He is on digoxin and diltiazem but heart rate remains uncontrolled.  He was started on amiodarone.  His heart rate appears to be slowly improving today. Will continue to monitor for improvement.  Microcytic, iron deficiency anemia, presumed to be secondary to chronic blood loss; in the setting of chronic anticoagulation. Status post 2 units of packed red blood cell transfusion. The patient has a history of diverticulosis per colonoscopy in October 2012. He is on Protonix empirically. GI his been consulted and was planning on EGD, but this procedure was canceled due to the patient's tachyarrhythmia. He will need EGD once his heart rate is under better control. His hemoglobin appears to be stable.  Chronic kidney disease. Currently stable.   Query diastolic dysfunction. Echocardiogram in June 2013 could not evaluate  for diastolic dysfunction. The patient was given a total of 60 mg of Lasix following the blood transfusions.   Time spent coordinating care:   LOS: 5 days   MEMON,JEHANZEB [facility] Pager: [phone] 11/13/2012, 5:42 PM

## 2012-11-14 ENCOUNTER — Other Ambulatory Visit: Payer: Self-pay

## 2012-11-14 LAB — CBC
Hemoglobin: 9.3 g/dL — ABNORMAL LOW (ref 13.0–17.0)
MCV: 74.1 fL — ABNORMAL LOW (ref 78.0–100.0)
Platelets: 365 10*3/uL (ref 150–400)
RBC: 4.32 MIL/uL (ref 4.22–5.81)
WBC: 8 10*3/uL (ref 4.0–10.5)

## 2012-11-14 LAB — BASIC METABOLIC PANEL
CO2: 27 mEq/L (ref 19–32)
Calcium: 9.7 mg/dL (ref 8.4–10.5)
Chloride: 101 mEq/L (ref 96–112)
Potassium: 4.9 mEq/L (ref 3.5–5.1)
Sodium: 137 mEq/L (ref 135–145)

## 2012-11-14 LAB — GLUCOSE, CAPILLARY

## 2012-11-14 MED ORDER — INSULIN GLARGINE 100 UNIT/ML ~~LOC~~ SOLN: 30.0000 [IU] | Freq: Two times a day (BID) | SUBCUTANEOUS | Status: DC

## 2012-11-14 MED ORDER — GUAIFENESIN-DM 100-10 MG/5ML PO SYRP
5.0000 mL | ORAL_SOLUTION | ORAL | Status: DC | PRN
  Administered 2012-11-14: 5 mL via ORAL
  Filled 2012-11-14: qty 5

## 2012-11-14 MED ORDER — ATORVASTATIN CALCIUM 20 MG PO TABS: 20.0000 mg | ORAL_TABLET | Freq: Every day | ORAL | Status: DC

## 2012-11-14 MED ORDER — AMIODARONE HCL 400 MG PO TABS: 400.0000 mg | ORAL_TABLET | Freq: Every day | ORAL | Status: DC

## 2012-11-14 MED ORDER — DILTIAZEM HCL ER COATED BEADS 360 MG PO CP24: 360.0000 mg | ORAL_CAPSULE | Freq: Every day | ORAL | Status: DC

## 2012-11-14 MED ORDER — BD GETTING STARTED TAKE HOME KIT: 3/10ML X 30G SYRINGES
1.0000 | Freq: Once | Status: AC
  Administered 2012-11-14: 1
  Filled 2012-11-14: qty 1

## 2012-11-14 MED ORDER — BENZONATATE 100 MG PO CAPS: 100.0000 mg | ORAL_CAPSULE | Freq: Three times a day (TID) | ORAL | Status: DC | PRN

## 2012-11-14 MED ORDER — GUAIFENESIN-DM 100-10 MG/5ML PO SYRP: 5.0000 mL | ORAL_SOLUTION | ORAL | Status: DC | PRN

## 2012-11-14 MED ORDER — INSULIN ASPART 100 UNIT/ML ~~LOC~~ SOLN: 5.0000 [IU] | Freq: Three times a day (TID) | SUBCUTANEOUS | Status: DC

## 2012-11-14 MED ORDER — DIGOXIN 125 MCG PO TABS: 0.1250 mg | ORAL_TABLET | Freq: Every day | ORAL | Status: DC

## 2012-11-14 MED ORDER — PREDNISONE 10 MG PO TABS: ORAL_TABLET | ORAL | Status: DC

## 2012-11-14 MED ORDER — LEVOFLOXACIN 750 MG PO TABS: 750.0000 mg | ORAL_TABLET | Freq: Every day | ORAL | Status: DC

## 2012-11-14 NOTE — Discharge Summary (Signed)
Physician Discharge Summary  Brendan Hall RUE:[id number] DOB: 1930-08-22 DOA: 11/08/2012  PCP: Colon Branch, MD  Admit date: 11/08/2012 Discharge date: 11/14/2012  Time spent: 60 minutes  Recommendations for Outpatient Follow-up:  1. Patient has been set up with home health services. 2. He will followup with [name] on December 31 to further discuss treatment of his atrial flutter. 3. He should follow up with Dr. Karilyn Cota the next 2 weeks to discuss need for endoscopy. 4. Followup with primary care physician in 2 weeks. 5. He will need a repeat CT chest in 2 months to evaluate pulmonary nodules. These are still present, then evaluation with PET scan may be appropriate.  Discharge Diagnoses:  Principal Problem:  *COPD with exacerbation Active Problems:  Iron deficiency anemia  DM type 2 (diabetes mellitus, type 2)  History of lung cancer  Atrial flutter  CKD (chronic kidney disease), stage III  Anemia due to chronic blood loss  Chronic respiratory failure with hypoxia  Pulmonary nodules  Pneumonia   Discharge Condition: Improved  Diet recommendation: Low salt, low carb  Filed Weights   11/10/12 0450 11/12/12 0500 11/14/12 0703  Weight: 70.3 kg (154 lb 15.7 oz) 75.3 kg (166 lb 0.1 oz) 75 kg (165 lb 5.5 oz)    History of present illness:  Brendan Hall is an 78 y.o. male. Elderly African American gentleman with a COPD, and atrial flutter anticoagulated, prevents with the above. In fact says she's been progressively short of breath for months but much worse over the past few days.  In the emergency room patient was found to be short of breath and wheezing; he did respond to nebulizers but was noted to be anemic with hemoglobin of 7.9 and had brown Hemoccult positive stool.  Review of the records show that his hemoglobin has dropped 3 points over the past 5 months and he now has a microcytic anemia despite daily iron supplements.  Colonoscopy in October last year  revealed small polyps  He has no fever nor leukocytosis; he has not been taking antibiotics.  He says he now feels very comfortable and at his baseline while lying down; his family insists he will be very short of breath if he tries to walk.  Patient is scheduled for what sounds like DC cardioversion by Dr. Sharrell Ku on Thursday this week   Hospital Course:  Acute on Chronic hypoxic respiratory failure secondary to COPD/COPD with exacerbation. The patient chronically uses 3 L of oxygen at home. He was continued on bronchodilator therapy with Dulera, Xopenex nebulizer, and Atrovent nebulizer. He was on antibiotics and IV steroids. Appreciate Dr. Juanetta Gosling assistance. He appears to have severe COPD and very poor functional capacity at baseline. Patient becomes exhausted when walking from one room to another in his house. His respiratory status appears to return to his baseline. Will transition steroids to prednisone and antibiotics to oral Levaquin. He reports having a nebulizer machine at home as well as his oxygen.   Pulmonary nodularity. Patient was found to have pulmonary nodules on CT scan during this hospitalization. This was treated as an infectious etiology as community-acquired pneumonia. However, in the setting of history of lung cancer, close followup with repeat CT has been recommended in 2 months. If his nodules persist on repeat CT, PET and PET scan may be necessary for further evaluation. We will defer this workup to his primary care physician.  Atrial flutter with RVR. This proved challenging to control. Patient was placed on diltiazem, digoxin,  amiodarone. Beta blockers were avoided due to his severe COPD. Fortunately his heart rate has come under control. He is anticoagulated with Xarelto. He will followup with electrophysiology on December 31 to discuss possible ablation.  Microcytic, iron deficiency anemia, presumed to be secondary to chronic blood loss; in the setting of chronic  anticoagulation. Status post 2 units of packed red blood cell transfusion. The patient has a history of diverticulosis per colonoscopy in October 2012. He is on Protonix empirically. GI was consulted and was planning on EGD, but this procedure was canceled due to the patient's tachyarrhythmia and orthopnea. His hemoglobin remained stable during the remainder of his hospitalization. He'll be continued on Protonix. EGD may be pursued as an outpatient when he follows up with GI. He does not have any evidence of active bleeding at this time.  Chronic kidney disease. Currently stable.   Diabetes. Patient was on both metformin and glyburide as an outpatient. With his chronic kidney disease, these do not appear to be optimal agents. He has been maintained on insulin, and I think would benefit from outpatient insulin therapy. This can be further addressed by home health as well. We will continue him on Lantus and NovoLog.   Patient is returns to his baseline respiratory status and will be discharged home later today.   Procedures:  None  Consultations:  [facility]   Pulmonology, Dr. Juanetta Gosling  Gastroenterology, Dr. Karilyn Cota  Discharge Exam: Filed Vitals:   11/14/12 0157 11/14/12 1610 11/14/12 0703 11/14/12 0740  BP: 132/61 142/87    Pulse: 92 73    Temp: 98.1 F (36.7 C) 98.1 F (36.7 C)    TempSrc: Oral Oral    Resp: 22 20    Height:      Weight:   75 kg (165 lb 5.5 oz)   SpO2: 98% 93%  97%    General: No signs of distress Cardiovascular: S1, S2, irregular Respiratory: Diminished breath sounds bilaterally  Discharge Instructions  Discharge Orders    Future Appointments: Provider: Department: Dept Phone: Center:   11/20/2012 1:00 PM Kathlen Brunswick, MD [facility] [place] [phone] [facility]   11/24/2012 1:30 PM Marinus Maw, MD [facility] [place]) [phone] [facility]   11/30/2012 11:00 AM Ap-Ct 1 [place] [facility] [id number]  [facility]   12/02/2012 1:45 PM Kathlen Brunswick, MD [facility] [place] [phone] [facility]     Future Orders Please Complete By Expires   Diet - low sodium heart healthy      Home Health      Comments:   Copd program   Questions: Responses:   To provide the following care/treatments RN    Respiratory Care   Face-to-face encounter      Comments:   I MEMON,JEHANZEB certify that this patient is under my care and that I, or a nurse practitioner or physician's assistant working with me, had a face-to-face encounter that meets the physician face-to-face encounter requirements with this patient on 11/14/2012. The encounter with the patient was in whole, or in part for the following medical condition(s) which is the primary reason for home health care (List medical condition): COPD exacerbation, diabetes new to insulin, needs further education   Questions: Responses:   The encounter with the patient was in whole, or in part, for the following medical condition, which is the primary reason for home health care copd exacerbation, diabetes new to insulin   I certify that, based on my findings, the following  services are medically necessary home health services Nursing   My clinical findings support the need for the above services Shortness of breath with activity   Further, I certify that my clinical findings support that this patient is homebound due to: Shortness of Breath with activity   To provide the following care/treatments RN    Respiratory Care   Increase activity slowly          Medication List     As of 11/14/2012  2:12 PM    STOP taking these medications         glyBURIDE 5 MG tablet   Commonly known as: DIABETA      metFORMIN 1000 MG tablet   Commonly known as: GLUCOPHAGE      TAKE these medications         amiodarone 400 MG tablet   Commonly known as: PACERONE   Take 1 tablet (400 mg total) by mouth daily.      benzonatate 100 MG capsule   Commonly  known as: TESSALON   Take 1 capsule (100 mg total) by mouth 3 (three) times daily as needed for cough.      cyanocobalamin 1000 MCG tablet   Take 1,000 mcg by mouth daily.      DALIRESP 500 MCG Tabs tablet   Generic drug: roflumilast   Take 500 mcg by mouth daily.      digoxin 0.125 MG tablet   Commonly known as: LANOXIN   Take 1 tablet (0.125 mg total) by mouth daily.      diltiazem 360 MG 24 hr capsule   Commonly known as: CARDIZEM CD   Take 1 capsule (360 mg total) by mouth daily.      Fluticasone-Salmeterol 250-50 MCG/DOSE Aepb   Commonly known as: ADVAIR   Inhale 1 puff into the lungs every 12 (twelve) hours.      furosemide 40 MG tablet   Commonly known as: LASIX   Take 20 mg by mouth daily.      guaiFENesin-dextromethorphan 100-10 MG/5ML syrup   Commonly known as: ROBITUSSIN DM   Take 5 mLs by mouth every 4 (four) hours as needed for cough.      insulin aspart 100 UNIT/ML injection   Commonly known as: novoLOG   Inject 5 Units into the skin 3 (three) times daily with meals.      insulin glargine 100 UNIT/ML injection   Commonly known as: LANTUS   Inject 30 Units into the skin 2 (two) times daily.      ipratropium-albuterol 0.5-2.5 (3) MG/3ML Soln   Commonly known as: DUONEB   Take 3 mLs by nebulization 4 (four) times daily.      iron polysaccharides 150 MG capsule   Commonly known as: NIFEREX   Take 150 mg by mouth daily. Iron supplementation for your anemia.      levofloxacin 750 MG tablet   Commonly known as: LEVAQUIN   Take 1 tablet (750 mg total) by mouth daily.      mometasone 50 MCG/ACT nasal spray   Commonly known as: NASONEX   Place 2 sprays into the nose daily.      omeprazole 20 MG capsule   Commonly known as: PRILOSEC   Take 20 mg by mouth daily.      predniSONE 10 MG tablet   Commonly known as: DELTASONE   Take 40mg  po daily for 3 days, then 30mg  po daily for 3 days, then 20mg  po daily for 3 days, then  10mg  po daily for 3 days, then stop       Rivaroxaban 15 MG Tabs tablet   Commonly known as: XARELTO   Take 15 mg by mouth daily.      simvastatin 40 MG tablet   Commonly known as: ZOCOR   Take 40 mg by mouth daily.      Tamsulosin HCl 0.4 MG Caps   Commonly known as: FLOMAX   Take 0.8 mg by mouth daily after supper.      tiotropium 18 MCG inhalation capsule   Commonly known as: SPIRIVA   Place 18 mcg into inhaler and inhale daily.      VENTOLIN HFA 108 (90 BASE) MCG/ACT inhaler   Generic drug: albuterol   Inhale 2 puffs into the lungs every 6 (six) hours as needed. Shortness of breath           Follow-up Information    Follow up with [name]. On 11/24/2012. (F/U with MD on Dec 31 at 1: 30)    Contact information:   [place] [phone]      Follow up with REHMAN,NAJEEB U, MD. Schedule an appointment as soon as possible for a visit in 2 weeks.   Contact information:   [address], [address] [place] [address] [phone]       Follow up with Colon Branch, MD. Schedule an appointment as soon as possible for a visit in 2 weeks.   Contact information:   [address] [place] [address] [phone]           The results of significant diagnostics from this hospitalization (including imaging, microbiology, ancillary and laboratory) are listed below for reference.    Significant Diagnostic Studies: Ct Chest Wo Contrast  11/08/2012  *RADIOLOGY REPORT*  Clinical Data: Shortness of breath.  Atrial fibrillation, lung cancer.  CT CHEST WITHOUT CONTRAST  Technique:  Multidetector CT imaging of the chest was performed following the standard protocol without IV contrast.  Comparison: 11/08/2012 radiograph, 08/28/2012 CT  Findings: Post treatment changes involving the right chest. Emphysematous change.  Small right pleural effusion is similar to prior.  Aortic ascending ectasia, tapers to a normal caliber. Scattered atherosclerotic calcification.  Further vascular evaluation not possible without  intravenous contrast.  Heart size upper normal.  Coronary artery calcification.  No pericardial effusion.  Right hilar soft tissue attenuation/radiation changes are similar to prior.  No new hilar process or adenopathy.  Limited images through the upper abdomen are degraded by respiratory motion.  There is a probable cyst exophytic from the upper posterior right kidney.  Narrowed right upper lobe bronchi are similar to prior.  Otherwise, central airways are patent.  There is bilateral sub centimeter nodularity within the lower lobes that has developed in the interval. This is superimposed on left greater right posterior lung base opacities, similar to mildly more prominent in the interval.  Motion degrades osseous evaluation.  No definite acute osseous finding.  IMPRESSION: Post-treatment changes involving the right lung are similar to recent prior.  There is a new bilateral lower lobe nodularity which can be infectious, aspiration, or less likely (though not excluded) metastatic disease.  Recommend short-term chest CT follow-up.   Original Report Authenticated By: Jearld Lesch, M.D.    Dg Chest Port 1 View  11/13/2012  *RADIOLOGY REPORT*  Clinical Data: Shortness of breath.  PORTABLE CHEST - 1 VIEW  Comparison: Chest x-ray c t chest 11/08/2012.  Findings: Postoperative changes of the right upper lobe  are again noted.  Ill-defined airspace disease persists in the left lower lobe.  The heart is enlarged.  No other focal airspace disease is present.  IMPRESSION:  1.  Persistent left lower lobe airspace disease remains worrisome for infection. 2.  Postoperative changes of the right upper lobe.   Original Report Authenticated By: Marin Roberts, M.D.    Dg Chest Portable 1 View  11/08/2012  *RADIOLOGY REPORT*  Clinical Data: Shortness of breath.  History of lung cancer.  PORTABLE CHEST - 1 VIEW  Comparison: 08/28/2012 and CT scan of 08/28/2012  Findings: There is a new vague area of haziness at the left  base laterally which may represent early infiltrate.  Lungs are hyperinflated consistent with emphysema.  Scarring and retraction of the hilar structures on the right.  Scarring at the right lung base and in the right upper lung zone including vague nodularity. Chronic prominence of the pulmonary arteries.  IMPRESSION: New area of faint haziness at the left lung base which may represent an early infiltrate.   Original Report Authenticated By: Francene Boyers, M.D.     Microbiology: No results found for this or any previous visit (from the past 240 hour(s)).   Labs: Basic Metabolic Panel:  Lab 11/14/12 1610 11/13/12 0533 11/11/12 1851 11/11/12 0527 11/10/12 0536 11/09/12 1421 11/08/12 1732  NA 137 135 -- 135 139 142 --  K 4.9 5.2* -- 4.6 4.6 4.8 --  CL 101 100 -- 100 103 104 --  CO2 27 26 -- 25 26 26  --  GLUCOSE 232* 362* 478* 292* 249* -- --  BUN 41* 40* -- 36* 29* 26* --  CREATININE 1.55* 1.68* -- 1.48* 1.45* 1.58* --  CALCIUM 9.7 9.8 -- 9.7 9.7 10.1 --  MG -- -- -- -- -- -- 1.8  PHOS -- -- -- -- -- -- --   Liver Function Tests:  Lab 11/08/12 1732  AST 16  ALT 14  ALKPHOS 68  BILITOT 0.3  PROT 7.6  ALBUMIN 3.4*   No results found for this basename: LIPASE:5,AMYLASE:5 in the last 168 hours No results found for this basename: AMMONIA:5 in the last 168 hours CBC:  Lab 11/14/12 0504 11/13/12 0533 11/12/12 0525 11/11/12 0527 11/10/12 0536 11/08/12 1732  WBC 8.0 9.2 7.1 7.7 5.7 --  NEUTROABS -- -- -- -- -- 4.3  HGB 9.3* 9.6* 9.2* 9.8* 10.2* --  HCT 32.0* 33.2* 31.5* 32.9* 33.6* --  MCV 74.1* 74.3* 75.2* 74.8* 75.2* --  PLT 365 378 324 354 348 --   Cardiac Enzymes:  Lab 11/08/12 1732  CKTOTAL --  CKMB --  CKMBINDEX --  TROPONINI <0.30   BNP: BNP (last 3 results)  Basename 11/08/12 1732 08/28/12 1917 04/30/12 0907  PROBNP 993.1* 1091.0* 1787.0*   CBG:  Lab 11/14/12 1230 11/14/12 1113 11/14/12 0732 11/13/12 2048 11/13/12 1624  GLUCAP 207* 190* 222* 221* 346*        Signed:  MEMON,JEHANZEB  [facility] 11/14/2012, 2:12 PM

## 2012-11-16 ENCOUNTER — Encounter (HOSPITAL_COMMUNITY): Payer: Self-pay | Admitting: *Deleted

## 2012-11-16 ENCOUNTER — Emergency Department (HOSPITAL_COMMUNITY)
Admission: EM | Admit: 2012-11-16 | Discharge: 2012-11-16 | Disposition: A | Discharge status: Home / Self Care | Payer: Medicare Other | Attending: Emergency Medicine | Admitting: Emergency Medicine

## 2012-11-16 DIAGNOSIS — R300 Dysuria: Secondary | ICD-10-CM | POA: Insufficient documentation

## 2012-11-16 DIAGNOSIS — Z8709 Personal history of other diseases of the respiratory system: Secondary | ICD-10-CM | POA: Insufficient documentation

## 2012-11-16 DIAGNOSIS — R339 Retention of urine, unspecified: Secondary | ICD-10-CM | POA: Insufficient documentation

## 2012-11-16 DIAGNOSIS — Z8739 Personal history of other diseases of the musculoskeletal system and connective tissue: Secondary | ICD-10-CM | POA: Insufficient documentation

## 2012-11-16 DIAGNOSIS — D509 Iron deficiency anemia, unspecified: Secondary | ICD-10-CM | POA: Insufficient documentation

## 2012-11-16 DIAGNOSIS — Z87448 Personal history of other diseases of urinary system: Secondary | ICD-10-CM | POA: Insufficient documentation

## 2012-11-16 DIAGNOSIS — R3915 Urgency of urination: Secondary | ICD-10-CM | POA: Insufficient documentation

## 2012-11-16 DIAGNOSIS — J9610 Chronic respiratory failure, unspecified whether with hypoxia or hypercapnia: Secondary | ICD-10-CM | POA: Insufficient documentation

## 2012-11-16 DIAGNOSIS — Z85118 Personal history of other malignant neoplasm of bronchus and lung: Secondary | ICD-10-CM | POA: Insufficient documentation

## 2012-11-16 DIAGNOSIS — I10 Essential (primary) hypertension: Secondary | ICD-10-CM | POA: Insufficient documentation

## 2012-11-16 DIAGNOSIS — Z860100 Personal history of colon polyps, unspecified: Secondary | ICD-10-CM | POA: Insufficient documentation

## 2012-11-16 DIAGNOSIS — I252 Old myocardial infarction: Secondary | ICD-10-CM | POA: Insufficient documentation

## 2012-11-16 DIAGNOSIS — J449 Chronic obstructive pulmonary disease, unspecified: Secondary | ICD-10-CM | POA: Insufficient documentation

## 2012-11-16 DIAGNOSIS — Z7901 Long term (current) use of anticoagulants: Secondary | ICD-10-CM | POA: Insufficient documentation

## 2012-11-16 DIAGNOSIS — E119 Type 2 diabetes mellitus without complications: Secondary | ICD-10-CM | POA: Insufficient documentation

## 2012-11-16 DIAGNOSIS — I2510 Atherosclerotic heart disease of native coronary artery without angina pectoris: Secondary | ICD-10-CM | POA: Insufficient documentation

## 2012-11-16 DIAGNOSIS — Z794 Long term (current) use of insulin: Secondary | ICD-10-CM | POA: Insufficient documentation

## 2012-11-16 DIAGNOSIS — Z923 Personal history of irradiation: Secondary | ICD-10-CM | POA: Insufficient documentation

## 2012-11-16 DIAGNOSIS — E785 Hyperlipidemia, unspecified: Secondary | ICD-10-CM | POA: Insufficient documentation

## 2012-11-16 DIAGNOSIS — IMO0002 Reserved for concepts with insufficient information to code with codable children: Secondary | ICD-10-CM | POA: Insufficient documentation

## 2012-11-16 DIAGNOSIS — Z8679 Personal history of other diseases of the circulatory system: Secondary | ICD-10-CM | POA: Insufficient documentation

## 2012-11-16 DIAGNOSIS — J4489 Other specified chronic obstructive pulmonary disease: Secondary | ICD-10-CM | POA: Insufficient documentation

## 2012-11-16 LAB — URINALYSIS, MICROSCOPIC ONLY
Protein, ur: NEGATIVE mg/dL
Urobilinogen, UA: 0.2 mg/dL (ref 0.0–1.0)

## 2012-11-16 NOTE — ED Notes (Signed)
Family at bedside. 

## 2012-11-16 NOTE — ED Notes (Signed)
Patient states instant relief after insertion of foley. Right now the output is at 800cc RN made aware.

## 2012-11-16 NOTE — ED Notes (Signed)
Discharge instructions given and reviewed with patient and daughter.  Patient given leg bag per request to use for daytime.  Patient instructed to follow up with urologist; daughter verbalized understanding.  Patient discharged home in good condition via wheelchair.  Daughter to drive home.

## 2012-11-16 NOTE — ED Notes (Signed)
Pt d/c from [facility] on Saturday, admitted for "heart trouble",  Has 02 on.  Returned today for urinary retention.   Foley was removed on Saturday.

## 2012-11-16 NOTE — ED Provider Notes (Signed)
History    This chart was scribed for Ward Givens, MD, MD by Smitty Pluck, ED Scribe. The patient was seen in room [facility]11 and the patient's care was started at 6:10PM.   CSN: [id number]  Arrival date & time 11/16/12  1717      Chief Complaint  Patient presents with  . Urinary Retention    (Consider location/radiation/quality/duration/timing/severity/associated sxs/prior treatment) The history is provided by the patient. No language interpreter was used.   Brendan Hall is a 78 y.o. male with hx of DM, HTN, urinary retention and COPD who presents to the Emergency Department complaining of constant, urinary retention onset today this AM. Pt reports that he has urgency but has not been able to urinate since this morning. He reports that he was discharged from the hospital 2  days ago and he had had a foley catheter while in the hospital and it was removed when he was discharged. He states he has had to have a catheter before.  PT states he "feels like my abdomen is bursting".   Pt denies any other pain.   PCP DR Virgina Organ  Past Medical History  Diagnosis Date  . Arteriosclerotic cardiovascular disease (ASCVD)     Myocardial infarction in 1980; chronic dyspnea on exertion; 04/2012-normal EF by echo  . Hyperlipidemia   . Hypertension   . Diabetes mellitus, type II   . Arthritis   . COPD (chronic obstructive pulmonary disease)     Home oxygen  . Colon polyps 08/2011    Per colonoscopy, Dr. Karilyn Cota.  Marland Kitchen BPH (benign prostatic hyperplasia)   . Urinary retention 11/15/2011  . Renal mass, right 11/16/2011  . Iron deficiency anemia 11/14/2011  . Atrial flutter with rapid ventricular response 04/29/2012  . Chronic respiratory failure with hypoxia 11/09/2012  . Pulmonary nodules 11/09/2012  . Lung cancer     Past Surgical History  Procedure Date  . Soft tissue cyst excision     Face  . Colonoscopy 08/28/2011    Procedure: COLONOSCOPY;  Surgeon: Malissa Hippo, MD;  Location: [facility];  Service: Endoscopy;  Laterality: N/A;    Family History  Problem Relation Age of Onset  . Cancer Mother   . Cancer Sister   . Cancer Brother     History  Substance Use Topics  . Smoking status: Former Smoker -- 1.5 packs/day for 60 years    Types: Cigarettes    Quit date: 08/06/2008  . Smokeless tobacco: Never Used  . Alcohol Use: No     Comment: occasional    Lives at home Home oxygen  Review of Systems  Genitourinary: Positive for difficulty urinating.  All other systems reviewed and are negative.    Allergies  Review of patient's allergies indicates no known allergies.  Home Medications   Current Outpatient Rx  Name  Route  Sig  Dispense  Refill  . ALBUTEROL SULFATE HFA 108 (90 BASE) MCG/ACT IN AERS   Inhalation   Inhale 2 puffs into the lungs every 6 (six) hours as needed. Shortness of breath         . AMIODARONE HCL 400 MG PO TABS   Oral   Take 1 tablet (400 mg total) by mouth daily.   30 tablet   0   . BENZONATATE 100 MG PO CAPS   Oral   Take 1 capsule (100 mg total) by mouth 3 (three) times daily as needed for cough.   20 capsule   0   .  CYANOCOBALAMIN 1000 MCG PO TABS   Oral   Take 1,000 mcg by mouth daily.         Marland Kitchen DIGOXIN 0.125 MG PO TABS   Oral   Take 1 tablet (0.125 mg total) by mouth daily.   30 tablet   0   . DILTIAZEM HCL ER COATED BEADS 360 MG PO CP24   Oral   Take 1 capsule (360 mg total) by mouth daily.   30 capsule   1   . FLUTICASONE-SALMETEROL 250-50 MCG/DOSE IN AEPB   Inhalation   Inhale 1 puff into the lungs every 12 (twelve) hours.           . FUROSEMIDE 40 MG PO TABS   Oral   Take 20 mg by mouth daily.         . GUAIFENESIN-DM 100-10 MG/5ML PO SYRP   Oral   Take 5 mLs by mouth every 4 (four) hours as needed for cough.   240 mL   0   . INSULIN ASPART 100 UNIT/ML [place] SOLN   Subcutaneous   Inject 5 Units into the skin 3 (three) times daily with meals.   1 vial   1   . INSULIN GLARGINE 100  UNIT/ML  SOLN   Subcutaneous   Inject 30 Units into the skin 2 (two) times daily.   10 mL   1   . IPRATROPIUM-ALBUTEROL 0.5-2.5 (3) MG/3ML IN SOLN   Nebulization   Take 3 mLs by nebulization 4 (four) times daily.         Marland Kitchen POLYSACCHARIDE IRON COMPLEX 150 MG PO CAPS   Oral   Take 150 mg by mouth daily. Iron supplementation for your anemia.         Marland Kitchen LEVOFLOXACIN 750 MG PO TABS   Oral   Take 1 tablet (750 mg total) by mouth daily.   5 tablet   0   . METFORMIN HCL 1000 MG PO TABS               . MOMETASONE FUROATE 50 MCG/ACT NA SUSP   Nasal   Place 2 sprays into the nose daily.         Marland Kitchen OMEPRAZOLE 20 MG PO CPDR   Oral   Take 20 mg by mouth daily.          Marland Kitchen PREDNISONE 10 MG PO TABS      Take 40mg  po daily for 3 days, then 30mg  po daily for 3 days, then 20mg  po daily for 3 days, then 10mg  po daily for 3 days, then stop   30 tablet   0   . RIVAROXABAN 15 MG PO TABS   Oral   Take 15 mg by mouth daily.         . ROFLUMILAST 500 MCG PO TABS   Oral   Take 500 mcg by mouth daily.         Marland Kitchen SIMVASTATIN 40 MG PO TABS   Oral   Take 40 mg by mouth daily.          Marland Kitchen TAMSULOSIN HCL 0.4 MG PO CAPS   Oral   Take 0.8 mg by mouth daily after supper.         Marland Kitchen TIOTROPIUM BROMIDE MONOHYDRATE 18 MCG IN CAPS   Inhalation   Place 18 mcg into inhaler and inhale daily.           BP 129/71  Pulse 97  Temp 98.5 F (36.9 C) (  Oral)  Resp 22  Ht 5\' 8"  (1.727 m)  Wt 160 lb (72.576 kg)  BMI 24.33 kg/m2  SpO2 97%  Vital signs normal    Physical Exam  Nursing note and vitals reviewed. Constitutional: He is oriented to person, place, and time. He appears well-developed and well-nourished.  Non-toxic appearance. He does not appear ill. He appears distressed.  HENT:  Head: Normocephalic and atraumatic.  Right Ear: External ear normal.  Left Ear: External ear normal.  Nose: Nose normal. No mucosal edema or rhinorrhea.  Mouth/Throat: Mucous membranes are  normal. No dental abscesses or uvula swelling.  Eyes: Conjunctivae normal and EOM are normal. Pupils are equal, round, and reactive to light.  Neck: Normal range of motion and full passive range of motion without pain. Neck supple.  Cardiovascular: Normal rate.  Exam reveals no gallop and no friction rub.   No murmur heard. Pulmonary/Chest: He is in respiratory distress. He has no rhonchi. He exhibits no crepitus.  Abdominal: Soft. Normal appearance and bowel sounds are normal. He exhibits distension. There is tenderness. There is no rebound and no guarding.  Musculoskeletal: Normal range of motion. He exhibits no edema and no tenderness.       Moves all extremities well.   Neurological: He is alert and oriented to person, place, and time. He has normal strength. No cranial nerve deficit.  Skin: Skin is warm, dry and intact. No rash noted. No erythema. No pallor.  Psychiatric: He has a normal mood and affect. His speech is normal and behavior is normal. His mood appears not anxious.    ED Course  Procedures (including critical care time) DIAGNOSTIC STUDIES: Oxygen Saturation is 97% on room air, adequate by my interpretation.    COORDINATION OF CARE: 6:14 PM Discussed ED treatment with pt   Bladder scan shows over 200 cc of urine in bladder  Foley Catheter placed by nursing staff. Pt had 800 cc of urine output.  Pt discharge with the catheter.   Results for orders placed during the hospital encounter of 11/16/12  URINALYSIS, MICROSCOPIC ONLY      Component Value Range   Color, Urine YELLOW  YELLOW   APPearance CLEAR  CLEAR   Specific Gravity, Urine 1.015  1.005 - 1.030   pH 5.5  5.0 - 8.0   Glucose, UA NEGATIVE  NEGATIVE mg/dL   Hgb urine dipstick TRACE (*) NEGATIVE   Bilirubin Urine NEGATIVE  NEGATIVE   Ketones, ur NEGATIVE  NEGATIVE mg/dL   Protein, ur NEGATIVE  NEGATIVE mg/dL   Urobilinogen, UA 0.2  0.0 - 1.0 mg/dL   Nitrite NEGATIVE  NEGATIVE   Leukocytes, UA NEGATIVE   NEGATIVE   RBC / HPF 0-2  <3 RBC/hpf   Laboratory interpretation all normal    1. Urinary retention    Plan discharge  Devoria Albe, MD, FACEP    MDM    I personally performed the services described in this documentation, which was scribed in my presence. The recorded information has been reviewed and considered.  Devoria Albe, MD, Armando Gang       Ward Givens, MD 11/16/12 [phone]

## 2012-11-19 ENCOUNTER — Encounter: Payer: Self-pay | Admitting: Cardiology

## 2012-11-19 ENCOUNTER — Other Ambulatory Visit: Payer: Self-pay | Admitting: *Deleted

## 2012-11-19 MED ORDER — DILTIAZEM HCL ER COATED BEADS 180 MG PO CP24: 180.0000 mg | ORAL_CAPSULE | Freq: Two times a day (BID) | ORAL | Status: DC

## 2012-11-20 ENCOUNTER — Ambulatory Visit: Payer: Medicare Other | Admitting: Cardiology

## 2012-11-24 ENCOUNTER — Encounter: Payer: Self-pay | Admitting: *Deleted

## 2012-11-24 ENCOUNTER — Encounter: Payer: Self-pay | Admitting: Internal Medicine

## 2012-11-24 ENCOUNTER — Encounter (HOSPITAL_COMMUNITY): Payer: Self-pay | Admitting: *Deleted

## 2012-11-24 ENCOUNTER — Ambulatory Visit (INDEPENDENT_AMBULATORY_CARE_PROVIDER_SITE_OTHER): Payer: Medicare Other | Admitting: Internal Medicine

## 2012-11-24 VITALS — BP 139/49 | HR 62 | Ht 68.0 in | Wt 162.0 lb

## 2012-11-24 DIAGNOSIS — I4892 Unspecified atrial flutter: Secondary | ICD-10-CM

## 2012-11-24 LAB — CBC WITH DIFFERENTIAL/PLATELET
Basophils Absolute: 0 10*3/uL (ref 0.0–0.1)
Basophils Relative: 0 % (ref 0–1)
Eosinophils Absolute: 0 10*3/uL (ref 0.0–0.7)
Eosinophils Relative: 0 % (ref 0–5)
HCT: 34.4 % — ABNORMAL LOW (ref 39.0–52.0)
MCH: 22 pg — ABNORMAL LOW (ref 26.0–34.0)
MCHC: 29.7 g/dL — ABNORMAL LOW (ref 30.0–36.0)
MCV: 74.1 fL — ABNORMAL LOW (ref 78.0–100.0)
Monocytes Absolute: 0.4 10*3/uL (ref 0.1–1.0)
Neutro Abs: 8.9 10*3/uL — ABNORMAL HIGH (ref 1.7–7.7)
RDW: 20.7 % — ABNORMAL HIGH (ref 11.5–15.5)

## 2012-11-24 LAB — BASIC METABOLIC PANEL
BUN: 32 mg/dL — ABNORMAL HIGH (ref 6–23)
Calcium: 9.5 mg/dL (ref 8.4–10.5)
Creat: 1.62 mg/dL — ABNORMAL HIGH (ref 0.50–1.35)

## 2012-11-24 NOTE — Patient Instructions (Signed)

## 2012-11-24 NOTE — Progress Notes (Signed)
Pt verbalizes understanding of d/c instructions, follow up info and medications. All questions answered. IV d/c. Pt d/c via wheelchair by nursing staff. Sheryn Bison

## 2012-11-25 HISTORY — PX: RADIOFREQUENCY ABLATION: SHX2290

## 2012-11-26 ENCOUNTER — Encounter (HOSPITAL_COMMUNITY): Payer: Self-pay | Admitting: Respiratory Therapy

## 2012-11-26 ENCOUNTER — Encounter: Payer: Self-pay | Admitting: Internal Medicine

## 2012-11-26 NOTE — Progress Notes (Signed)
HPI Mr. Hafley returns today for followup. He was seen by me several weeks ago and scheduled for catheter ablation of atrial flutter. He was hospitalized a day before his procedure with worsening heart failure symptoms and possible pneumonia. His ECG was said to be atrial fibrillation, but I've reviewed all of his strips and all I can see his atrial flutter. He was placed on amiodarone. He remains in atrial flutter. He is free referred today for reconsideration of catheter ablation. No Known Allergies   Current Outpatient Prescriptions  Medication Sig Dispense Refill  . albuterol (VENTOLIN HFA) 108 (90 BASE) MCG/ACT inhaler Inhale 2 puffs into the lungs every 6 (six) hours as needed. Shortness of breath      . cyanocobalamin 1000 MCG tablet Take 1,000 mcg by mouth daily.      . digoxin (LANOXIN) 0.125 MG tablet Take 1 tablet (0.125 mg total) by mouth daily.  30 tablet  0  . Fluticasone-Salmeterol (ADVAIR) 250-50 MCG/DOSE AEPB Inhale 1 puff into the lungs every 12 (twelve) hours.        . furosemide (LASIX) 40 MG tablet Take 20 mg by mouth daily.      . insulin aspart (NOVOLOG) 100 UNIT/ML injection Inject 5 Units into the skin 3 (three) times daily with meals.  1 vial  1  . ipratropium-albuterol (DUONEB) 0.5-2.5 (3) MG/3ML SOLN Take 3 mLs by nebulization 4 (four) times daily.      . iron polysaccharides (NIFEREX) 150 MG capsule Take 150 mg by mouth daily. Iron supplementation for your anemia.      . mometasone (NASONEX) 50 MCG/ACT nasal spray Place 2 sprays into the nose daily.      . predniSONE (DELTASONE) 10 MG tablet Take 40mg  po daily for 3 days, then 30mg  po daily for 3 days, then 20mg  po daily for 3 days, then 10mg  po daily for 3 days, then stop  30 tablet  0  . Rivaroxaban (XARELTO) 15 MG TABS tablet Take 15 mg by mouth at bedtime.       . roflumilast (DALIRESP) 500 MCG TABS tablet Take 500 mcg by mouth daily.      . Tamsulosin HCl (FLOMAX) 0.4 MG CAPS Take 0.8 mg by mouth daily after  supper.      . tiotropium (SPIRIVA) 18 MCG inhalation capsule Place 18 mcg into inhaler and inhale daily.      Marland Kitchen amiodarone (PACERONE) 200 MG tablet Take 200 mg by mouth 2 (two) times daily.      . benzonatate (TESSALON) 100 MG capsule Take 1 capsule (100 mg total) by mouth 3 (three) times daily as needed for cough.  20 capsule  0  . diltiazem (CARDIZEM CD) 180 MG 24 hr capsule Take 180 mg by mouth daily.      . insulin glargine (LANTUS) 100 UNIT/ML injection Inject 20 Units into the skin 2 (two) times daily.       No current facility-administered medications for this visit.   Facility-Administered Medications Ordered in Other Visits  Medication Dose Route Frequency Provider Last Rate Last Dose  . diltiazem (CARDIZEM) injection 20 mg  20 mg Intravenous PRN Kathlen Brunswick, MD         Past Medical History  Diagnosis Date  . Arteriosclerotic cardiovascular disease (ASCVD)     Myocardial infarction in 1980; chronic dyspnea on exertion; 04/2012-normal EF by echo  . Hyperlipidemia   . Hypertension   . Diabetes mellitus, type II   . Arthritis   .  COPD (chronic obstructive pulmonary disease)     Home oxygen  . Colon polyps 08/2011    Colonoscopy, Dr. Karilyn Cota.  Marland Kitchen BPH (benign prostatic hyperplasia)   . Urinary retention 11/15/2011  . Renal mass, right 11/16/2011  . Iron deficiency anemia 11/14/2011  . Atrial flutter with rapid ventricular response 04/29/2012    RFA  . Chronic respiratory failure with hypoxia 11/09/2012  . Pulmonary nodules 11/09/2012  . Lung cancer     ROS:   All systems reviewed and negative except as noted in the HPI.   Past Surgical History  Procedure Date  . Soft tissue cyst excision     Face  . Colonoscopy 08/28/2011    Procedure: COLONOSCOPY;  Surgeon: Malissa Hippo, MD;  Location: [facility];  Service: Endoscopy;  Laterality: N/A;  . Radiofrequency ablation      Family History  Problem Relation Age of Onset  . Cancer Mother   . Cancer Sister     . Cancer Brother      History   Social History  . Marital Status: Married    Spouse Name: N/A    Number of Children: N/A  . Years of Education: N/A   Occupational History  . Not on file.   Social History Main Topics  . Smoking status: Former Smoker -- 1.5 packs/day for 60 years    Types: Cigarettes    Quit date: 08/06/2008  . Smokeless tobacco: Never Used  . Alcohol Use: No     Comment: occasional   . Drug Use: No  . Sexually Active: No   Other Topics Concern  . Not on file   Social History Narrative  . No narrative on file     BP 139/49  Pulse 62  Ht 5\' 8"  (1.727 m)  Wt 162 lb (73.483 kg)  BMI 24.63 kg/m2  SpO2 95%  Physical Exam:  Chronically ill appearing elderly man, NAD HEENT: Unremarkable Neck:  7 cm JVD, no thyromegally Lungs:  Clear with no wheezes, rales, or rhonchi. HEART:  Regular tachycardic rhythm, no murmurs, no rubs, no clicks Abd:  soft, positive bowel sounds, no organomegally, no rebound, no guarding Ext:  2 plus pulses, no edema, no cyanosis, no clubbing Skin:  No rashes no nodules Neuro:  CN II through XII intact, motor grossly intact  EKG Atrial flutter with a controlled ventricular rate  Assess/Plan:

## 2012-11-26 NOTE — Assessment & Plan Note (Signed)
The patient has persistent atrial flutter despite medical therapy. I've discussed the risk, benefits, goals, and expectations of catheter ablation and he wishes to proceed. He will continue his current anticoagulation regimen.

## 2012-11-27 ENCOUNTER — Other Ambulatory Visit: Payer: Self-pay | Admitting: *Deleted

## 2012-11-28 ENCOUNTER — Emergency Department (HOSPITAL_COMMUNITY)
Admission: EM | Admit: 2012-11-28 | Discharge: 2012-11-28 | Disposition: A | Discharge status: Home / Self Care | Payer: Medicare Other | Attending: Emergency Medicine | Admitting: Emergency Medicine

## 2012-11-28 ENCOUNTER — Encounter (HOSPITAL_COMMUNITY): Payer: Self-pay | Admitting: *Deleted

## 2012-11-28 DIAGNOSIS — Z794 Long term (current) use of insulin: Secondary | ICD-10-CM | POA: Insufficient documentation

## 2012-11-28 DIAGNOSIS — J449 Chronic obstructive pulmonary disease, unspecified: Secondary | ICD-10-CM | POA: Insufficient documentation

## 2012-11-28 DIAGNOSIS — I10 Essential (primary) hypertension: Secondary | ICD-10-CM | POA: Insufficient documentation

## 2012-11-28 DIAGNOSIS — Z87891 Personal history of nicotine dependence: Secondary | ICD-10-CM | POA: Insufficient documentation

## 2012-11-28 DIAGNOSIS — Z860100 Personal history of colon polyps, unspecified: Secondary | ICD-10-CM | POA: Insufficient documentation

## 2012-11-28 DIAGNOSIS — I252 Old myocardial infarction: Secondary | ICD-10-CM | POA: Insufficient documentation

## 2012-11-28 DIAGNOSIS — Z79899 Other long term (current) drug therapy: Secondary | ICD-10-CM | POA: Insufficient documentation

## 2012-11-28 DIAGNOSIS — J4489 Other specified chronic obstructive pulmonary disease: Secondary | ICD-10-CM | POA: Insufficient documentation

## 2012-11-28 DIAGNOSIS — I2510 Atherosclerotic heart disease of native coronary artery without angina pectoris: Secondary | ICD-10-CM | POA: Insufficient documentation

## 2012-11-28 DIAGNOSIS — Z87448 Personal history of other diseases of urinary system: Secondary | ICD-10-CM | POA: Insufficient documentation

## 2012-11-28 DIAGNOSIS — E785 Hyperlipidemia, unspecified: Secondary | ICD-10-CM | POA: Insufficient documentation

## 2012-11-28 DIAGNOSIS — E119 Type 2 diabetes mellitus without complications: Secondary | ICD-10-CM | POA: Insufficient documentation

## 2012-11-28 DIAGNOSIS — Z85118 Personal history of other malignant neoplasm of bronchus and lung: Secondary | ICD-10-CM | POA: Insufficient documentation

## 2012-11-28 DIAGNOSIS — J9610 Chronic respiratory failure, unspecified whether with hypoxia or hypercapnia: Secondary | ICD-10-CM | POA: Insufficient documentation

## 2012-11-28 DIAGNOSIS — N138 Other obstructive and reflux uropathy: Secondary | ICD-10-CM | POA: Insufficient documentation

## 2012-11-28 DIAGNOSIS — T83091A Other mechanical complication of indwelling urethral catheter, initial encounter: Secondary | ICD-10-CM | POA: Insufficient documentation

## 2012-11-28 DIAGNOSIS — Z8739 Personal history of other diseases of the musculoskeletal system and connective tissue: Secondary | ICD-10-CM | POA: Insufficient documentation

## 2012-11-28 DIAGNOSIS — T839XXA Unspecified complication of genitourinary prosthetic device, implant and graft, initial encounter: Secondary | ICD-10-CM

## 2012-11-28 DIAGNOSIS — Y846 Urinary catheterization as the cause of abnormal reaction of the patient, or of later complication, without mention of misadventure at the time of the procedure: Secondary | ICD-10-CM | POA: Insufficient documentation

## 2012-11-28 DIAGNOSIS — I4891 Unspecified atrial fibrillation: Secondary | ICD-10-CM | POA: Insufficient documentation

## 2012-11-28 DIAGNOSIS — Z862 Personal history of diseases of the blood and blood-forming organs and certain disorders involving the immune mechanism: Secondary | ICD-10-CM | POA: Insufficient documentation

## 2012-11-28 DIAGNOSIS — N401 Enlarged prostate with lower urinary tract symptoms: Secondary | ICD-10-CM | POA: Insufficient documentation

## 2012-11-28 LAB — URINALYSIS, MICROSCOPIC ONLY
Bilirubin Urine: NEGATIVE
Glucose, UA: NEGATIVE mg/dL
Nitrite: NEGATIVE
Protein, ur: NEGATIVE mg/dL
Urobilinogen, UA: 0.2 mg/dL (ref 0.0–1.0)
pH: 7.5 (ref 5.0–8.0)

## 2012-11-28 NOTE — ED Provider Notes (Signed)
History     CSN: [id number]  Arrival date & time 11/28/12  4098   First MD Initiated Contact with Patient 11/28/12 [phone]      Chief Complaint  Patient presents with  . Hematuria    patient has a indwelling foley and noticed some blood in it     (Consider location/radiation/quality/duration/timing/severity/associated sxs/prior treatment) Patient is a 79 y.o. male presenting with hematuria. The history is provided by the patient (the pt comlains of leaking from catheter). No language interpreter was used.  Hematuria This is a new problem. The current episode started today. The problem is unchanged. The hematuria occurs during the initial portion of his urinary stream. He reports no clotting in his urine stream. His pain is at a severity of 0/10. He is experiencing no pain. He describes his urine color as clear. Irritative symptoms do not include frequency. Pertinent negatives include no abdominal pain.    Past Medical History  Diagnosis Date  . Arteriosclerotic cardiovascular disease (ASCVD)     Myocardial infarction in 1980; chronic dyspnea on exertion; 04/2012-normal EF by echo  . Hyperlipidemia   . Hypertension   . Diabetes mellitus, type II   . Arthritis   . COPD (chronic obstructive pulmonary disease)     Home oxygen  . Colon polyps 08/2011    Colonoscopy, Dr. Karilyn Cota.  Marland Kitchen BPH (benign prostatic hyperplasia)   . Urinary retention 11/15/2011  . Renal mass, right 11/16/2011  . Iron deficiency anemia 11/14/2011  . Atrial flutter with rapid ventricular response 04/29/2012    RFA  . Chronic respiratory failure with hypoxia 11/09/2012  . Pulmonary nodules 11/09/2012  . Lung cancer     Past Surgical History  Procedure Date  . Soft tissue cyst excision     Face  . Colonoscopy 08/28/2011    Procedure: COLONOSCOPY;  Surgeon: Malissa Hippo, MD;  Location: [facility];  Service: Endoscopy;  Laterality: N/A;  . Radiofrequency ablation     Family History  Problem Relation Age of  Onset  . Cancer Mother   . Cancer Sister   . Cancer Brother     History  Substance Use Topics  . Smoking status: Former Smoker -- 1.5 packs/day for 60 years    Types: Cigarettes    Quit date: 08/06/2008  . Smokeless tobacco: Never Used  . Alcohol Use: No     Comment: occasional       Review of Systems  Constitutional: Negative for fatigue.  HENT: Negative for congestion, sinus pressure and ear discharge.   Eyes: Negative for discharge.  Respiratory: Negative for cough.   Cardiovascular: Negative for chest pain.  Gastrointestinal: Negative for abdominal pain and diarrhea.  Genitourinary: Positive for hematuria. Negative for frequency.  Musculoskeletal: Negative for back pain.  Skin: Negative for rash.  Neurological: Negative for seizures and headaches.  Hematological: Negative.   Psychiatric/Behavioral: Negative for hallucinations.    Allergies  Review of patient's allergies indicates no known allergies.  Home Medications   Current Outpatient Rx  Name  Route  Sig  Dispense  Refill  . ALBUTEROL SULFATE HFA 108 (90 BASE) MCG/ACT IN AERS   Inhalation   Inhale 2 puffs into the lungs every 6 (six) hours as needed. Shortness of breath         . AMIODARONE HCL 200 MG PO TABS   Oral   Take 200 mg by mouth 2 (two) times daily.         Marland Kitchen BENZONATATE 100 MG PO  CAPS   Oral   Take 1 capsule (100 mg total) by mouth 3 (three) times daily as needed for cough.   20 capsule   0   . CYANOCOBALAMIN 1000 MCG PO TABS   Oral   Take 1,000 mcg by mouth daily.         Marland Kitchen DIGOXIN 0.125 MG PO TABS   Oral   Take 1 tablet (0.125 mg total) by mouth daily.   30 tablet   0   . DILTIAZEM HCL ER COATED BEADS 180 MG PO CP24   Oral   Take 180 mg by mouth daily.         Marland Kitchen FLUTICASONE-SALMETEROL 250-50 MCG/DOSE IN AEPB   Inhalation   Inhale 1 puff into the lungs every 12 (twelve) hours.           . FUROSEMIDE 40 MG PO TABS   Oral   Take 20 mg by mouth daily.         .  INSULIN ASPART 100 UNIT/ML [place] SOLN   Subcutaneous   Inject 5 Units into the skin 3 (three) times daily with meals.   1 vial   1   . INSULIN GLARGINE 100 UNIT/ML [place] SOLN   Subcutaneous   Inject 20 Units into the skin 2 (two) times daily.         . IPRATROPIUM-ALBUTEROL 0.5-2.5 (3) MG/3ML IN SOLN   Nebulization   Take 3 mLs by nebulization 4 (four) times daily.         Marland Kitchen POLYSACCHARIDE IRON COMPLEX 150 MG PO CAPS   Oral   Take 150 mg by mouth daily. Iron supplementation for your anemia.         Marland Kitchen MOMETASONE FUROATE 50 MCG/ACT NA SUSP   Nasal   Place 2 sprays into the nose daily.         Marland Kitchen PREDNISONE 10 MG PO TABS      Take 40mg  po daily for 3 days, then 30mg  po daily for 3 days, then 20mg  po daily for 3 days, then 10mg  po daily for 3 days, then stop   30 tablet   0   . RIVAROXABAN 15 MG PO TABS   Oral   Take 15 mg by mouth at bedtime.          . ROFLUMILAST 500 MCG PO TABS   Oral   Take 500 mcg by mouth daily.         Marland Kitchen TAMSULOSIN HCL 0.4 MG PO CAPS   Oral   Take 0.8 mg by mouth daily after supper.         Marland Kitchen TIOTROPIUM BROMIDE MONOHYDRATE 18 MCG IN CAPS   Inhalation   Place 18 mcg into inhaler and inhale daily.           BP 122/66  Pulse 66  Temp 98.7 F (37.1 C) (Oral)  Resp 18  Ht 5\' 8"  (1.727 m)  Wt 153 lb (69.4 kg)  BMI 23.26 kg/m2  SpO2 98%  Physical Exam  Constitutional: He is oriented to person, place, and time. He appears well-developed.  HENT:  Head: Normocephalic and atraumatic.  Eyes: Conjunctivae normal and EOM are normal. No scleral icterus.  Neck: Neck supple. No thyromegaly present.  Cardiovascular: Normal rate and regular rhythm.  Exam reveals no gallop and no friction rub.   No murmur heard. Pulmonary/Chest: No stridor. He has no wheezes. He has no rales. He exhibits no tenderness.  Abdominal: He exhibits no distension.  There is no tenderness. There is no rebound.  Genitourinary:       Leaking around foley    Musculoskeletal: Normal range of motion. He exhibits no edema.  Lymphadenopathy:    He has no cervical adenopathy.  Neurological: He is oriented to person, place, and time. Coordination normal.  Skin: No rash noted. No erythema.  Psychiatric: He has a normal mood and affect. His behavior is normal.    ED Course  Procedures (including critical care time)  Labs Reviewed  URINALYSIS, MICROSCOPIC ONLY - Abnormal; Notable for the following:    Hgb urine dipstick LARGE (*)     Leukocytes, UA MODERATE (*)     All other components within normal limits  URINE CULTURE   No results found.   1. Foley catheter problem       MDM          [name], MD 11/28/12 [phone]

## 2012-11-28 NOTE — ED Notes (Signed)
Pt had foley present to arrival of ED> 71F catheter removed. NS removed from balloon- 10cc. No difficulties removing foley.

## 2012-11-29 LAB — URINE CULTURE

## 2012-11-30 ENCOUNTER — Ambulatory Visit (HOSPITAL_COMMUNITY)
Admission: RE | Admit: 2012-11-30 | Discharge: 2012-11-30 | Disposition: A | Discharge status: Home / Self Care | Payer: Medicare Other | Source: Ambulatory Visit | Attending: Nurse Practitioner | Admitting: Nurse Practitioner

## 2012-11-30 DIAGNOSIS — J449 Chronic obstructive pulmonary disease, unspecified: Secondary | ICD-10-CM

## 2012-12-02 ENCOUNTER — Ambulatory Visit: Payer: Medicare Other | Admitting: Cardiology

## 2012-12-04 ENCOUNTER — Encounter (HOSPITAL_COMMUNITY): Payer: Self-pay | Admitting: General Practice

## 2012-12-04 ENCOUNTER — Encounter (HOSPITAL_COMMUNITY)
Admission: RE | Disposition: A | Discharge status: Home / Self Care | Payer: Self-pay | Source: Ambulatory Visit | Attending: Internal Medicine

## 2012-12-04 ENCOUNTER — Ambulatory Visit (HOSPITAL_COMMUNITY)
Admission: RE | Admit: 2012-12-04 | Discharge: 2012-12-05 | Disposition: A | Discharge status: Home / Self Care | Payer: Medicare Other | Source: Ambulatory Visit | Attending: Internal Medicine | Admitting: Internal Medicine

## 2012-12-04 DIAGNOSIS — E1169 Type 2 diabetes mellitus with other specified complication: Secondary | ICD-10-CM | POA: Insufficient documentation

## 2012-12-04 DIAGNOSIS — J449 Chronic obstructive pulmonary disease, unspecified: Secondary | ICD-10-CM | POA: Insufficient documentation

## 2012-12-04 DIAGNOSIS — D500 Iron deficiency anemia secondary to blood loss (chronic): Secondary | ICD-10-CM

## 2012-12-04 DIAGNOSIS — I4892 Unspecified atrial flutter: Secondary | ICD-10-CM

## 2012-12-04 DIAGNOSIS — E119 Type 2 diabetes mellitus without complications: Secondary | ICD-10-CM

## 2012-12-04 DIAGNOSIS — J4489 Other specified chronic obstructive pulmonary disease: Secondary | ICD-10-CM | POA: Insufficient documentation

## 2012-12-04 DIAGNOSIS — I10 Essential (primary) hypertension: Secondary | ICD-10-CM | POA: Insufficient documentation

## 2012-12-04 DIAGNOSIS — N183 Chronic kidney disease, stage 3 unspecified: Secondary | ICD-10-CM

## 2012-12-04 DIAGNOSIS — Z7901 Long term (current) use of anticoagulants: Secondary | ICD-10-CM | POA: Insufficient documentation

## 2012-12-04 HISTORY — PX: ATRIAL FLUTTER ABLATION: SHX5733

## 2012-12-04 LAB — GLUCOSE, CAPILLARY
Glucose-Capillary: 141 mg/dL — ABNORMAL HIGH (ref 70–99)
Glucose-Capillary: 126 mg/dL — ABNORMAL HIGH (ref 70–99)
Glucose-Capillary: 264 mg/dL — ABNORMAL HIGH (ref 70–99)
Glucose-Capillary: 108 mg/dL — ABNORMAL HIGH (ref 70–99)

## 2012-12-04 LAB — CBC
MCHC: 28.9 g/dL — ABNORMAL LOW (ref 30.0–36.0)
RDW: 23.9 % — ABNORMAL HIGH (ref 11.5–15.5)
WBC: 5 10*3/uL (ref 4.0–10.5)

## 2012-12-04 LAB — BASIC METABOLIC PANEL
GFR calc Af Amer: 67 mL/min — ABNORMAL LOW (ref >90)
GFR calc non Af Amer: 58 mL/min — ABNORMAL LOW (ref >90)
Potassium: 5.3 mEq/L — ABNORMAL HIGH (ref 3.5–5.1)
Sodium: 136 mEq/L (ref 135–145)

## 2012-12-04 SURGERY — ATRIAL FLUTTER ABLATION
Anesthesia: LOCAL

## 2012-12-04 MED ORDER — ALBUTEROL SULFATE (5 MG/ML) 0.5% IN NEBU
2.5000 mg | INHALATION_SOLUTION | Freq: Four times a day (QID) | RESPIRATORY_TRACT | Status: DC
  Administered 2012-12-04 – 2012-12-05 (×4): 2.5 mg via RESPIRATORY_TRACT
  Filled 2012-12-04 (×4): qty 0.5

## 2012-12-04 MED ORDER — INSULIN GLARGINE 100 UNIT/ML ~~LOC~~ SOLN
20.0000 [IU] | Freq: Two times a day (BID) | SUBCUTANEOUS | Status: DC
  Administered 2012-12-04: 20 [IU] via SUBCUTANEOUS

## 2012-12-04 MED ORDER — SODIUM CHLORIDE 0.9 % IV SOLN: 250.0000 mL | INTRAVENOUS | Status: DC | PRN

## 2012-12-04 MED ORDER — ONDANSETRON HCL 4 MG/2ML IJ SOLN: 4.0000 mg | Freq: Four times a day (QID) | INTRAMUSCULAR | Status: DC | PRN

## 2012-12-04 MED ORDER — IPRATROPIUM-ALBUTEROL 0.5-2.5 (3) MG/3ML IN SOLN: 3.0000 mL | Freq: Four times a day (QID) | RESPIRATORY_TRACT | Status: DC

## 2012-12-04 MED ORDER — ROFLUMILAST 500 MCG PO TABS
500.0000 ug | ORAL_TABLET | Freq: Every day | ORAL | Status: DC
  Administered 2012-12-04 – 2012-12-05 (×2): 500 ug via ORAL
  Filled 2012-12-04 (×2): qty 1

## 2012-12-04 MED ORDER — TAMSULOSIN HCL 0.4 MG PO CAPS
0.8000 mg | ORAL_CAPSULE | Freq: Every day | ORAL | Status: DC
  Administered 2012-12-04: 0.8 mg via ORAL
  Filled 2012-12-04 (×2): qty 2

## 2012-12-04 MED ORDER — IPRATROPIUM BROMIDE 0.02 % IN SOLN
0.5000 mg | Freq: Four times a day (QID) | RESPIRATORY_TRACT | Status: DC
  Administered 2012-12-04 – 2012-12-05 (×4): 0.5 mg via RESPIRATORY_TRACT
  Filled 2012-12-04 (×4): qty 2.5

## 2012-12-04 MED ORDER — FENTANYL CITRATE 0.05 MG/ML IJ SOLN
INTRAMUSCULAR | Status: AC
  Filled 2012-12-04: qty 2

## 2012-12-04 MED ORDER — ACETAMINOPHEN 325 MG PO TABS: 650.0000 mg | ORAL_TABLET | ORAL | Status: DC | PRN

## 2012-12-04 MED ORDER — SODIUM CHLORIDE 0.9 % IJ SOLN: 3.0000 mL | INTRAMUSCULAR | Status: DC | PRN

## 2012-12-04 MED ORDER — RIVAROXABAN 15 MG PO TABS
15.0000 mg | ORAL_TABLET | Freq: Every day | ORAL | Status: DC
  Administered 2012-12-04: 21:00:00 15 mg via ORAL
  Filled 2012-12-04 (×2): qty 1

## 2012-12-04 MED ORDER — TIOTROPIUM BROMIDE MONOHYDRATE 18 MCG IN CAPS
18.0000 ug | ORAL_CAPSULE | Freq: Every day | RESPIRATORY_TRACT | Status: DC
  Filled 2012-12-04: qty 5

## 2012-12-04 MED ORDER — MIDAZOLAM HCL 5 MG/5ML IJ SOLN
INTRAMUSCULAR | Status: AC
  Filled 2012-12-04: qty 5

## 2012-12-04 MED ORDER — BENZONATATE 100 MG PO CAPS
100.0000 mg | ORAL_CAPSULE | Freq: Three times a day (TID) | ORAL | Status: DC | PRN
  Filled 2012-12-04: qty 1

## 2012-12-04 MED ORDER — SODIUM CHLORIDE 0.9 % IJ SOLN: 3.0000 mL | Freq: Two times a day (BID) | INTRAMUSCULAR | Status: DC

## 2012-12-04 MED ORDER — FUROSEMIDE 20 MG PO TABS
20.0000 mg | ORAL_TABLET | Freq: Every day | ORAL | Status: DC
  Administered 2012-12-04 – 2012-12-05 (×2): 20 mg via ORAL
  Filled 2012-12-04 (×2): qty 1

## 2012-12-04 MED ORDER — AMIODARONE HCL 200 MG PO TABS
200.0000 mg | ORAL_TABLET | Freq: Two times a day (BID) | ORAL | Status: DC
  Administered 2012-12-04: 21:00:00 200 mg via ORAL
  Filled 2012-12-04 (×4): qty 1

## 2012-12-04 MED ORDER — BUPIVACAINE HCL (PF) 0.25 % IJ SOLN
INTRAMUSCULAR | Status: AC
  Filled 2012-12-04: qty 60

## 2012-12-04 MED ORDER — INSULIN ASPART 100 UNIT/ML ~~LOC~~ SOLN
5.0000 [IU] | Freq: Three times a day (TID) | SUBCUTANEOUS | Status: DC
  Administered 2012-12-04: 18:00:00 5 [IU] via SUBCUTANEOUS

## 2012-12-04 MED ORDER — ALBUTEROL SULFATE HFA 108 (90 BASE) MCG/ACT IN AERS
2.0000 | INHALATION_SPRAY | Freq: Four times a day (QID) | RESPIRATORY_TRACT | Status: DC
  Filled 2012-12-04: qty 6.7

## 2012-12-04 MED ORDER — VITAMIN B-12 1000 MCG PO TABS
1000.0000 ug | ORAL_TABLET | Freq: Every day | ORAL | Status: DC
  Administered 2012-12-05: 1000 ug via ORAL
  Filled 2012-12-04 (×2): qty 1

## 2012-12-04 NOTE — H&P (View-Only) (Signed)
HPI Brendan Hall returns today for followup. He was seen by me several weeks ago and scheduled for catheter ablation of atrial flutter. He was hospitalized a day before his procedure with worsening heart failure symptoms and possible pneumonia. His ECG was said to be atrial fibrillation, but I've reviewed all of his strips and all I can see his atrial flutter. He was placed on amiodarone. He remains in atrial flutter. He is free referred today for reconsideration of catheter ablation. No Known Allergies   Current Outpatient Prescriptions  Medication Sig Dispense Refill  . albuterol (VENTOLIN HFA) 108 (90 BASE) MCG/ACT inhaler Inhale 2 puffs into the lungs every 6 (six) hours as needed. Shortness of breath      . cyanocobalamin 1000 MCG tablet Take 1,000 mcg by mouth daily.      . digoxin (LANOXIN) 0.125 MG tablet Take 1 tablet (0.125 mg total) by mouth daily.  30 tablet  0  . Fluticasone-Salmeterol (ADVAIR) 250-50 MCG/DOSE AEPB Inhale 1 puff into the lungs every 12 (twelve) hours.        . furosemide (LASIX) 40 MG tablet Take 20 mg by mouth daily.      . insulin aspart (NOVOLOG) 100 UNIT/ML injection Inject 5 Units into the skin 3 (three) times daily with meals.  1 vial  1  . ipratropium-albuterol (DUONEB) 0.5-2.5 (3) MG/3ML SOLN Take 3 mLs by nebulization 4 (four) times daily.      . iron polysaccharides (NIFEREX) 150 MG capsule Take 150 mg by mouth daily. Iron supplementation for your anemia.      . mometasone (NASONEX) 50 MCG/ACT nasal spray Place 2 sprays into the nose daily.      . predniSONE (DELTASONE) 10 MG tablet Take 40mg po daily for 3 days, then 30mg po daily for 3 days, then 20mg po daily for 3 days, then 10mg po daily for 3 days, then stop  30 tablet  0  . Rivaroxaban (XARELTO) 15 MG TABS tablet Take 15 mg by mouth at bedtime.       . roflumilast (DALIRESP) 500 MCG TABS tablet Take 500 mcg by mouth daily.      . Tamsulosin HCl (FLOMAX) 0.4 MG CAPS Take 0.8 mg by mouth daily after  supper.      . tiotropium (SPIRIVA) 18 MCG inhalation capsule Place 18 mcg into inhaler and inhale daily.      . amiodarone (PACERONE) 200 MG tablet Take 200 mg by mouth 2 (two) times daily.      . benzonatate (TESSALON) 100 MG capsule Take 1 capsule (100 mg total) by mouth 3 (three) times daily as needed for cough.  20 capsule  0  . diltiazem (CARDIZEM CD) 180 MG 24 hr capsule Take 180 mg by mouth daily.      . insulin glargine (LANTUS) 100 UNIT/ML injection Inject 20 Units into the skin 2 (two) times daily.       No current facility-administered medications for this visit.   Facility-Administered Medications Ordered in Other Visits  Medication Dose Route Frequency Provider Last Rate Last Dose  . diltiazem (CARDIZEM) injection 20 mg  20 mg Intravenous PRN Robert M Rothbart, MD         Past Medical History  Diagnosis Date  . Arteriosclerotic cardiovascular disease (ASCVD)     Myocardial infarction in 1980; chronic dyspnea on exertion; 04/2012-normal EF by echo  . Hyperlipidemia   . Hypertension   . Diabetes mellitus, type II   . Arthritis   .   COPD (chronic obstructive pulmonary disease)     Home oxygen  . Colon polyps 08/2011    Colonoscopy, Dr. Rehman.  . BPH (benign prostatic hyperplasia)   . Urinary retention 11/15/2011  . Renal mass, right 11/16/2011  . Iron deficiency anemia 11/14/2011  . Atrial flutter with rapid ventricular response 04/29/2012    RFA  . Chronic respiratory failure with hypoxia 11/09/2012  . Pulmonary nodules 11/09/2012  . Lung cancer     ROS:   All systems reviewed and negative except as noted in the HPI.   Past Surgical History  Procedure Date  . Soft tissue cyst excision     Face  . Colonoscopy 08/28/2011    Procedure: COLONOSCOPY;  Surgeon: Najeeb U Rehman, MD;  Location: [facility];  Service: Endoscopy;  Laterality: N/A;  . Radiofrequency ablation      Family History  Problem Relation Age of Onset  . Cancer Mother   . Cancer Sister     . Cancer Brother      History   Social History  . Marital Status: Married    Spouse Name: N/A    Number of Children: N/A  . Years of Education: N/A   Occupational History  . Not on file.   Social History Main Topics  . Smoking status: Former Smoker -- 1.5 packs/day for 60 years    Types: Cigarettes    Quit date: 08/06/2008  . Smokeless tobacco: Never Used  . Alcohol Use: No     Comment: occasional   . Drug Use: No  . Sexually Active: No   Other Topics Concern  . Not on file   Social History Narrative  . No narrative on file     BP 139/49  Pulse 62  Ht 5' 8" (1.727 m)  Wt 162 lb (73.483 kg)  BMI 24.63 kg/m2  SpO2 95%  Physical Exam:  Chronically ill appearing elderly man, NAD HEENT: Unremarkable Neck:  7 cm JVD, no thyromegally Lungs:  Clear with no wheezes, rales, or rhonchi. HEART:  Regular tachycardic rhythm, no murmurs, no rubs, no clicks Abd:  soft, positive bowel sounds, no organomegally, no rebound, no guarding Ext:  2 plus pulses, no edema, no cyanosis, no clubbing Skin:  No rashes no nodules Neuro:  CN II through XII intact, motor grossly intact  EKG Atrial flutter with a controlled ventricular rate  Assess/Plan:  

## 2012-12-04 NOTE — Op Note (Signed)
EPS/RFA of atrial flutter without immediate complication. [id number].

## 2012-12-04 NOTE — Interval H&P Note (Signed)
History and Physical Interval Note:  12/04/2012 10:29 AM  Brendan Hall  has presented today for surgery, with the diagnosis of Aflutter  The various methods of treatment have been discussed with the patient and family. After consideration of risks, benefits and other options for treatment, the patient has consented to  Procedure(s) (LRB) with comments: ATRIAL FLUTTER ABLATION (N/A) as a surgical intervention .  The patient's history has been reviewed, patient examined, no change in status, stable for surgery.  I have reviewed the patient's chart and labs.  Questions were answered to the patient's satisfaction.     Lewayne Bunting

## 2012-12-04 NOTE — Progress Notes (Signed)
Post ablation abnormal EKG done on the unit Brendan Demark PA reviewed and discussed with [name]. No orders received will continue to monitor. Patient stable.

## 2012-12-05 ENCOUNTER — Encounter (HOSPITAL_COMMUNITY): Payer: Self-pay | Admitting: Cardiology

## 2012-12-05 DIAGNOSIS — D500 Iron deficiency anemia secondary to blood loss (chronic): Secondary | ICD-10-CM

## 2012-12-05 DIAGNOSIS — I4892 Unspecified atrial flutter: Secondary | ICD-10-CM

## 2012-12-05 DIAGNOSIS — E119 Type 2 diabetes mellitus without complications: Secondary | ICD-10-CM

## 2012-12-05 LAB — GLUCOSE, CAPILLARY
Glucose-Capillary: 104 mg/dL — ABNORMAL HIGH (ref 70–99)
Glucose-Capillary: 87 mg/dL (ref 70–99)

## 2012-12-05 MED ORDER — INSULIN GLARGINE 100 UNIT/ML ~~LOC~~ SOLN: 10.0000 [IU] | Freq: Two times a day (BID) | SUBCUTANEOUS | Status: DC

## 2012-12-05 MED ORDER — DILTIAZEM HCL ER COATED BEADS 120 MG PO CP24: 120.0000 mg | ORAL_CAPSULE | Freq: Every day | ORAL | Status: DC

## 2012-12-05 MED ORDER — GLUCOSE 40 % PO GEL
ORAL | Status: AC
  Administered 2012-12-05: 1 via ORAL
  Filled 2012-12-05: qty 1

## 2012-12-05 MED ORDER — SALINE SPRAY 0.65 % NA SOLN
1.0000 | NASAL | Status: DC | PRN
  Filled 2012-12-05: qty 44

## 2012-12-05 NOTE — Op Note (Signed)
NAMEMarland Hall  JLON, BETKER NO.:  [id number]  MEDICAL RECORD NO.:  [id number]  LOCATION:  6533                         FACILITY:  [facility]  PHYSICIAN:  Doylene Canning. Ladona Ridgel, MD    DATE OF BIRTH:  March 22, 1930  DATE OF PROCEDURE:  12/04/2012 DATE OF DISCHARGE:                              OPERATIVE REPORT   PROCEDURE PERFORMED:  Electrophysiologic study and radiofrequency catheter ablation of atrial flutter.  INTRODUCTION:  The patient is an 79 year old male with symptomatic atrial flutter, who has had persistence of his flutter despite amiodarone therapy.  He is now referred for catheter ablation.  PROCEDURE:  After informed consent was obtained, the patient was taken to the Diagnostic EP Lab in the fasting state.  After usual preparation and draping, intravenous fentanyl and midazolam was given for sedation. A 6-French octapolar catheter was inserted percutaneously in the right femoral vein and advanced to the coronary sinus.  A 6-French quadripolar catheter was inserted percutaneously in the right femoral vein and advanced to the His bundle region.  A 7-French quadripolar ablation catheter was inserted percutaneously in the right femoral vein and advanced to the right atrium.  Mapping was first carried out demonstrating a normal-sized atrial flutter isthmus.  Mapping demonstrated typical counterclockwise tricuspid annular reentrant atrial flutter.  The ablation catheter was then maneuvered into the right ventricle and along the tricuspid valve annulus.  On the septal leaflet of the tricuspid valve annulus, RF energy was applied as the catheter was withdrawn towards the inferior vena cava.  Atrial flutters spontaneously slowed with RF energy application and was terminated with a single RF energy application.  Pacing was then carried out from the coronary sinus demonstrating that there was residual isthmus conduction. A total of 5 additional RF energy applications were  delivered to the atrial flutter isthmus resulting in the creation of atrial flutter isthmus block.  At this point, the patient was observed for approximately 20 minutes.  During this time, rapid ventricular pacing was carried out from the right ventricle demonstrating VA dissociation at greater than 600 milliseconds.  Programmed ventricular stimulation was carried out from the right ventricle also demonstrated VA dissociation at 600 milliseconds.  Programmed atrial stimulation was carried out from the atrium at base drive cycle length of 161 milliseconds demonstrating AV Wenckebach, and rapid atrial pacing was carried out from the atrium at a pacing cycle length of 690 milliseconds and stepwise decreased to 680 milliseconds where AV Wenckebach was observed.  During rapid atrial pacing following ablation, there was no inducible SVT or other arrhythmias noted.  At this point, the catheters were removed, hemostasis was assured, and the patient was returned to his room in satisfactory condition.  COMPLICATIONS:  There were no immediate procedure complications.  RESULTS:  A.  Baseline ECG:  Baseline ECG demonstrates atrial flutter with variable AV conduction. B.  Baseline intervals:  The atrial flutter cycle length was 280 milliseconds.  The QRS duration was 80 milliseconds.  The HV interval was 55 milliseconds. C.  Rapid ventricular pacing:  Following ablation, rapid ventricular pacing demonstrated VA dissociation at 600 milliseconds. D.  Programmed ventricular stimulation:  Programmed ventricular stimulation was carried out from the right ventricle demonstrating VA  dissociation at 600 milliseconds. E.  Rapid atrial pacing:  Rapid atrial pacing was carried out from the atrium at a base drive cycle length of 454 milliseconds and stepwise decreased down to 680 milliseconds where AV Wenckebach was observed. During rapid atrial pacing, the PR interval was less than the RR interval and there  was no inducible SVT. F.  Programmed atrial stimulation:  Programmed atrial stimulation was carried out from the atrium at base drive cycle length of 098 milliseconds demonstrating AV Wenckebach. G.  Arrhythmias observed. 1. Atrial flutter initiation was present at the time of EP study, the     duration was sustained, cycle length was 280 milliseconds, and     method of termination was with catheter ablation in the atrial     flutter isthmus.     a.     Mapping:  Mapping of atrial flutter isthmus was demonstrated      the usual size and orientation.     b.     RF energy application:  A total of 6 RF energy applications      were delivered.  During the first RF energy application, atrial      flutter was terminated and sinus rhythm was restored.  Following      the 6 RF energy applications, there was bidirectional block in the      atrial flutter isthmus.  CONCLUSION:  This study demonstrates successful electrophysiologic study and RF catheter ablation of typical atrial flutter with a total of 6 RF energy applications delivered to the usual atrial flutter isthmus resulting in termination of flutter, restoration of sinus rhythm, and creation of bidirectional block in the atrial flutter isthmus.     Doylene Canning. Ladona Ridgel, MD     GWT/MEDQ  D:  12/04/2012  T:  12/05/2012  Job:  [id number]

## 2012-12-05 NOTE — Discharge Summary (Signed)
Please see also my rounding note from today.

## 2012-12-05 NOTE — Discharge Summary (Signed)
Discharge Summary   Patient ID: Brendan Hall MRN: [id number], DOB/AGE: 07/17/1930 79 y.o.  Primary MD: Brendan Branch, MD Primary Cardiologist: Dr. Dietrich Hall Primary Electrophysiologist: Dr. Ladona Hall Admit date: 12/04/2012 D/C date:     12/05/2012      Primary Discharge Diagnoses:  1. Atrial Flutter  - s/p EPS and successful RFCA 1/10  - Amiodarone stopped, Xarelto & Cardizem continued  2. Hypoglycemia   Secondary Discharge Diagnoses:  . Arteriosclerotic cardiovascular disease (ASCVD)     Myocardial infarction in 1980; chronic dyspnea on exertion; 04/2012-normal EF by echo  . Hyperlipidemia   . Hypertension   . Diabetes mellitus, type II   . Arthritis   . COPD (chronic obstructive pulmonary disease)     Home oxygen  . Brendan polyps 08/2011    Colonoscopy, Dr. Karilyn Hall.  Marland Kitchen BPH (benign prostatic hyperplasia)   . Urinary retention 11/15/2011  . Renal mass, right 11/16/2011  . Iron deficiency anemia 11/14/2011  . Chronic respiratory failure with hypoxia 11/09/2012  . Pulmonary nodules 11/09/2012  . Lung cancer   . CHF (congestive heart failure)      Allergies No Known Allergies  Diagnostic Studies/Procedures:  12/04/12 - EPS and RFCA of A.Flutter See op note   History of Present Illness: 79 y.o. male w/ the above medical problems who presented to [facility] on 12/04/12 for planned EP study and radiofrequency catheter ablation of atrial flutter.  Hospital Course: [name] presented to [facility] in stable condition. He underwent EP study and successful radiofrequency catheter ablation of atrial flutter. He tolerated the procedure well without complications. He remained in NSR. Amiodarone was stopped and cardizem and xarelto continued. He was noted to have an episode of low blood sugar in the 60s without hypoglycemic symptoms after receiving more than normal Lantus dose. He was resumed on previous Lantus dosing. Cath site remained stable. He was  seen and evaluated by Dr. Diona Hall who felt he was stable for discharge home with plans for follow up as scheduled below.  Discharge Vitals: Blood pressure 117/56, pulse 89, temperature 97.8 F (36.6 C), temperature source Oral, resp. rate 26, height 5\' 8"  (1.727 m), weight 146 lb 9.7 oz (66.5 kg), SpO2 95.00%.  Labs: Component Value Date   WBC 5.0 12/04/2012   HGB 10.2* 12/04/2012   HCT 35.3* 12/04/2012   MCV 77.4* 12/04/2012   PLT 221 12/04/2012    Lab 12/04/12 0921  NA 136  K 5.3*  CL 98  CO2 26  BUN 20  CREATININE 1.15  CALCIUM 9.7  GLUCOSE 141*   Discharge Medications     Medication List     As of 12/05/2012 10:24 AM    STOP taking these medications         amiodarone 200 MG tablet   Commonly known as: PACERONE      TAKE these medications         benzonatate 100 MG capsule   Commonly known as: TESSALON   Take 1 capsule (100 mg total) by mouth 3 (three) times daily as needed for cough.      cyanocobalamin 1000 MCG tablet   Take 1,000 mcg by mouth daily.      DALIRESP 500 MCG Tabs tablet   Generic drug: roflumilast   Take 500 mcg by mouth daily.      digoxin 0.125 MG tablet   Commonly known as: LANOXIN   Take 1 tablet (0.125 mg total) by mouth daily.  diltiazem 120 MG 24 hr capsule   Commonly known as: CARDIZEM CD   Take 1 capsule (120 mg total) by mouth daily.      Fluticasone-Salmeterol 250-50 MCG/DOSE Aepb   Commonly known as: ADVAIR   Inhale 1 puff into the lungs every 12 (twelve) hours.      furosemide 40 MG tablet   Commonly known as: LASIX   Take 20 mg by mouth daily.      insulin aspart 100 UNIT/ML injection   Commonly known as: novoLOG   Inject 5 Units into the skin 3 (three) times daily with meals.      insulin glargine 100 UNIT/ML injection   Commonly known as: LANTUS   Inject 10 Units into the skin 2 (two) times daily.      ipratropium-albuterol 0.5-2.5 (3) MG/3ML Soln   Commonly known as: DUONEB   Take 3 mLs by nebulization 4  (four) times daily.      iron polysaccharides 150 MG capsule   Commonly known as: NIFEREX   Take 150 mg by mouth daily. Iron supplementation for your anemia.      mometasone 50 MCG/ACT nasal spray   Commonly known as: NASONEX   Place 2 sprays into the nose daily.      predniSONE 10 MG tablet   Commonly known as: DELTASONE   Take 40mg  po daily for 3 days, then 30mg  po daily for 3 days, then 20mg  po daily for 3 days, then 10mg  po daily for 3 days, then stop      Rivaroxaban 15 MG Tabs tablet   Commonly known as: XARELTO   Take 15 mg by mouth at bedtime.      Tamsulosin HCl 0.4 MG Caps   Commonly known as: FLOMAX   Take 0.8 mg by mouth daily after supper.      tiotropium 18 MCG inhalation capsule   Commonly known as: SPIRIVA   Place 18 mcg into inhaler and inhale daily.      VENTOLIN HFA 108 (90 BASE) MCG/ACT inhaler   Generic drug: albuterol   Inhale 2 puffs into the lungs every 6 (six) hours as needed. Shortness of breath         Disposition   Discharge Orders    Future Appointments: Provider: Department: Dept Phone: Center:   01/01/2013 1:45 PM Brendan Brunswick, MD [facility] [place] [phone] [id number]     Future Orders Please Complete By Expires   Diet - low sodium heart healthy      Increase activity slowly      Discharge instructions      Comments:   * Please take all medications as prescribed and bring them with you to your office visit  * KEEP GROIN SITE CLEAN AND DRY. Call the office for any signs of bleedings, pus, swelling, increased pain, or any other concerns. * NO HEAVY LIFTING (>10lbs) OR SEXUAL ACTIVITY X 7 DAYS. * NO DRIVING X 2-3 DAYS. * NO SOAKING BATHS, HOT TUBS, POOLS, ETC., X 7 DAYS.     Follow-up Information    Follow up with [facility] Bing, MD. In 2 weeks. (Our office will call you with your appointment time)    Contact information:   [facility] [address] [place] [address] [phone]            Outstanding Labs/Studies:  None  Duration of Discharge Encounter: Greater than 30 minutes including physician and PA time.  Signed, Brendan Franko PA-C 12/05/2012, 10:24 AM

## 2012-12-05 NOTE — Progress Notes (Signed)
Hypoglycemic Event  CBG: 65  Treatment: one tube of glucose gel at 0115  Symptoms: None  Follow-up CBG: Time:0145 CBG Result:94  Possible Reasons for Event: Inadequate meal intake too much lantus  Comments/MD notified:will cont to monitor during shift    Brendan Hall  Remember to initiate Hypoglycemia Order Set & complete

## 2012-12-05 NOTE — Progress Notes (Signed)
   Primary cardiologist: Dr. [place] Bing Electrophysiologist: Dr. Lewayne Bunting  Subjective:   Up in chair, no breathlessness, palpitations, or chest pain.   Objective:   Temp:  [97.3 F (36.3 C)-98.2 F (36.8 C)] 97.8 F (36.6 C) (01/11 0744) Pulse Rate:  [67-92] 89  (01/11 0744) Resp:  [18-35] 26  (01/11 0744) BP: (108-130)/(54-69) 117/56 mmHg (01/11 0744) SpO2:  [91 %-99 %] 94 % (01/11 0744) Weight:  [146 lb 9.7 oz (66.5 kg)-150 lb (68.04 kg)] 146 lb 9.7 oz (66.5 kg) (01/11 0030)    Filed Weights   12/04/12 0906 12/05/12 0030  Weight: 150 lb (68.04 kg) 146 lb 9.7 oz (66.5 kg)    Intake/Output Summary (Last 24 hours) at 12/05/12 0828 Last data filed at 12/05/12 0315  Gross per 24 hour  Intake    240 ml  Output   1775 ml  Net  -1535 ml   Telemetry: Sinus rhythm.  Exam:  General: NAD.  Lungs: Diminished throughout, no wheezing.  Cardiac: RRR, distant, no rub or gallop  Extremities: No pitting edema.  Lab Results:  Basic Metabolic Panel:  Lab 12/04/12 1610  NA 136  K 5.3*  CL 98  CO2 26  GLUCOSE 141*  BUN 20  CREATININE 1.15  CALCIUM 9.7  MG --    CBC:  Lab 12/04/12 0921  WBC 5.0  HGB 10.2*  HCT 35.3*  MCV 77.4*  PLT 221    ECG: Sinus rhythm with significant PR prolongation, poor anterior R wave progression, rule out old anterior infarct pattern.   Medications:   Scheduled Medications:    . ipratropium  0.5 mg Nebulization QID   And  . albuterol  2.5 mg Nebulization QID  . amiodarone  200 mg Oral BID  . furosemide  20 mg Oral Daily  . insulin aspart  5 Units Subcutaneous TID WC  . insulin glargine  20 Units Subcutaneous BID  . Rivaroxaban  15 mg Oral QHS  . roflumilast  500 mcg Oral Daily  . Tamsulosin HCl  0.8 mg Oral QPC supper  . cyanocobalamin  1,000 mcg Oral Daily     PRN Medications:  acetaminophen, benzonatate, ondansetron (ZOFRAN) IV, sodium chloride   Assessment:   1. Status post radiofrequency catheter  ablation of atrial flutter on 1/10 by [name].  2. History of atrial flutter recently managed with Xarelto, Cardizem CD, and amiodarone prior to ablation. Now sinus rhythm with prolonged PR interval. Baseline heart rate has increased and 90s off medication.  3. Hypertension.  4. Type 2 diabetes mellitus.  5. COPD with recent bout of pneumonia.  Plan/Discussion:    Patient ready for discharge home today after ambulation. He continues on Xarelto and other baseline medications, except discontinue amiodarone at this point, and resume Cardizem CD 120 mg daily. He will need a followup visit in the [place] office with either Dr. Dietrich Pates or Ms. Lawrence NP over the next 2 weeks.   Jonelle Sidle, M.D., F.A.C.C.

## 2012-12-05 NOTE — Progress Notes (Signed)
Pt's cbg at 0045 was 65(see above note). Pt received 20units of lantus insulin at 1733.  Order is for 20 units bid.  The 2200 dose was not given.  As per report, pt normally only takes 10 units lantus bid.  Order needs to be verified with family prior to discharge. Pt received one tube of glucose gel at 0115 after drinking 1/2 cup juice.  Cbgs throughout shift were 94@0145 , 104@0236  and 87@0530 .  No other interventions were needed.  Pt never exhibited any symptoms of hypoglycemia.  Will continue to monitor.

## 2012-12-06 ENCOUNTER — Emergency Department (HOSPITAL_COMMUNITY)
Admission: EM | Admit: 2012-12-06 | Discharge: 2012-12-06 | Disposition: A | Discharge status: Home / Self Care | Payer: Medicare Other | Attending: Emergency Medicine | Admitting: Emergency Medicine

## 2012-12-06 ENCOUNTER — Encounter (HOSPITAL_COMMUNITY): Payer: Self-pay

## 2012-12-06 DIAGNOSIS — Z860100 Personal history of colon polyps, unspecified: Secondary | ICD-10-CM | POA: Insufficient documentation

## 2012-12-06 DIAGNOSIS — Z79899 Other long term (current) drug therapy: Secondary | ICD-10-CM | POA: Insufficient documentation

## 2012-12-06 DIAGNOSIS — I509 Heart failure, unspecified: Secondary | ICD-10-CM | POA: Insufficient documentation

## 2012-12-06 DIAGNOSIS — Z862 Personal history of diseases of the blood and blood-forming organs and certain disorders involving the immune mechanism: Secondary | ICD-10-CM | POA: Insufficient documentation

## 2012-12-06 DIAGNOSIS — J449 Chronic obstructive pulmonary disease, unspecified: Secondary | ICD-10-CM | POA: Insufficient documentation

## 2012-12-06 DIAGNOSIS — Z8679 Personal history of other diseases of the circulatory system: Secondary | ICD-10-CM | POA: Insufficient documentation

## 2012-12-06 DIAGNOSIS — Z85118 Personal history of other malignant neoplasm of bronchus and lung: Secondary | ICD-10-CM | POA: Insufficient documentation

## 2012-12-06 DIAGNOSIS — E785 Hyperlipidemia, unspecified: Secondary | ICD-10-CM | POA: Insufficient documentation

## 2012-12-06 DIAGNOSIS — Z794 Long term (current) use of insulin: Secondary | ICD-10-CM | POA: Insufficient documentation

## 2012-12-06 DIAGNOSIS — J4489 Other specified chronic obstructive pulmonary disease: Secondary | ICD-10-CM | POA: Insufficient documentation

## 2012-12-06 DIAGNOSIS — Z8709 Personal history of other diseases of the respiratory system: Secondary | ICD-10-CM | POA: Insufficient documentation

## 2012-12-06 DIAGNOSIS — T839XXA Unspecified complication of genitourinary prosthetic device, implant and graft, initial encounter: Secondary | ICD-10-CM

## 2012-12-06 DIAGNOSIS — Z8739 Personal history of other diseases of the musculoskeletal system and connective tissue: Secondary | ICD-10-CM | POA: Insufficient documentation

## 2012-12-06 DIAGNOSIS — E119 Type 2 diabetes mellitus without complications: Secondary | ICD-10-CM | POA: Insufficient documentation

## 2012-12-06 DIAGNOSIS — Y838 Other surgical procedures as the cause of abnormal reaction of the patient, or of later complication, without mention of misadventure at the time of the procedure: Secondary | ICD-10-CM | POA: Insufficient documentation

## 2012-12-06 DIAGNOSIS — T8389XA Other specified complication of genitourinary prosthetic devices, implants and grafts, initial encounter: Secondary | ICD-10-CM | POA: Insufficient documentation

## 2012-12-06 DIAGNOSIS — I10 Essential (primary) hypertension: Secondary | ICD-10-CM | POA: Insufficient documentation

## 2012-12-06 NOTE — ED Provider Notes (Signed)
History     CSN: [id number]  Arrival date & time 12/06/12  0000   First MD Initiated Contact with Patient 12/06/12 0041      Chief Complaint  Patient presents with  . urinary cath leaking     (Consider location/radiation/quality/duration/timing/severity/associated sxs/prior treatment) HPIJames L Hall is a 79 y.o. male red a Foley catheter placed for BPH, presents with leaking around insertion site. Patient does not have any pain in his penis, no fevers or chills, no abdominal pain, no dizziness, lightheadedness, no shortness of breath. Patient like Foley catheter changed.   Past Medical History  Diagnosis Date  . Arteriosclerotic cardiovascular disease (ASCVD)     Myocardial infarction in 1980; chronic dyspnea on exertion; 04/2012-normal EF by echo  . Hyperlipidemia   . Hypertension   . Diabetes mellitus, type II   . Arthritis   . COPD (chronic obstructive pulmonary disease)     Home oxygen  . Colon polyps 08/2011    Colonoscopy, Dr. Karilyn Cota.  Marland Kitchen BPH (benign prostatic hyperplasia)   . Urinary retention 11/15/2011  . Renal mass, right 11/16/2011  . Iron deficiency anemia 11/14/2011  . Atrial flutter with rapid ventricular response 04/29/2012    s/p RFCA 12/04/12  . Chronic respiratory failure with hypoxia 11/09/2012  . Pulmonary nodules 11/09/2012  . Lung cancer   . CHF (congestive heart failure)     Past Surgical History  Procedure Date  . Soft tissue cyst excision     Face  . Colonoscopy 08/28/2011    Procedure: COLONOSCOPY;  Surgeon: Malissa Hippo, MD;  [facility]: [facility];  Service: Endoscopy;  Laterality: N/A;  . Radiofrequency ablation   . Ablation of dysrhythmic focus 12/04/2012    Family History  Problem Relation Age of Onset  . Cancer Mother   . Cancer Sister   . Cancer Brother     History  Substance Use Topics  . Smoking status: Former Smoker -- 1.5 packs/day for 60 years    Types: Cigarettes    Quit date: 08/06/2008  . Smokeless tobacco:  Never Used  . Alcohol Use: No     Comment: occasional       Review of Systems At least 10pt or greater review of systems completed and are negative except where specified in the HPI.  Allergies  Review of patient's allergies indicates no known allergies.  Home Medications   Current Outpatient Rx  Name  Route  Sig  Dispense  Refill  . ALBUTEROL SULFATE HFA 108 (90 BASE) MCG/ACT IN AERS   Inhalation   Inhale 2 puffs into the lungs every 6 (six) hours as needed. Shortness of breath         . BENZONATATE 100 MG PO CAPS   Oral   Take 1 capsule (100 mg total) by mouth 3 (three) times daily as needed for cough.   20 capsule   0   . CYANOCOBALAMIN 1000 MCG PO TABS   Oral   Take 1,000 mcg by mouth daily.         Marland Kitchen DIGOXIN 0.125 MG PO TABS   Oral   Take 1 tablet (0.125 mg total) by mouth daily.   30 tablet   0   . DILTIAZEM HCL ER COATED BEADS 120 MG PO CP24   Oral   Take 1 capsule (120 mg total) by mouth daily.   30 capsule   6   . FLUTICASONE-SALMETEROL 250-50 MCG/DOSE IN AEPB   Inhalation   Inhale 1 puff  into the lungs every 12 (twelve) hours.           . FUROSEMIDE 40 MG PO TABS   Oral   Take 20 mg by mouth daily.         . INSULIN ASPART 100 UNIT/ML [place] SOLN   Subcutaneous   Inject 5 Units into the skin 3 (three) times daily with meals.   1 vial   1   . INSULIN GLARGINE 100 UNIT/ML [place] SOLN   Subcutaneous   Inject 10 Units into the skin 2 (two) times daily.   10 mL      . IPRATROPIUM-ALBUTEROL 0.5-2.5 (3) MG/3ML IN SOLN   Nebulization   Take 3 mLs by nebulization 4 (four) times daily.         Marland Kitchen POLYSACCHARIDE IRON COMPLEX 150 MG PO CAPS   Oral   Take 150 mg by mouth daily. Iron supplementation for your anemia.         Marland Kitchen MOMETASONE FUROATE 50 MCG/ACT NA SUSP   Nasal   Place 2 sprays into the nose daily.         Marland Kitchen PREDNISONE 10 MG PO TABS      Take 40mg  po daily for 3 days, then 30mg  po daily for 3 days, then 20mg  po daily for 3 days,  then 10mg  po daily for 3 days, then stop   30 tablet   0   . RIVAROXABAN 15 MG PO TABS   Oral   Take 15 mg by mouth at bedtime.          . ROFLUMILAST 500 MCG PO TABS   Oral   Take 500 mcg by mouth daily.         Marland Kitchen TAMSULOSIN HCL 0.4 MG PO CAPS   Oral   Take 0.8 mg by mouth daily after supper.         Marland Kitchen TIOTROPIUM BROMIDE MONOHYDRATE 18 MCG IN CAPS   Inhalation   Place 18 mcg into inhaler and inhale daily.           There were no vitals taken for this visit.  Physical Exam  Nursing notes reviewed.  Electronic medical record reviewed. VITAL SIGNS:  There were no vitals filed for this visit. CONSTITUTIONAL: Awake, oriented, appears non-toxic HENT: Atraumatic, normocephalic, oral mucosa pink and moist, airway patent. Nares patent without drainage. External ears normal. EYES: Conjunctiva clear, EOMI, PERRLA NECK: Trachea midline, non-tender, supple CARDIOVASCULAR: Normal heart rate, Normal rhythm, No murmurs, rubs, gallops PULMONARY/CHEST: No rhonchi or rales, no scattered wheezes throughout. Good air movement. Symmetrical breath sounds. Non-tender. ABDOMINAL: Non-distended, soft, non-tender - no rebound or guarding.  BS normal. GU: Normal male with new Foley catheter and no leaking seen. No sores or ulcerations NEUROLOGIC: Non-focal, moving all four extremities, no gross sensory or motor deficits. EXTREMITIES: No clubbing, cyanosis, or edema SKIN: Warm, Dry, No erythema, No rash  ED Course  Procedures (including critical care time)  Labs Reviewed - No data to display No results found.   1. Foley catheter problem       MDM  Brendan Hall is a 79 y.o. male presents for Foley catheter change. No other complaints at this time. Patient has COPD and is on oxygen at home - patient is chronically tachypneic as seen by prior ED visits.  Patient feels fine otherwise, Foley successfully changed he is discharged home stable and in good condition-he does have  followup with urologist this week. . I explained the diagnosis  and have given explicit precautions to return to the ER including any other new or worsening symptoms. The patient understands and accepts the medical plan as it's been dictated and I have answered their questions. Discharge instructions concerning home care and prescriptions have been given.  The patient is STABLE and is discharged to home in good condition.          Jones Skene, MD 12/06/12 0330

## 2012-12-06 NOTE — ED Notes (Signed)
Pt has foley cath that is leaking around insertion site

## 2012-12-07 NOTE — Progress Notes (Signed)
Utilization Review Completed.   [place] Delauder, RN, BSN Nurse Case Manager  [phone]  

## 2012-12-11 NOTE — Addendum Note (Signed)
Addended by: Maicol Bowland, [place] N on: 12/11/2012 04:24 PM   Modules accepted: Orders

## 2012-12-12 ENCOUNTER — Emergency Department (HOSPITAL_COMMUNITY)
Admission: EM | Admit: 2012-12-12 | Discharge: 2012-12-12 | Disposition: A | Discharge status: Home / Self Care | Payer: Medicare Other | Attending: Emergency Medicine | Admitting: Emergency Medicine

## 2012-12-12 ENCOUNTER — Encounter (HOSPITAL_COMMUNITY): Payer: Self-pay | Admitting: *Deleted

## 2012-12-12 DIAGNOSIS — Z87448 Personal history of other diseases of urinary system: Secondary | ICD-10-CM | POA: Insufficient documentation

## 2012-12-12 DIAGNOSIS — N138 Other obstructive and reflux uropathy: Secondary | ICD-10-CM | POA: Insufficient documentation

## 2012-12-12 DIAGNOSIS — N401 Enlarged prostate with lower urinary tract symptoms: Secondary | ICD-10-CM | POA: Insufficient documentation

## 2012-12-12 DIAGNOSIS — E119 Type 2 diabetes mellitus without complications: Secondary | ICD-10-CM | POA: Insufficient documentation

## 2012-12-12 DIAGNOSIS — Z7901 Long term (current) use of anticoagulants: Secondary | ICD-10-CM | POA: Insufficient documentation

## 2012-12-12 DIAGNOSIS — Z8709 Personal history of other diseases of the respiratory system: Secondary | ICD-10-CM | POA: Insufficient documentation

## 2012-12-12 DIAGNOSIS — Z860100 Personal history of colon polyps, unspecified: Secondary | ICD-10-CM | POA: Insufficient documentation

## 2012-12-12 DIAGNOSIS — I4891 Unspecified atrial fibrillation: Secondary | ICD-10-CM | POA: Insufficient documentation

## 2012-12-12 DIAGNOSIS — J9610 Chronic respiratory failure, unspecified whether with hypoxia or hypercapnia: Secondary | ICD-10-CM | POA: Insufficient documentation

## 2012-12-12 DIAGNOSIS — Z87891 Personal history of nicotine dependence: Secondary | ICD-10-CM | POA: Insufficient documentation

## 2012-12-12 DIAGNOSIS — J4489 Other specified chronic obstructive pulmonary disease: Secondary | ICD-10-CM | POA: Insufficient documentation

## 2012-12-12 DIAGNOSIS — I252 Old myocardial infarction: Secondary | ICD-10-CM | POA: Insufficient documentation

## 2012-12-12 DIAGNOSIS — C3490 Malignant neoplasm of unspecified part of unspecified bronchus or lung: Secondary | ICD-10-CM | POA: Insufficient documentation

## 2012-12-12 DIAGNOSIS — J984 Other disorders of lung: Secondary | ICD-10-CM | POA: Insufficient documentation

## 2012-12-12 DIAGNOSIS — I2510 Atherosclerotic heart disease of native coronary artery without angina pectoris: Secondary | ICD-10-CM | POA: Insufficient documentation

## 2012-12-12 DIAGNOSIS — M129 Arthropathy, unspecified: Secondary | ICD-10-CM | POA: Insufficient documentation

## 2012-12-12 DIAGNOSIS — D649 Anemia, unspecified: Secondary | ICD-10-CM | POA: Insufficient documentation

## 2012-12-12 DIAGNOSIS — R339 Retention of urine, unspecified: Secondary | ICD-10-CM | POA: Insufficient documentation

## 2012-12-12 DIAGNOSIS — E785 Hyperlipidemia, unspecified: Secondary | ICD-10-CM | POA: Insufficient documentation

## 2012-12-12 DIAGNOSIS — J449 Chronic obstructive pulmonary disease, unspecified: Secondary | ICD-10-CM | POA: Insufficient documentation

## 2012-12-12 DIAGNOSIS — Z79899 Other long term (current) drug therapy: Secondary | ICD-10-CM | POA: Insufficient documentation

## 2012-12-12 DIAGNOSIS — I509 Heart failure, unspecified: Secondary | ICD-10-CM | POA: Insufficient documentation

## 2012-12-12 DIAGNOSIS — Z794 Long term (current) use of insulin: Secondary | ICD-10-CM | POA: Insufficient documentation

## 2012-12-12 DIAGNOSIS — IMO0002 Reserved for concepts with insufficient information to code with codable children: Secondary | ICD-10-CM | POA: Insufficient documentation

## 2012-12-12 DIAGNOSIS — I10 Essential (primary) hypertension: Secondary | ICD-10-CM | POA: Insufficient documentation

## 2012-12-12 LAB — URINALYSIS, ROUTINE W REFLEX MICROSCOPIC
Hgb urine dipstick: NEGATIVE
Leukocytes, UA: NEGATIVE
Nitrite: NEGATIVE
Protein, ur: NEGATIVE mg/dL
Specific Gravity, Urine: <1.005 — ABNORMAL LOW (ref 1.005–1.030)
Urobilinogen, UA: 0.2 mg/dL (ref 0.0–1.0)

## 2012-12-12 NOTE — ED Provider Notes (Signed)
History   This chart was scribed for Donnetta Hutching, MD by Leone Payor, ED Scribe. This patient was seen in room [facility] and the patient's care was started at 2019.  CSN: [id number]  Arrival date & time 12/12/12  8295   First MD Initiated Contact with Patient 12/12/12 2019      Chief Complaint  Patient presents with  . Urinary Retention     The history is provided by the patient. No language interpreter was used.    Brendan Hall is a 79 y.o. male with h/o urinary retention who presents to the Emergency Department complaining of new episode of constant, unchanged urinary retention starting this morning. Pt reports last urinating early this morning and has tried to limit is water intake.   Pt has h/o HTN, DM, hyperlipidemia, urinary retention, renal mass on right.  Pt is a former smoker but denies alcohol use.  Urologist is Dr. Jacquelyne Balint  Past Medical History  Diagnosis Date  . Arteriosclerotic cardiovascular disease (ASCVD)     Myocardial infarction in 1980; chronic dyspnea on exertion; 04/2012-normal EF by echo  . Hyperlipidemia   . Hypertension   . Diabetes mellitus, type II   . Arthritis   . COPD (chronic obstructive pulmonary disease)     Home oxygen  . Colon polyps 08/2011    Colonoscopy, Dr. Karilyn Cota.  Marland Kitchen BPH (benign prostatic hyperplasia)   . Urinary retention 11/15/2011  . Renal mass, right 11/16/2011  . Iron deficiency anemia 11/14/2011  . Atrial flutter with rapid ventricular response 04/29/2012    s/p RFCA 12/04/12  . Chronic respiratory failure with hypoxia 11/09/2012  . Pulmonary nodules 11/09/2012  . Lung cancer   . CHF (congestive heart failure)     Past Surgical History  Procedure Date  . Soft tissue cyst excision     Face  . Colonoscopy 08/28/2011    Procedure: COLONOSCOPY;  Surgeon: Malissa Hippo, MD;  [facility]: [facility];  Service: Endoscopy;  Laterality: N/A;  . Radiofrequency ablation   . Ablation of dysrhythmic focus 12/04/2012     Family History  Problem Relation Age of Onset  . Cancer Mother   . Cancer Sister   . Cancer Brother     History  Substance Use Topics  . Smoking status: Former Smoker -- 1.5 packs/day for 60 years    Types: Cigarettes    Quit date: 08/06/2008  . Smokeless tobacco: Never Used  . Alcohol Use: No     Comment: occasional       Review of Systems  A complete 10 system review of systems was obtained and all systems are negative except as noted in the HPI and PMH.   Allergies  Review of patient's allergies indicates no known allergies.  Home Medications   Current Outpatient Rx  Name  Route  Sig  Dispense  Refill  . ALBUTEROL SULFATE HFA 108 (90 BASE) MCG/ACT IN AERS   Inhalation   Inhale 2 puffs into the lungs every 6 (six) hours as needed. Shortness of breath         . BENZONATATE 100 MG PO CAPS   Oral   Take 1 capsule (100 mg total) by mouth 3 (three) times daily as needed for cough.   20 capsule   0   . CYANOCOBALAMIN 1000 MCG PO TABS   Oral   Take 1,000 mcg by mouth daily.         Marland Kitchen DIGOXIN 0.125 MG PO TABS   Oral  Take 1 tablet (0.125 mg total) by mouth daily.   30 tablet   0   . DILTIAZEM HCL ER COATED BEADS 120 MG PO CP24   Oral   Take 1 capsule (120 mg total) by mouth daily.   30 capsule   6   . FLUTICASONE-SALMETEROL 250-50 MCG/DOSE IN AEPB   Inhalation   Inhale 1 puff into the lungs every 12 (twelve) hours.           . FUROSEMIDE 40 MG PO TABS   Oral   Take 20 mg by mouth daily.         . INSULIN ASPART 100 UNIT/ML [place] SOLN   Subcutaneous   Inject 5 Units into the skin 3 (three) times daily with meals.   1 vial   1   . INSULIN GLARGINE 100 UNIT/ML [place] SOLN   Subcutaneous   Inject 10 Units into the skin 2 (two) times daily.   10 mL      . IPRATROPIUM-ALBUTEROL 0.5-2.5 (3) MG/3ML IN SOLN   Nebulization   Take 3 mLs by nebulization 4 (four) times daily.         Marland Kitchen POLYSACCHARIDE IRON COMPLEX 150 MG PO CAPS   Oral   Take 150  mg by mouth daily. Iron supplementation for your anemia.         Marland Kitchen MOMETASONE FUROATE 50 MCG/ACT NA SUSP   Nasal   Place 2 sprays into the nose daily.         Marland Kitchen PREDNISONE 10 MG PO TABS      Take 40mg  po daily for 3 days, then 30mg  po daily for 3 days, then 20mg  po daily for 3 days, then 10mg  po daily for 3 days, then stop   30 tablet   0   . RIVAROXABAN 15 MG PO TABS   Oral   Take 15 mg by mouth at bedtime.          . ROFLUMILAST 500 MCG PO TABS   Oral   Take 500 mcg by mouth daily.         Marland Kitchen TAMSULOSIN HCL 0.4 MG PO CAPS   Oral   Take 0.8 mg by mouth daily after supper.         Marland Kitchen TIOTROPIUM BROMIDE MONOHYDRATE 18 MCG IN CAPS   Inhalation   Place 18 mcg into inhaler and inhale daily.           BP 150/68  Pulse 93  Temp 97.9 F (36.6 C)  Resp 20  Ht 5\' 8"  (1.727 m)  Wt 148 lb (67.132 kg)  BMI 22.50 kg/m2  SpO2 93%  Physical Exam  Nursing note and vitals reviewed. Constitutional: He is oriented to person, place, and time. He appears well-developed and well-nourished.  HENT:  Head: Normocephalic and atraumatic.  Eyes: Conjunctivae normal and EOM are normal. Pupils are equal, round, and reactive to light.  Neck: Normal range of motion. Neck supple.  Cardiovascular: Normal rate, regular rhythm and normal heart sounds.   Pulmonary/Chest: Effort normal and breath sounds normal.  Abdominal: Soft. Bowel sounds are normal.  Musculoskeletal: Normal range of motion.  Neurological: He is alert and oriented to person, place, and time.  Skin: Skin is warm and dry.  Psychiatric: He has a normal mood and affect.    ED Course  Procedures (including critical care time)  DIAGNOSTIC STUDIES: Oxygen Saturation is 93% on room air, adequate by my interpretation.    COORDINATION OF CARE:  8:56 PMDiscussed treatment plan which includes UA with pt at bedside and pt agreed to plan.    Labs Reviewed  URINALYSIS, ROUTINE W REFLEX MICROSCOPIC - Abnormal; Notable for  the following:    Specific Gravity, Urine <1.005 (*)     All other components within normal limits  URINALYSIS, MICROSCOPIC ONLY   No results found.   No diagnosis found.    MDM  Foley catheter inserted. Urinary retention relief. Patient has urology followup next week       I personally performed the services described in this documentation, which was scribed in my presence. The recorded information has been reviewed and is accurate.   Donnetta Hutching, MD 12/12/12 2217

## 2012-12-12 NOTE — ED Notes (Signed)
Pt c/o urinary retention since this morning. 

## 2012-12-14 ENCOUNTER — Ambulatory Visit (INDEPENDENT_AMBULATORY_CARE_PROVIDER_SITE_OTHER): Payer: Medicare Other | Admitting: Internal Medicine

## 2012-12-16 ENCOUNTER — Encounter (HOSPITAL_COMMUNITY): Payer: Self-pay | Admitting: *Deleted

## 2012-12-16 ENCOUNTER — Emergency Department (HOSPITAL_COMMUNITY)
Admission: EM | Admit: 2012-12-16 | Discharge: 2012-12-16 | Disposition: A | Discharge status: Home / Self Care | Payer: Medicare Other | Attending: Emergency Medicine | Admitting: Emergency Medicine

## 2012-12-16 DIAGNOSIS — E785 Hyperlipidemia, unspecified: Secondary | ICD-10-CM | POA: Insufficient documentation

## 2012-12-16 DIAGNOSIS — R319 Hematuria, unspecified: Secondary | ICD-10-CM | POA: Insufficient documentation

## 2012-12-16 DIAGNOSIS — I2510 Atherosclerotic heart disease of native coronary artery without angina pectoris: Secondary | ICD-10-CM | POA: Insufficient documentation

## 2012-12-16 DIAGNOSIS — Z8739 Personal history of other diseases of the musculoskeletal system and connective tissue: Secondary | ICD-10-CM | POA: Insufficient documentation

## 2012-12-16 DIAGNOSIS — Z794 Long term (current) use of insulin: Secondary | ICD-10-CM | POA: Insufficient documentation

## 2012-12-16 DIAGNOSIS — I10 Essential (primary) hypertension: Secondary | ICD-10-CM | POA: Insufficient documentation

## 2012-12-16 DIAGNOSIS — Z87448 Personal history of other diseases of urinary system: Secondary | ICD-10-CM | POA: Insufficient documentation

## 2012-12-16 DIAGNOSIS — D509 Iron deficiency anemia, unspecified: Secondary | ICD-10-CM | POA: Insufficient documentation

## 2012-12-16 DIAGNOSIS — N400 Enlarged prostate without lower urinary tract symptoms: Secondary | ICD-10-CM | POA: Insufficient documentation

## 2012-12-16 DIAGNOSIS — Z87891 Personal history of nicotine dependence: Secondary | ICD-10-CM | POA: Insufficient documentation

## 2012-12-16 DIAGNOSIS — J449 Chronic obstructive pulmonary disease, unspecified: Secondary | ICD-10-CM | POA: Insufficient documentation

## 2012-12-16 DIAGNOSIS — I509 Heart failure, unspecified: Secondary | ICD-10-CM | POA: Insufficient documentation

## 2012-12-16 DIAGNOSIS — J4489 Other specified chronic obstructive pulmonary disease: Secondary | ICD-10-CM | POA: Insufficient documentation

## 2012-12-16 DIAGNOSIS — Z860100 Personal history of colon polyps, unspecified: Secondary | ICD-10-CM | POA: Insufficient documentation

## 2012-12-16 DIAGNOSIS — IMO0002 Reserved for concepts with insufficient information to code with codable children: Secondary | ICD-10-CM | POA: Insufficient documentation

## 2012-12-16 DIAGNOSIS — Z85118 Personal history of other malignant neoplasm of bronchus and lung: Secondary | ICD-10-CM | POA: Insufficient documentation

## 2012-12-16 DIAGNOSIS — Z79899 Other long term (current) drug therapy: Secondary | ICD-10-CM | POA: Insufficient documentation

## 2012-12-16 DIAGNOSIS — I4892 Unspecified atrial flutter: Secondary | ICD-10-CM | POA: Insufficient documentation

## 2012-12-16 DIAGNOSIS — Z8709 Personal history of other diseases of the respiratory system: Secondary | ICD-10-CM | POA: Insufficient documentation

## 2012-12-16 LAB — URINALYSIS, ROUTINE W REFLEX MICROSCOPIC
Bilirubin Urine: NEGATIVE
Glucose, UA: NEGATIVE mg/dL
Ketones, ur: NEGATIVE mg/dL
Nitrite: NEGATIVE
Specific Gravity, Urine: 1.018 (ref 1.005–1.030)
pH: 5.5 (ref 5.0–8.0)

## 2012-12-16 LAB — POCT I-STAT, CHEM 8
BUN: 22 mg/dL (ref 6–23)
Calcium, Ion: 1.23 mmol/L (ref 1.13–1.30)
Creatinine, Ser: 1.4 mg/dL — ABNORMAL HIGH (ref 0.50–1.35)
Glucose, Bld: 184 mg/dL — ABNORMAL HIGH (ref 70–99)
Hemoglobin: 10.9 g/dL — ABNORMAL LOW (ref 13.0–17.0)
Sodium: 138 mEq/L (ref 135–145)
TCO2: 29 mmol/L (ref 0–100)

## 2012-12-16 LAB — URINE MICROSCOPIC-ADD ON

## 2012-12-16 NOTE — ED Provider Notes (Signed)
Medical screening examination/treatment/procedure(s) were conducted as a shared visit with non-physician practitioner(s) and myself.  I personally evaluated the patient during the encounter.  Pt complains of hematuria.  No fevers.  No signs of infection.  Hemoglobin is stable. Has indwelling foley catheter.  May be related to minor trauma.  Rec follow up with urologist.   Celene Kras, MD 12/16/12 [phone]

## 2012-12-16 NOTE — ED Notes (Signed)
Pt has had catheter in place since Saturday; previous catheter in place since before Christmas; noticed small amt of blood in catheter this evening then catheter stopped draining; increased swelling in lower legs this evening also; no c/o pain or fever

## 2012-12-16 NOTE — ED Provider Notes (Signed)
History     CSN: [id number]  Arrival date & time 12/16/12  1953   First MD Initiated Contact with Patient 12/16/12 2012      Chief Complaint  Patient presents with  . Hematuria   HPI  History provided by the patient and family. Patient is a 79 year old male with multiple medical conditions and recently with issues of urinary retention who presents with complaints of hematuria in his leg bag. Patient does have indwelling Foley catheter for the past month or more. Patient is following up with the urologist and had his catheter removed last Friday. The following day he again had symptoms of urinary retention and had a new Foley catheter placed. Patient has been doing well since that time but today noticed some decreased urinary output and small amounts of blood. He denies having any associated pain. Denies any bleeding or drainage around the catheter or penis. Denies any testicular pain or swelling. Denies any abdominal pains. Denies any fever, chills or sweats. Patient does have additional followup with his urologist next Wednesday. Denies any other aggravating or alleviating factors. Denies any other associated symptoms.    Past Medical History  Diagnosis Date  . Arteriosclerotic cardiovascular disease (ASCVD)     Myocardial infarction in 1980; chronic dyspnea on exertion; 04/2012-normal EF by echo  . Hyperlipidemia   . Hypertension   . Diabetes mellitus, type II   . Arthritis   . COPD (chronic obstructive pulmonary disease)     Home oxygen  . Colon polyps 08/2011    Colonoscopy, Dr. Karilyn Cota.  Marland Kitchen BPH (benign prostatic hyperplasia)   . Urinary retention 11/15/2011  . Renal mass, right 11/16/2011  . Iron deficiency anemia 11/14/2011  . Atrial flutter with rapid ventricular response 04/29/2012    s/p RFCA 12/04/12  . Chronic respiratory failure with hypoxia 11/09/2012  . Pulmonary nodules 11/09/2012  . Lung cancer   . CHF (congestive heart failure)     Past Surgical History    Procedure Date  . Soft tissue cyst excision     Face  . Colonoscopy 08/28/2011    Procedure: COLONOSCOPY;  Surgeon: Malissa Hippo, MD;  Location: [facility];  Service: Endoscopy;  Laterality: N/A;  . Radiofrequency ablation   . Ablation of dysrhythmic focus 12/04/2012    Family History  Problem Relation Age of Onset  . Cancer Mother   . Cancer Sister   . Cancer Brother     History  Substance Use Topics  . Smoking status: Former Smoker -- 1.5 packs/day for 60 years    Types: Cigarettes    Quit date: 08/06/2008  . Smokeless tobacco: Never Used  . Alcohol Use: No     Comment: occasional       Review of Systems  Constitutional: Negative for fever, chills and diaphoresis.  Gastrointestinal: Negative for nausea, vomiting and abdominal pain.  Genitourinary: Positive for hematuria. Negative for dysuria, flank pain, penile swelling, scrotal swelling, penile pain and testicular pain.  All other systems reviewed and are negative.    Allergies  Review of patient's allergies indicates no known allergies.  Home Medications   Current Outpatient Rx  Name  Route  Sig  Dispense  Refill  . ALBUTEROL SULFATE HFA 108 (90 BASE) MCG/ACT IN AERS   Inhalation   Inhale 2 puffs into the lungs every 6 (six) hours as needed. Shortness of breath         . CYANOCOBALAMIN 1000 MCG PO TABS   Oral   Take  1,000 mcg by mouth daily.         Marland Kitchen DILTIAZEM HCL ER COATED BEADS 120 MG PO CP24   Oral   Take 1 capsule (120 mg total) by mouth daily.   30 capsule   6   . FLUTICASONE-SALMETEROL 250-50 MCG/DOSE IN AEPB   Inhalation   Inhale 1 puff into the lungs every 12 (twelve) hours.           . FUROSEMIDE 40 MG PO TABS   Oral   Take 20 mg by mouth daily.         . INSULIN ASPART 100 UNIT/ML [place] SOLN   Subcutaneous   Inject 5 Units into the skin 3 (three) times daily with meals.   1 vial   1   . INSULIN GLARGINE 100 UNIT/ML [place] SOLN   Subcutaneous   Inject 10 Units into the  skin 2 (two) times daily.   10 mL      . IPRATROPIUM-ALBUTEROL 0.5-2.5 (3) MG/3ML IN SOLN   Nebulization   Take 3 mLs by nebulization 4 (four) times daily.         Marland Kitchen POLYSACCHARIDE IRON COMPLEX 150 MG PO CAPS   Oral   Take 150 mg by mouth daily. Iron supplementation for your anemia.         Marland Kitchen MOMETASONE FUROATE 50 MCG/ACT NA SUSP   Nasal   Place 2 sprays into the nose daily.         Marland Kitchen RIVAROXABAN 15 MG PO TABS   Oral   Take 15 mg by mouth at bedtime.          . ROFLUMILAST 500 MCG PO TABS   Oral   Take 500 mcg by mouth daily.         Marland Kitchen SIMVASTATIN 40 MG PO TABS   Oral   Take 40 mg by mouth at bedtime.         . TAMSULOSIN HCL 0.4 MG PO CAPS   Oral   Take 0.8 mg by mouth daily after supper.         Marland Kitchen TIOTROPIUM BROMIDE MONOHYDRATE 18 MCG IN CAPS   Inhalation   Place 18 mcg into inhaler and inhale daily.           BP 129/62  Pulse 80  Temp 98.2 F (36.8 C) (Oral)  Resp 18  SpO2 97%  Physical Exam  Nursing note and vitals reviewed. Constitutional: He is oriented to person, place, and time. He appears well-developed and well-nourished. No distress.  HENT:  Head: Normocephalic.  Cardiovascular: Normal rate and regular rhythm.   Pulmonary/Chest: Effort normal and breath sounds normal.  Abdominal: Soft. There is no tenderness. There is no rebound and no guarding.  Genitourinary:       Foley catheter in place without leaking or drainage around it. There appears to be some small amounts of blood within the Foley leg bag. Currently there is normal pending urinary flow.  Musculoskeletal: Normal range of motion.  Neurological: He is alert and oriented to person, place, and time.  Skin: Skin is warm.  Psychiatric: He has a normal mood and affect. His behavior is normal.    ED Course  Procedures   Results for orders placed during the hospital encounter of 12/16/12  URINALYSIS, ROUTINE W REFLEX MICROSCOPIC      Component Value Range   Color, Urine  YELLOW  YELLOW   APPearance CLEAR  CLEAR   Specific Gravity, Urine 1.018  1.005 -  1.030   pH 5.5  5.0 - 8.0   Glucose, UA NEGATIVE  NEGATIVE mg/dL   Hgb urine dipstick LARGE (*) NEGATIVE   Bilirubin Urine NEGATIVE  NEGATIVE   Ketones, ur NEGATIVE  NEGATIVE mg/dL   Protein, ur NEGATIVE  NEGATIVE mg/dL   Urobilinogen, UA 1.0  0.0 - 1.0 mg/dL   Nitrite NEGATIVE  NEGATIVE   Leukocytes, UA SMALL (*) NEGATIVE  URINE MICROSCOPIC-ADD ON      Component Value Range   Squamous Epithelial / LPF RARE  RARE   WBC, UA 3-6  <3 WBC/hpf   RBC / HPF TOO NUMEROUS TO COUNT  <3 RBC/hpf  POCT I-STAT, CHEM 8      Component Value Range   Sodium 138  135 - 145 mEq/L   Potassium 4.2  3.5 - 5.1 mEq/L   Chloride 101  96 - 112 mEq/L   BUN 22  6 - 23 mg/dL   Creatinine, Ser 1.61 (*) 0.50 - 1.35 mg/dL   Glucose, Bld 096 (*) 70 - 99 mg/dL   Calcium, Ion 0.45  4.09 - 1.30 mmol/L   TCO2 29  0 - 100 mmol/L   Hemoglobin 10.9 (*) 13.0 - 17.0 g/dL   HCT 81.1 (*) 91.4 - 78.2 %        1. Hematuria       MDM  8:25 PM patient seen and evaluated. Patient resting comfortably in bed appears in no acute distress. Patient currently without any complaints or discomfort.   Patient discussed with attending physician. No signs of UTI. H&H at baseline. Will have patient withhold one dose of Xarelto and continue to followup with PCP and urology specialist.     Angus Seller, PA 12/17/12 [phone]

## 2012-12-17 LAB — URINE CULTURE: Colony Count: NO GROWTH

## 2012-12-21 ENCOUNTER — Other Ambulatory Visit: Payer: Self-pay | Admitting: Internal Medicine

## 2012-12-21 NOTE — Telephone Encounter (Signed)
Called [facility] and spoke with rep Bill whom advised pt does have refills for Diltiazem 120mg    medication to take once daily, however noted pt was on 11-19-12 refill for Diltiazem 180mg  BID, left message for pt to clarify if he has been taking the 120mg  BID per noted pt did pick up the 120mg  tablets from pharmacy on 12-05-12, noted D/C summery from [facility] to advise pt medication change as Diltiazem 120mg  ONCE daily

## 2012-12-21 NOTE — Telephone Encounter (Signed)
Needs refill on Diltiazem 120mg  sent to [facility].Marland Kitchen / tgs

## 2012-12-22 MED ORDER — RIVAROXABAN 15 MG PO TABS: 15.0000 mg | ORAL_TABLET | Freq: Every day | ORAL | Status: DC

## 2012-12-22 NOTE — Telephone Encounter (Signed)
.  left message to have patient return my call with pt spouse per pt at MD office at this time

## 2012-12-22 NOTE — Telephone Encounter (Signed)
Pt daughter called to advise that the pt was confused and he does NOT need the refill for the Diltazem 120mg  and pt daughter clarified that he is only taking the 120mg  tablet ONCE daily, she did advise that he will need a refill for his xarelto 15mg  sent to the pharmacy, advised that we will send that out today, pt daughter understood and sent refill via escribe

## 2012-12-31 ENCOUNTER — Telehealth: Payer: Self-pay | Admitting: Cardiology

## 2012-12-31 NOTE — Telephone Encounter (Signed)
Will address at OV tomorrow

## 2012-12-31 NOTE — Telephone Encounter (Signed)
DR. Lavone Nian IS WANTING TO DO TURP(TRANS URETHERAL RESECTION OF PROSTATE)SURGERY, THIS IS NOT SCHEDULED WAITING ON CLEARANCE. THEY NEED TO KNOW HOW LONG TO HOLD XARELTO.   PT IS BEING SEEN IN OFFICE BY DR Dietrich Pates 01/01/13 FOR EPH F/U  THEY ARE FAXING CLEARANCE FORM OVER

## 2013-01-01 ENCOUNTER — Encounter: Payer: Self-pay | Admitting: Cardiology

## 2013-01-01 ENCOUNTER — Ambulatory Visit (INDEPENDENT_AMBULATORY_CARE_PROVIDER_SITE_OTHER): Payer: Medicare Other | Admitting: Cardiology

## 2013-01-01 VITALS — BP 132/70 | HR 76 | Ht 68.0 in | Wt 151.0 lb

## 2013-01-01 DIAGNOSIS — E119 Type 2 diabetes mellitus without complications: Secondary | ICD-10-CM

## 2013-01-01 DIAGNOSIS — R339 Retention of urine, unspecified: Secondary | ICD-10-CM

## 2013-01-01 DIAGNOSIS — I10 Essential (primary) hypertension: Secondary | ICD-10-CM

## 2013-01-01 DIAGNOSIS — N401 Enlarged prostate with lower urinary tract symptoms: Secondary | ICD-10-CM

## 2013-01-01 MED ORDER — LISINOPRIL 10 MG PO TABS: 10.0000 mg | ORAL_TABLET | Freq: Every day | ORAL | Status: DC

## 2013-01-01 NOTE — Patient Instructions (Addendum)
Your physician recommends that you schedule a follow-up appointment in: 3 months  Your physician has recommended you make the following change in your medication:  1 - START Lisinopril 10 mg daily 2 - STOP Diltiazem  Your physician has requested that you regularly monitor and record your blood pressure readings at home. Please use the same machine at the same time of day to check your readings and record them to bring to your follow-up visit.  Call us for blood pressures above 140/90  Your physician recommends that you return for lab work in: today

## 2013-01-01 NOTE — Assessment & Plan Note (Signed)
Due to patient's multiple medical problems, risk of any surgical procedure increased relative to age matched group. Nonetheless, TURP is relatively low risk and will be necessary to relieve urinary obstruction. Warfarin can be discontinued 5 days prior to procedure and need not be resumed thereafter. [facility] will be available to assist with patient's cardiology care as needed.

## 2013-01-01 NOTE — Progress Notes (Signed)
Patient ID: Brendan Hall, male   DOB: 09/11/1930, 79 y.o.   MRN: [id number]  HPI: Scheduled return office visit following uncomplicated radiofrequency ablation for atrial flutter. Patient has done well following the procedure and reports possibly improved general sense of well-being with less malaise and exercise intolerance. He continues to experience class III dyspnea on exertion. He describes recent URI symptoms.  Prior to Admission medications   Medication Sig Start Date End Date Taking? Authorizing Provider  albuterol (VENTOLIN HFA) 108 (90 BASE) MCG/ACT inhaler Inhale 2 puffs into the lungs every 6 (six) hours as needed. Shortness of breath   Yes Historical Provider, MD  cyanocobalamin 1000 MCG tablet Take 1,000 mcg by mouth daily. 05/03/12 05/03/13 Yes Elliot Cousin, MD  Fluticasone-Salmeterol (ADVAIR) 250-50 MCG/DOSE AEPB Inhale 1 puff into the lungs every 12 (twelve) hours.     Yes Historical Provider, MD  furosemide (LASIX) 40 MG tablet Take 20 mg by mouth daily.   Yes Historical Provider, MD  insulin aspart (NOVOLOG) 100 UNIT/ML injection Inject 5 Units into the skin 3 (three) times daily with meals. 11/14/12  Yes Erick Blinks, MD  insulin glargine (LANTUS) 100 UNIT/ML injection Inject 10 Units into the skin 2 (two) times daily. 12/05/12  Yes Jessica A Hope, PA-C  ipratropium-albuterol (DUONEB) 0.5-2.5 (3) MG/3ML SOLN Take 3 mLs by nebulization 4 (four) times daily. 05/03/12  Yes Elliot Cousin, MD  iron polysaccharides (NIFEREX) 150 MG capsule Take 150 mg by mouth daily. Iron supplementation for your anemia. 05/03/12 05/03/13 Yes Elliot Cousin, MD  mometasone (NASONEX) 50 MCG/ACT nasal spray Place 2 sprays into the nose daily.   Yes Historical Provider, MD  Rivaroxaban (XARELTO) 15 MG TABS tablet Take 1 tablet (15 mg total) by mouth at bedtime. 12/22/12  Yes Marinus Maw, MD  roflumilast (DALIRESP) 500 MCG TABS tablet Take 500 mcg by mouth daily.   Yes Historical Provider, MD  simvastatin  (ZOCOR) 40 MG tablet Take 40 mg by mouth at bedtime. 10/29/12  Yes Historical Provider, MD  Tamsulosin HCl (FLOMAX) 0.4 MG CAPS Take 0.8 mg by mouth daily after supper.   Yes Historical Provider, MD  tiotropium (SPIRIVA) 18 MCG inhalation capsule Place 18 mcg into inhaler and inhale daily.   Yes Historical Provider, MD  lisinopril (PRINIVIL,ZESTRIL) 10 MG tablet Take 1 tablet (10 mg total) by mouth daily. 01/01/13   Kathlen Brunswick, MD  No Known Allergies    Past medical history, social history, and family history reviewed and updated.  ROS: Denies chest pain, palpitations, lightheadedness or syncope. All other systems reviewed and are negative.  PHYSICAL EXAM: BP 132/70  Pulse 76  Ht 5\' 8"  (1.727 m)  Wt 68.493 kg (151 lb)  BMI 22.96 kg/m2  SpO2 93%  General-Well developed; no acute distress Body habitus-proportionate weight and height Neck-No JVD; no carotid bruits Lungs-resonant to percussion; inspiratory and expiratory rhonchi; prolonged expiratory phase; decreased breath sounds throughout Cardiovascular-normal PMI; normal S1 and S2 Abdomen-normal bowel sounds; soft and non-tender without masses or organomegaly Musculoskeletal-No deformities, no cyanosis or clubbing Neurologic-Normal cranial nerves; symmetric strength and tone Skin-Warm, no significant lesions Extremities-distal pulses intact; 1+ ankle edema  EKG: Normal sinus rhythm with markedly prolonged PR interval and sinus arrhythmia, PACs, or sinus pauses; low-voltage; delayed R-wave progression-cannot exclude previous septal myocardial infarction; no previous tracing for comparison.  ASSESSMENT AND PLAN:  Anderson Bing, MD 01/01/2013 5:21 PM

## 2013-01-01 NOTE — Progress Notes (Deleted)
Name: Brendan Hall    DOB: 12/30/1929  Age: 79 y.o.  MR#: [id number]       PCP:  Colon Branch, MD      Insurance: @PAYORNAME @   CC:   LIST FLUTTER ABLATION 12-04-12  NEEDS SURGERY CLEARANCE FOR THERAPY-SEE FORM RELATED TO HOLDING XARELTO    VS BP 132/70  Pulse 76  Ht 5\' 8"  (1.727 m)  Wt 151 lb (68.493 kg)  BMI 22.96 kg/m2  SpO2 93%  Weights Current Weight  01/01/13 151 lb (68.493 kg)  12/12/12 148 lb (67.132 kg)  12/05/12 146 lb 9.7 oz (66.5 kg)    Blood Pressure  BP Readings from Last 3 Encounters:  01/01/13 132/70  12/16/12 127/65  12/12/12 150/68     Admit date:  (Not on file) Last encounter with RMR:  12/31/2012   Allergy No Known Allergies  Current Outpatient Prescriptions  Medication Sig Dispense Refill  . albuterol (VENTOLIN HFA) 108 (90 BASE) MCG/ACT inhaler Inhale 2 puffs into the lungs every 6 (six) hours as needed. Shortness of breath      . cyanocobalamin 1000 MCG tablet Take 1,000 mcg by mouth daily.      Marland Kitchen diltiazem (CARDIZEM CD) 120 MG 24 hr capsule Take 1 capsule (120 mg total) by mouth daily.  30 capsule  6  . Fluticasone-Salmeterol (ADVAIR) 250-50 MCG/DOSE AEPB Inhale 1 puff into the lungs every 12 (twelve) hours.        . furosemide (LASIX) 40 MG tablet Take 20 mg by mouth daily.      . insulin aspart (NOVOLOG) 100 UNIT/ML injection Inject 5 Units into the skin 3 (three) times daily with meals.  1 vial  1  . insulin glargine (LANTUS) 100 UNIT/ML injection Inject 10 Units into the skin 2 (two) times daily.  10 mL    . ipratropium-albuterol (DUONEB) 0.5-2.5 (3) MG/3ML SOLN Take 3 mLs by nebulization 4 (four) times daily.      . iron polysaccharides (NIFEREX) 150 MG capsule Take 150 mg by mouth daily. Iron supplementation for your anemia.      . mometasone (NASONEX) 50 MCG/ACT nasal spray Place 2 sprays into the nose daily.      . Rivaroxaban (XARELTO) 15 MG TABS tablet Take 1 tablet (15 mg total) by mouth at bedtime.  30 tablet  3  . roflumilast  (DALIRESP) 500 MCG TABS tablet Take 500 mcg by mouth daily.      . simvastatin (ZOCOR) 40 MG tablet Take 40 mg by mouth at bedtime.      . Tamsulosin HCl (FLOMAX) 0.4 MG CAPS Take 0.8 mg by mouth daily after supper.      . tiotropium (SPIRIVA) 18 MCG inhalation capsule Place 18 mcg into inhaler and inhale daily.       No current facility-administered medications for this visit.   Facility-Administered Medications Ordered in Other Visits  Medication Dose Route Frequency Provider Last Rate Last Dose  . diltiazem (CARDIZEM) injection 20 mg  20 mg Intravenous PRN Kathlen Brunswick, MD        Discontinued Meds:   There are no discontinued medications.  Patient Active Problem List  Diagnosis  . DM type 2 (diabetes mellitus, type 2)  . History of lung cancer  . Urinary retention  . Renal cysts, acquired, bilateral  . Atrial flutter  . Lung mass  . CKD (chronic kidney disease), stage III  . Anemia due to chronic blood loss  . Chronic respiratory failure with hypoxia  .  Pulmonary nodules    LABS Admission on 12/16/2012, Discharged on 12/16/2012  Component Date Value  . Color, Urine 12/16/2012 YELLOW   . APPearance 12/16/2012 CLEAR   . Specific Gravity, Urine 12/16/2012 1.018   . pH 12/16/2012 5.5   . Glucose, UA 12/16/2012 NEGATIVE   . Hgb urine dipstick 12/16/2012 LARGE*  . Bilirubin Urine 12/16/2012 NEGATIVE   . Ketones, ur 12/16/2012 NEGATIVE   . Protein, ur 12/16/2012 NEGATIVE   . Urobilinogen, UA 12/16/2012 1.0   . Nitrite 12/16/2012 NEGATIVE   . Leukocytes, UA 12/16/2012 SMALL*  . Squamous Epithelial / LPF 12/16/2012 RARE   . WBC, UA 12/16/2012 3-6   . RBC / HPF 12/16/2012 TOO NUMEROUS TO COUNT   . Specimen Description 12/16/2012 URINE, CATHETERIZED   . Special Requests 12/16/2012 NONE   . Culture  Setup Time 12/16/2012 12/17/2012 01:25   . Colony Count 12/16/2012 NO GROWTH   . Culture 12/16/2012 NO GROWTH   . Report Status 12/16/2012 12/17/2012 FINAL   . Sodium  12/16/2012 138   . Potassium 12/16/2012 4.2   . Chloride 12/16/2012 101   . BUN 12/16/2012 22   . Creatinine, Ser 12/16/2012 1.40*  . Glucose, Bld 12/16/2012 184*  . Calcium, Ion 12/16/2012 1.23   . TCO2 12/16/2012 29   . Hemoglobin 12/16/2012 10.9*  . HCT 12/16/2012 32.0*  Admission on 12/12/2012, Discharged on 12/12/2012  Component Date Value  . Color, Urine 12/12/2012 YELLOW   . APPearance 12/12/2012 CLEAR   . Specific Gravity, Urine 12/12/2012 <1.005*  . pH 12/12/2012 6.0   . Glucose, UA 12/12/2012 NEGATIVE   . Hgb urine dipstick 12/12/2012 NEGATIVE   . Bilirubin Urine 12/12/2012 NEGATIVE   . Ketones, ur 12/12/2012 NEGATIVE   . Protein, ur 12/12/2012 NEGATIVE   . Urobilinogen, UA 12/12/2012 0.2   . Nitrite 12/12/2012 NEGATIVE   . Leukocytes, UA 12/12/2012 NEGATIVE   Admission on 12/04/2012, Discharged on 12/05/2012  Component Date Value  . WBC 12/04/2012 5.0   . RBC 12/04/2012 4.56   . Hemoglobin 12/04/2012 10.2*  . HCT 12/04/2012 35.3*  . MCV 12/04/2012 77.4*  . MCH 12/04/2012 22.4*  . MCHC 12/04/2012 28.9*  . RDW 12/04/2012 23.9*  . Platelets 12/04/2012 221   . Sodium 12/04/2012 136   . Potassium 12/04/2012 5.3*  . Chloride 12/04/2012 98   . CO2 12/04/2012 26   . Glucose, Bld 12/04/2012 141*  . BUN 12/04/2012 20   . Creatinine, Ser 12/04/2012 1.15   . Calcium 12/04/2012 9.7   . GFR calc non Af Amer 12/04/2012 58*  . GFR calc Af Amer 12/04/2012 67*  . Glucose-Capillary 12/04/2012 141*  . Glucose-Capillary 12/04/2012 126*  . Glucose-Capillary 12/04/2012 323*  . Glucose-Capillary 12/04/2012 264*  . Glucose-Capillary 12/04/2012 108*  . Comment 1 12/04/2012 Documented in Chart   . Comment 2 12/04/2012 Notify RN   . Glucose-Capillary 12/05/2012 65*  . Comment 1 12/05/2012 Documented in Chart   . Comment 2 12/05/2012 Notify RN   . Glucose-Capillary 12/05/2012 94   . Glucose-Capillary 12/05/2012 104*  . Glucose-Capillary 12/05/2012 87   . Glucose-Capillary  12/05/2012 105*  Admission on 11/28/2012, Discharged on 11/28/2012  Component Date Value  . Color, Urine 11/28/2012 YELLOW   . APPearance 11/28/2012 CLEAR   . Specific Gravity, Urine 11/28/2012 1.015   . pH 11/28/2012 7.5   . Glucose, UA 11/28/2012 NEGATIVE   . Hgb urine dipstick 11/28/2012 LARGE*  . Bilirubin Urine 11/28/2012 NEGATIVE   . Ketones, ur 11/28/2012  NEGATIVE   . Protein, ur 11/28/2012 NEGATIVE   . Urobilinogen, UA 11/28/2012 0.2   . Nitrite 11/28/2012 NEGATIVE   . Leukocytes, UA 11/28/2012 MODERATE*  . WBC, UA 11/28/2012 3-6   . RBC / HPF 11/28/2012 0-2   . Specimen Description 11/28/2012 URINE, CATHETERIZED   . Special Requests 11/28/2012 NONE   . Culture  Setup Time 11/28/2012 11/28/2012 21:33   . Colony Count 11/28/2012 NO GROWTH   . Culture 11/28/2012 NO GROWTH   . Report Status 11/28/2012 11/29/2012 FINAL   Office Visit on 11/24/2012  Component Date Value  . WBC 11/24/2012 9.8   . RBC 11/24/2012 4.64   . Hemoglobin 11/24/2012 10.2*  . HCT 11/24/2012 34.4*  . MCV 11/24/2012 74.1*  . [facility] 11/24/2012 22.0*  . MCHC 11/24/2012 29.7*  . RDW 11/24/2012 20.7*  . Platelets 11/24/2012 294   . Neutrophils Relative 11/24/2012 91*  . Neutro Abs 11/24/2012 8.9*  . Lymphocytes Relative 11/24/2012 5*  . Lymphs Abs 11/24/2012 0.5*  . Monocytes Relative 11/24/2012 4   . Monocytes Absolute 11/24/2012 0.4   . Eosinophils Relative 11/24/2012 0   . Eosinophils Absolute 11/24/2012 0.0   . Basophils Relative 11/24/2012 0   . Basophils Absolute 11/24/2012 0.0   . Smear Review 11/24/2012 Criteria for review not met   . Sodium 11/24/2012 137   . Potassium 11/24/2012 5.2   . Chloride 11/24/2012 94*  . CO2 11/24/2012 29   . Glucose, Bld 11/24/2012 296*  . BUN 11/24/2012 32*  . Creat 11/24/2012 1.62*  . Calcium 11/24/2012 9.5   Admission on 11/16/2012, Discharged on 11/16/2012  Component Date Value  . Color, Urine 11/16/2012 YELLOW   . APPearance 11/16/2012 CLEAR   .  Specific Gravity, Urine 11/16/2012 1.015   . pH 11/16/2012 5.5   . Glucose, UA 11/16/2012 NEGATIVE   . Hgb urine dipstick 11/16/2012 TRACE*  . Bilirubin Urine 11/16/2012 NEGATIVE   . Ketones, ur 11/16/2012 NEGATIVE   . Protein, ur 11/16/2012 NEGATIVE   . Urobilinogen, UA 11/16/2012 0.2   . Nitrite 11/16/2012 NEGATIVE   . Leukocytes, UA 11/16/2012 NEGATIVE   . RBC / HPF 11/16/2012 0-2   Admission on 11/08/2012, Discharged on 11/14/2012  No results displayed because visit has over 200 results.       Results for this Opt Visit:     Results for orders placed during the hospital encounter of 12/16/12  URINALYSIS, ROUTINE W REFLEX MICROSCOPIC      Component Value Range   Color, Urine YELLOW  YELLOW   APPearance CLEAR  CLEAR   Specific Gravity, Urine 1.018  1.005 - 1.030   pH 5.5  5.0 - 8.0   Glucose, UA NEGATIVE  NEGATIVE mg/dL   Hgb urine dipstick LARGE (*) NEGATIVE   Bilirubin Urine NEGATIVE  NEGATIVE   Ketones, ur NEGATIVE  NEGATIVE mg/dL   Protein, ur NEGATIVE  NEGATIVE mg/dL   Urobilinogen, UA 1.0  0.0 - 1.0 mg/dL   Nitrite NEGATIVE  NEGATIVE   Leukocytes, UA SMALL (*) NEGATIVE  URINE MICROSCOPIC-ADD ON      Component Value Range   Squamous Epithelial / LPF RARE  RARE   WBC, UA 3-6  <3 WBC/hpf   RBC / HPF TOO NUMEROUS TO COUNT  <3 RBC/hpf  URINE CULTURE      Component Value Range   Specimen Description URINE, CATHETERIZED     Special Requests NONE     Culture  Setup Time 12/17/2012 01:25  Colony Count NO GROWTH     Culture NO GROWTH     Report Status 12/17/2012 FINAL    POCT I-STAT, CHEM 8      Component Value Range   Sodium 138  135 - 145 mEq/L   Potassium 4.2  3.5 - 5.1 mEq/L   Chloride 101  96 - 112 mEq/L   BUN 22  6 - 23 mg/dL   Creatinine, Ser 1.61 (*) 0.50 - 1.35 mg/dL   Glucose, Bld 096 (*) 70 - 99 mg/dL   Calcium, Ion 0.45  4.09 - 1.30 mmol/L   TCO2 29  0 - 100 mmol/L   Hemoglobin 10.9 (*) 13.0 - 17.0 g/dL   HCT 81.1 (*) 91.4 - 78.2 %     EKG Orders placed in visit on 01/01/13  . EKG 12-LEAD     Prior Assessment and Plan Problem List as of 01/01/2013          DM type 2 (diabetes mellitus, type 2)   History of lung cancer   Last Assessment & Plan Note   06/18/2012 Office Visit Signed 06/18/2012 11:32 AM by Jodelle Gross, NP    Abnormal CT scan as discussed above. He is encouraged to keep appointment with Dr. Juanetta Gosling for continue pulmonary management. He may need referral to oncologist once evaluated with his history of lung CA.    Urinary retention   Renal cysts, acquired, bilateral   Atrial flutter   Last Assessment & Plan Note   11/24/2012 Office Visit Signed 11/26/2012  1:53 PM by Marinus Maw, MD    The patient has persistent atrial flutter despite medical therapy. I've discussed the risk, benefits, goals, and expectations of catheter ablation and he wishes to proceed. He will continue his current anticoagulation regimen.    Lung mass   Last Assessment & Plan Note   10/27/2012 Office Visit Signed 10/27/2012  2:08 PM by Jodelle Gross, NP    This is being followed for ongoing assessment via Dr. Juanetta Gosling. We will defer to him and oncology for consideration of treatment.    CKD (chronic kidney disease), stage III   Anemia due to chronic blood loss   Chronic respiratory failure with hypoxia   Pulmonary nodules       Imaging: No results found.   FRS Calculation: Score not calculated

## 2013-01-02 LAB — MICROALBUMIN / CREATININE URINE RATIO: Creatinine, Urine: 67 mg/dL

## 2013-01-04 ENCOUNTER — Encounter: Payer: Self-pay | Admitting: Cardiology

## 2013-01-11 ENCOUNTER — Ambulatory Visit (INDEPENDENT_AMBULATORY_CARE_PROVIDER_SITE_OTHER): Payer: Medicare Other | Admitting: Internal Medicine

## 2013-01-11 ENCOUNTER — Encounter: Payer: Self-pay | Admitting: Internal Medicine

## 2013-01-11 VITALS — BP 128/64 | HR 98 | Ht 68.0 in | Wt 164.0 lb

## 2013-01-11 DIAGNOSIS — J9610 Chronic respiratory failure, unspecified whether with hypoxia or hypercapnia: Secondary | ICD-10-CM

## 2013-01-11 DIAGNOSIS — I4892 Unspecified atrial flutter: Secondary | ICD-10-CM

## 2013-01-11 DIAGNOSIS — N401 Enlarged prostate with lower urinary tract symptoms: Secondary | ICD-10-CM

## 2013-01-11 DIAGNOSIS — J9611 Chronic respiratory failure with hypoxia: Secondary | ICD-10-CM

## 2013-01-11 DIAGNOSIS — R0902 Hypoxemia: Secondary | ICD-10-CM

## 2013-01-11 DIAGNOSIS — R339 Retention of urine, unspecified: Secondary | ICD-10-CM

## 2013-01-11 NOTE — Patient Instructions (Addendum)
Your physician recommends that you schedule a follow-up appointment in: AS NEEDED BASIS  Your physician has recommended you make the following change in your medication:   1) STOP XARELTO 2) START ASPIRIN 325MG , ONE TABLET DAILY

## 2013-01-11 NOTE — Assessment & Plan Note (Signed)
At this point, the patient is a satisfactory surgical risk for prostate surgery.

## 2013-01-11 NOTE — Assessment & Plan Note (Signed)
The patient is maintaining sinus rhythm very nicely after ablation. I've recommended that he discontinue his systemic anticoagulation and start aspirin 325 mg daily.

## 2013-01-11 NOTE — Progress Notes (Signed)
HPI Mr. Swigart returns today for followup. He is a very pleasant 79 year old man with a history of atrial flutter, who underwent catheter ablation several weeks ago. Initially he was somewhat bradycardic after his ablation, but his heart rate is improved. His energy level has picked up after ablation although he still has chronic dyspnea. He has long-standing COPD. He denies chest pain or peripheral edema. No syncope. He is pending prostate surgery. No Known Allergies   Current Outpatient Prescriptions  Medication Sig Dispense Refill  . albuterol (VENTOLIN HFA) 108 (90 BASE) MCG/ACT inhaler Inhale 2 puffs into the lungs every 6 (six) hours as needed. Shortness of breath      . cyanocobalamin 1000 MCG tablet Take 1,000 mcg by mouth daily.      . Fluticasone-Salmeterol (ADVAIR) 250-50 MCG/DOSE AEPB Inhale 1 puff into the lungs every 12 (twelve) hours.        . furosemide (LASIX) 40 MG tablet Take 20 mg by mouth daily.      . insulin aspart (NOVOLOG) 100 UNIT/ML injection Inject 5 Units into the skin 3 (three) times daily with meals.  1 vial  1  . insulin glargine (LANTUS) 100 UNIT/ML injection Inject 10 Units into the skin 2 (two) times daily.  10 mL    . ipratropium-albuterol (DUONEB) 0.5-2.5 (3) MG/3ML SOLN Take 3 mLs by nebulization 4 (four) times daily.      . iron polysaccharides (NIFEREX) 150 MG capsule Take 150 mg by mouth daily. Iron supplementation for your anemia.      Marland Kitchen lisinopril (PRINIVIL,ZESTRIL) 10 MG tablet Take 1 tablet (10 mg total) by mouth daily.  90 tablet  3  . mometasone (NASONEX) 50 MCG/ACT nasal spray Place 2 sprays into the nose daily.      . Rivaroxaban (XARELTO) 15 MG TABS tablet Take 1 tablet (15 mg total) by mouth at bedtime.  30 tablet  3  . roflumilast (DALIRESP) 500 MCG TABS tablet Take 500 mcg by mouth daily.      . simvastatin (ZOCOR) 40 MG tablet Take 40 mg by mouth at bedtime.      . Tamsulosin HCl (FLOMAX) 0.4 MG CAPS Take 0.8 mg by mouth daily after  supper.      . tiotropium (SPIRIVA) 18 MCG inhalation capsule Place 18 mcg into inhaler and inhale daily.       No current facility-administered medications for this visit.     Past Medical History  Diagnosis Date  . Arteriosclerotic cardiovascular disease (ASCVD)     Myocardial infarction in 1980; chronic dyspnea on exertion; H/o CHF;  04/2012-normal EF by echo  . Hyperlipidemia   . Hypertension   . Diabetes mellitus, type II   . Degenerative joint disease   . COPD (chronic obstructive pulmonary disease)     Home oxygen; pulmonary nodules  . Colon polyps 08/2011    Colonoscopy, Dr. Karilyn Cota.  Marland Kitchen BPH (benign prostatic hyperplasia)     Urinary retention-10/2011; right renal mass  . Iron deficiency anemia 11/14/2011  . Atrial flutter with rapid ventricular response 04/29/2012    s/p RFCA 12/04/12  . Lung cancer     ROS:   All systems reviewed and negative except as noted in the HPI.   Past Surgical History  Procedure Laterality Date  . Soft tissue cyst excision      Face  . Colonoscopy  08/28/2011    Procedure: COLONOSCOPY;  Surgeon: Malissa Hippo, MD;  Location: [facility];  Service: Endoscopy;  Laterality: N/A;  .  Radiofrequency ablation  11/2012    Atrial flutter     Family History  Problem Relation Age of Onset  . Cancer Mother   . Cancer Sister   . Cancer Brother      History   Social History  . Marital Status: Married    Spouse Name: N/A    Number of Children: N/A  . Years of Education: N/A   Occupational History  . Not on file.   Social History Main Topics  . Smoking status: Former Smoker -- 1.50 packs/day for 60 years    Types: Cigarettes    Quit date: 08/06/2008  . Smokeless tobacco: Never Used  . Alcohol Use: No     Comment: occasional   . Drug Use: No  . Sexually Active: No   Other Topics Concern  . Not on file   Social History Narrative  . No narrative on file     BP 128/64  Pulse 98  Ht 5\' 8"  (1.727 m)  Wt 164 lb (74.39 kg)   BMI 24.94 kg/m2  Physical Exam:  Well appearing 79 year old man,NAD HEENT: Unremarkable Neck:  7 cm JVD, no thyromegally Lungs:  Clearwith no wheezes, rales, or rhonchi. Oxygen saturation, 94%, on room air. HEART:  Regular rate rhythm, no murmurs, no rubs, no clicks Abd:  soft, positive bowel sounds, no organomegally, no rebound, no guarding Ext:  2 plus pulses, no edema, no cyanosis, no clubbing Skin:  No rashes no nodules Neuro:  CN II through XII intact, motor grossly intact  EKGsinus rhythm with first degree AV block   Assess/Plan:

## 2013-01-11 NOTE — Assessment & Plan Note (Signed)
He still has chronic dyspnea with exertion. I've encouraged the patient to increase his physical activity.

## 2013-01-15 ENCOUNTER — Emergency Department (HOSPITAL_COMMUNITY)
Admission: EM | Admit: 2013-01-15 | Discharge: 2013-01-16 | Disposition: A | Discharge status: Home / Self Care | Payer: Medicare Other | Attending: Emergency Medicine | Admitting: Emergency Medicine

## 2013-01-15 ENCOUNTER — Emergency Department (HOSPITAL_COMMUNITY): Payer: Medicare Other

## 2013-01-15 ENCOUNTER — Encounter (HOSPITAL_COMMUNITY): Payer: Self-pay | Admitting: *Deleted

## 2013-01-15 DIAGNOSIS — Z87891 Personal history of nicotine dependence: Secondary | ICD-10-CM | POA: Insufficient documentation

## 2013-01-15 DIAGNOSIS — R0609 Other forms of dyspnea: Secondary | ICD-10-CM | POA: Insufficient documentation

## 2013-01-15 DIAGNOSIS — IMO0002 Reserved for concepts with insufficient information to code with codable children: Secondary | ICD-10-CM | POA: Insufficient documentation

## 2013-01-15 DIAGNOSIS — Z860100 Personal history of colon polyps, unspecified: Secondary | ICD-10-CM | POA: Insufficient documentation

## 2013-01-15 DIAGNOSIS — R0989 Other specified symptoms and signs involving the circulatory and respiratory systems: Secondary | ICD-10-CM | POA: Insufficient documentation

## 2013-01-15 DIAGNOSIS — Z8739 Personal history of other diseases of the musculoskeletal system and connective tissue: Secondary | ICD-10-CM | POA: Insufficient documentation

## 2013-01-15 DIAGNOSIS — Z79899 Other long term (current) drug therapy: Secondary | ICD-10-CM | POA: Insufficient documentation

## 2013-01-15 DIAGNOSIS — R062 Wheezing: Secondary | ICD-10-CM | POA: Insufficient documentation

## 2013-01-15 DIAGNOSIS — I10 Essential (primary) hypertension: Secondary | ICD-10-CM | POA: Insufficient documentation

## 2013-01-15 DIAGNOSIS — Y846 Urinary catheterization as the cause of abnormal reaction of the patient, or of later complication, without mention of misadventure at the time of the procedure: Secondary | ICD-10-CM | POA: Insufficient documentation

## 2013-01-15 DIAGNOSIS — J449 Chronic obstructive pulmonary disease, unspecified: Secondary | ICD-10-CM | POA: Insufficient documentation

## 2013-01-15 DIAGNOSIS — E119 Type 2 diabetes mellitus without complications: Secondary | ICD-10-CM | POA: Insufficient documentation

## 2013-01-15 DIAGNOSIS — T83091A Other mechanical complication of indwelling urethral catheter, initial encounter: Secondary | ICD-10-CM | POA: Insufficient documentation

## 2013-01-15 DIAGNOSIS — N390 Urinary tract infection, site not specified: Secondary | ICD-10-CM | POA: Insufficient documentation

## 2013-01-15 DIAGNOSIS — Z862 Personal history of diseases of the blood and blood-forming organs and certain disorders involving the immune mechanism: Secondary | ICD-10-CM | POA: Insufficient documentation

## 2013-01-15 DIAGNOSIS — E785 Hyperlipidemia, unspecified: Secondary | ICD-10-CM | POA: Insufficient documentation

## 2013-01-15 DIAGNOSIS — Z7982 Long term (current) use of aspirin: Secondary | ICD-10-CM | POA: Insufficient documentation

## 2013-01-15 DIAGNOSIS — Z87448 Personal history of other diseases of urinary system: Secondary | ICD-10-CM | POA: Insufficient documentation

## 2013-01-15 DIAGNOSIS — J4489 Other specified chronic obstructive pulmonary disease: Secondary | ICD-10-CM | POA: Insufficient documentation

## 2013-01-15 DIAGNOSIS — Z8679 Personal history of other diseases of the circulatory system: Secondary | ICD-10-CM | POA: Insufficient documentation

## 2013-01-15 DIAGNOSIS — Z794 Long term (current) use of insulin: Secondary | ICD-10-CM | POA: Insufficient documentation

## 2013-01-15 DIAGNOSIS — Z85118 Personal history of other malignant neoplasm of bronchus and lung: Secondary | ICD-10-CM | POA: Insufficient documentation

## 2013-01-15 LAB — URINALYSIS, ROUTINE W REFLEX MICROSCOPIC
Bilirubin Urine: NEGATIVE
Nitrite: NEGATIVE
Specific Gravity, Urine: 1.025 (ref 1.005–1.030)
Urobilinogen, UA: 0.2 mg/dL (ref 0.0–1.0)
pH: 6 (ref 5.0–8.0)

## 2013-01-15 LAB — CBC WITH DIFFERENTIAL/PLATELET
Hemoglobin: 10.7 g/dL — ABNORMAL LOW (ref 13.0–17.0)
Lymphocytes Relative: 17 % (ref 12–46)
Lymphs Abs: 1.3 10*3/uL (ref 0.7–4.0)
MCH: 24.3 pg — ABNORMAL LOW (ref 26.0–34.0)
MCV: 82 fL (ref 78.0–100.0)
Monocytes Relative: 10 % (ref 3–12)
Neutrophils Relative %: 71 % (ref 43–77)
Platelets: 256 10*3/uL (ref 150–400)
RBC: 4.4 MIL/uL (ref 4.22–5.81)
WBC: 7.9 10*3/uL (ref 4.0–10.5)

## 2013-01-15 LAB — BASIC METABOLIC PANEL
BUN: 20 mg/dL (ref 6–23)
CO2: 27 mEq/L (ref 19–32)
Glucose, Bld: 178 mg/dL — ABNORMAL HIGH (ref 70–99)
Potassium: 4.1 mEq/L (ref 3.5–5.1)
Sodium: 139 mEq/L (ref 135–145)

## 2013-01-15 LAB — URINE MICROSCOPIC-ADD ON

## 2013-01-15 MED ORDER — PREDNISONE 50 MG PO TABS
60.0000 mg | ORAL_TABLET | Freq: Once | ORAL | Status: AC
  Administered 2013-01-15: 60 mg via ORAL
  Filled 2013-01-15: qty 1

## 2013-01-15 MED ORDER — PREDNISONE 20 MG PO TABS: 60.0000 mg | ORAL_TABLET | Freq: Every day | ORAL | Status: DC

## 2013-01-15 MED ORDER — ALBUTEROL SULFATE (5 MG/ML) 0.5% IN NEBU
2.5000 mg | INHALATION_SOLUTION | Freq: Once | RESPIRATORY_TRACT | Status: AC
  Administered 2013-01-15: 2.5 mg via RESPIRATORY_TRACT
  Filled 2013-01-15: qty 0.5

## 2013-01-15 MED ORDER — IPRATROPIUM BROMIDE 0.02 % IN SOLN
0.5000 mg | Freq: Once | RESPIRATORY_TRACT | Status: AC
  Administered 2013-01-15: 0.5 mg via RESPIRATORY_TRACT
  Filled 2013-01-15: qty 2.5

## 2013-01-15 MED ORDER — DOXYCYCLINE HYCLATE 100 MG PO CAPS: 100.0000 mg | ORAL_CAPSULE | Freq: Two times a day (BID) | ORAL | Status: DC

## 2013-01-15 MED ORDER — CEPHALEXIN 500 MG PO CAPS: 500.0000 mg | ORAL_CAPSULE | Freq: Three times a day (TID) | ORAL | Status: DC

## 2013-01-15 MED ORDER — CEPHALEXIN 500 MG PO CAPS
500.0000 mg | ORAL_CAPSULE | Freq: Once | ORAL | Status: AC
  Administered 2013-01-15: 500 mg via ORAL
  Filled 2013-01-15: qty 1

## 2013-01-15 NOTE — ED Notes (Signed)
D/c'd current foley catheter. Tolerated well

## 2013-01-15 NOTE — ED Notes (Signed)
resp and radiology in room, will replace foley when done with nebulizer.

## 2013-01-15 NOTE — ED Notes (Addendum)
Pt states shortness of breath increasing over past several days. Pt is in 2 L o2, moderate wheezing noted and pt is dyspnic with exertion. Pt also states that his foley has been leaking around his penis. Per dr Preston Fleeting we will d/c current foley and insert a new one. Pt is aax4, ST at 112 on monitor and o2 sats 97% on 2l o2. Dr zammit in room during assessment and states no need for EKG. Pt placed on cardiac monitor and 02 sats monitored.

## 2013-01-15 NOTE — ED Provider Notes (Signed)
History    This chart was scribed for [name], MD by Gerlean Ren, ED Scribe. This patient was seen in room [facility]14/[facility]14 and the patient's care was started at 9:33 PM    CSN: [id number]  Arrival date & time 01/15/13  2111   First MD Initiated Contact with Patient 01/15/13 2130      Chief Complaint  Patient presents with  . Shortness of Breath     The history is provided by the patient. No language interpreter was used.  Brendan Hall is a 79 y.o. male with h/o ASCVD, HTN, DM-II, COPD, and lung cancer who presents to the Emergency Department complaining that his foley catheter is leaking and mildly painful.  Pt also complains of dyspnea with gradual onset all day today.  Pt denies abdominal pain.   Pt takes 2L oxygen at home.  Past Medical History  Diagnosis Date  . Arteriosclerotic cardiovascular disease (ASCVD)     Myocardial infarction in 1980; chronic dyspnea on exertion; H/o CHF;  04/2012-normal EF by echo  . Hyperlipidemia   . Hypertension   . Diabetes mellitus, type II   . Degenerative joint disease   . COPD (chronic obstructive pulmonary disease)     Home oxygen; pulmonary nodules  . Colon polyps 08/2011    Colonoscopy, Dr. Karilyn Cota.  Marland Kitchen BPH (benign prostatic hyperplasia)     Urinary retention-10/2011; right renal mass  . Iron deficiency anemia 11/14/2011  . Atrial flutter with rapid ventricular response 04/29/2012    s/p RFCA 12/04/12  . Lung cancer     Past Surgical History  Procedure Laterality Date  . Soft tissue cyst excision      Face  . Colonoscopy  08/28/2011    Procedure: COLONOSCOPY;  Surgeon: Malissa Hippo, MD;  Location: [facility];  Service: Endoscopy;  Laterality: N/A;  . Radiofrequency ablation  11/2012    Atrial flutter    Family History  Problem Relation Age of Onset  . Cancer Mother   . Cancer Sister   . Cancer Brother     History  Substance Use Topics  . Smoking status: Former Smoker -- 1.50 packs/day for 60 years    Types:  Cigarettes    Quit date: 08/06/2008  . Smokeless tobacco: Never Used  . Alcohol Use: No     Comment: occasional       Review of Systems  Constitutional: Negative for fatigue.  HENT: Negative for congestion, sinus pressure and ear discharge.   Eyes: Negative for discharge.  Respiratory: Positive for shortness of breath and wheezing. Negative for cough.   Cardiovascular: Negative for chest pain.  Gastrointestinal: Negative for abdominal pain and diarrhea.  Genitourinary: Negative for frequency and hematuria.       Foley catheter leaking  Musculoskeletal: Negative for back pain.  Skin: Negative for rash.  Neurological: Negative for seizures and headaches.  Psychiatric/Behavioral: Negative for hallucinations.    Allergies  Review of patient's allergies indicates no known allergies.  Home Medications   Current Outpatient Rx  Name  Route  Sig  Dispense  Refill  . albuterol (VENTOLIN HFA) 108 (90 BASE) MCG/ACT inhaler   Inhalation   Inhale 2 puffs into the lungs every 6 (six) hours as needed. Shortness of breath         . aspirin 325 MG tablet   Oral   Take 325 mg by mouth daily.         . cyanocobalamin 1000 MCG tablet   Oral  Take 1,000 mcg by mouth daily.         . Fluticasone-Salmeterol (ADVAIR) 250-50 MCG/DOSE AEPB   Inhalation   Inhale 1 puff into the lungs every 12 (twelve) hours.           . furosemide (LASIX) 40 MG tablet   Oral   Take 20 mg by mouth daily.         . insulin aspart (NOVOLOG) 100 UNIT/ML injection   Subcutaneous   Inject 5 Units into the skin 3 (three) times daily with meals.   1 vial   1   . insulin glargine (LANTUS) 100 UNIT/ML injection   Subcutaneous   Inject 10 Units into the skin 2 (two) times daily.   10 mL      . ipratropium-albuterol (DUONEB) 0.5-2.5 (3) MG/3ML SOLN   Nebulization   Take 3 mLs by nebulization 4 (four) times daily.         . iron polysaccharides (NIFEREX) 150 MG capsule   Oral   Take 150 mg by  mouth daily. Iron supplementation for your anemia.         Marland Kitchen lisinopril (PRINIVIL,ZESTRIL) 10 MG tablet   Oral   Take 1 tablet (10 mg total) by mouth daily.   90 tablet   3   . mometasone (NASONEX) 50 MCG/ACT nasal spray   Nasal   Place 2 sprays into the nose daily.         . roflumilast (DALIRESP) 500 MCG TABS tablet   Oral   Take 500 mcg by mouth daily.         . simvastatin (ZOCOR) 40 MG tablet   Oral   Take 40 mg by mouth at bedtime.         . Tamsulosin HCl (FLOMAX) 0.4 MG CAPS   Oral   Take 0.8 mg by mouth daily after supper.         . tiotropium (SPIRIVA) 18 MCG inhalation capsule   Inhalation   Place 18 mcg into inhaler and inhale daily.           BP 134/80  Pulse 107  Temp(Src) 98.2 F (36.8 C) (Oral)  Ht 5\' 8"  (1.727 m)  Wt 150 lb (68.04 kg)  BMI 22.81 kg/m2  SpO2 96%  Physical Exam  Nursing note and vitals reviewed. Constitutional: He is oriented to person, place, and time. He appears well-developed.  HENT:  Head: Normocephalic and atraumatic.  Eyes: Conjunctivae and EOM are normal. No scleral icterus.  Neck: Neck supple. No thyromegaly present.  Cardiovascular: Normal rate and regular rhythm.  Exam reveals no gallop and no friction rub.   No murmur heard. Pulmonary/Chest: Effort normal. No stridor. He has wheezes. He has no rales. He exhibits no tenderness.  Mild wheezing bilaterally.  Abdominal: He exhibits no distension. There is no tenderness. There is no rebound.  Musculoskeletal: Normal range of motion. He exhibits no edema.  Lymphadenopathy:    He has no cervical adenopathy.  Neurological: He is oriented to person, place, and time. Coordination normal.  Skin: No rash noted. No erythema.  Psychiatric: He has a normal mood and affect. His behavior is normal.    ED Course  Procedures (including critical care time) DIAGNOSTIC STUDIES: Oxygen Saturation is 96% on [place], adequate by my interpretation.    COORDINATION OF CARE: 9:35  PM- Patient informed of clinical course, understands medical decision-making process, and agrees with plan.  Labs Reviewed - No data to display No results found.  No diagnosis found.    MDM   The chart was scribed for me under my direct supervision.  I personally performed the history, physical, and medical decision making and all procedures in the evaluation of this patient.[name], MD 01/15/13 [phone]

## 2013-01-15 NOTE — ED Notes (Signed)
Sob all day, also catheter hurting him.  No chest pain

## 2013-01-15 NOTE — ED Notes (Signed)
Pt states relief of breathing at this time after breathing treatment, less wheezing noted and now only a mild exp wheeze is noted. Pt has new foley in place, will be sent home with foley and leg back per MD orders.

## 2013-01-16 NOTE — ED Notes (Signed)
Pt states that his Catheter is still leaking around the penis. Informed him that an 31 french is the largest we have in the ED. Pt previously had an 18 french that was removed. Pt upset that we could not fix the issue. Dr Rulon Abide made aware and states that he will need to follow up with his urologist next week and in the mean time keep the area as clean and dry as possible. Pt states understanding. Educated pt on prescribed antibiotics.

## 2013-01-17 LAB — URINE CULTURE

## 2013-01-18 ENCOUNTER — Telehealth (HOSPITAL_COMMUNITY): Payer: Self-pay | Admitting: Emergency Medicine

## 2013-01-18 NOTE — ED Notes (Signed)
Positive URNC. [id number] Colonies of yeast. Chart sent to EDP office for review.

## 2013-01-20 ENCOUNTER — Telehealth (HOSPITAL_COMMUNITY): Payer: Self-pay | Admitting: Emergency Medicine

## 2013-01-20 NOTE — ED Notes (Signed)
Chart reviewed by Dr Patria Mane " PCP F/U ASAP or return to the ER"  Spoke to pt informed of dx and MD recommendation.  Pt would like result faxed to PCP Dr Colon Branch.  Office closed will call in am to get fax #

## 2013-03-01 IMAGING — CR DG CHEST 2V
2 series · 2 of 2 positions shown · non-contrast
Comparison: 04/29/2012

CLINICAL DATA: Atrial fibrillation, COPD, anemia, history diabetes,
hypertension, coronary disease post MI, lung cancer

CHEST - 2 VIEW

[view not recorded (1 of 2)]
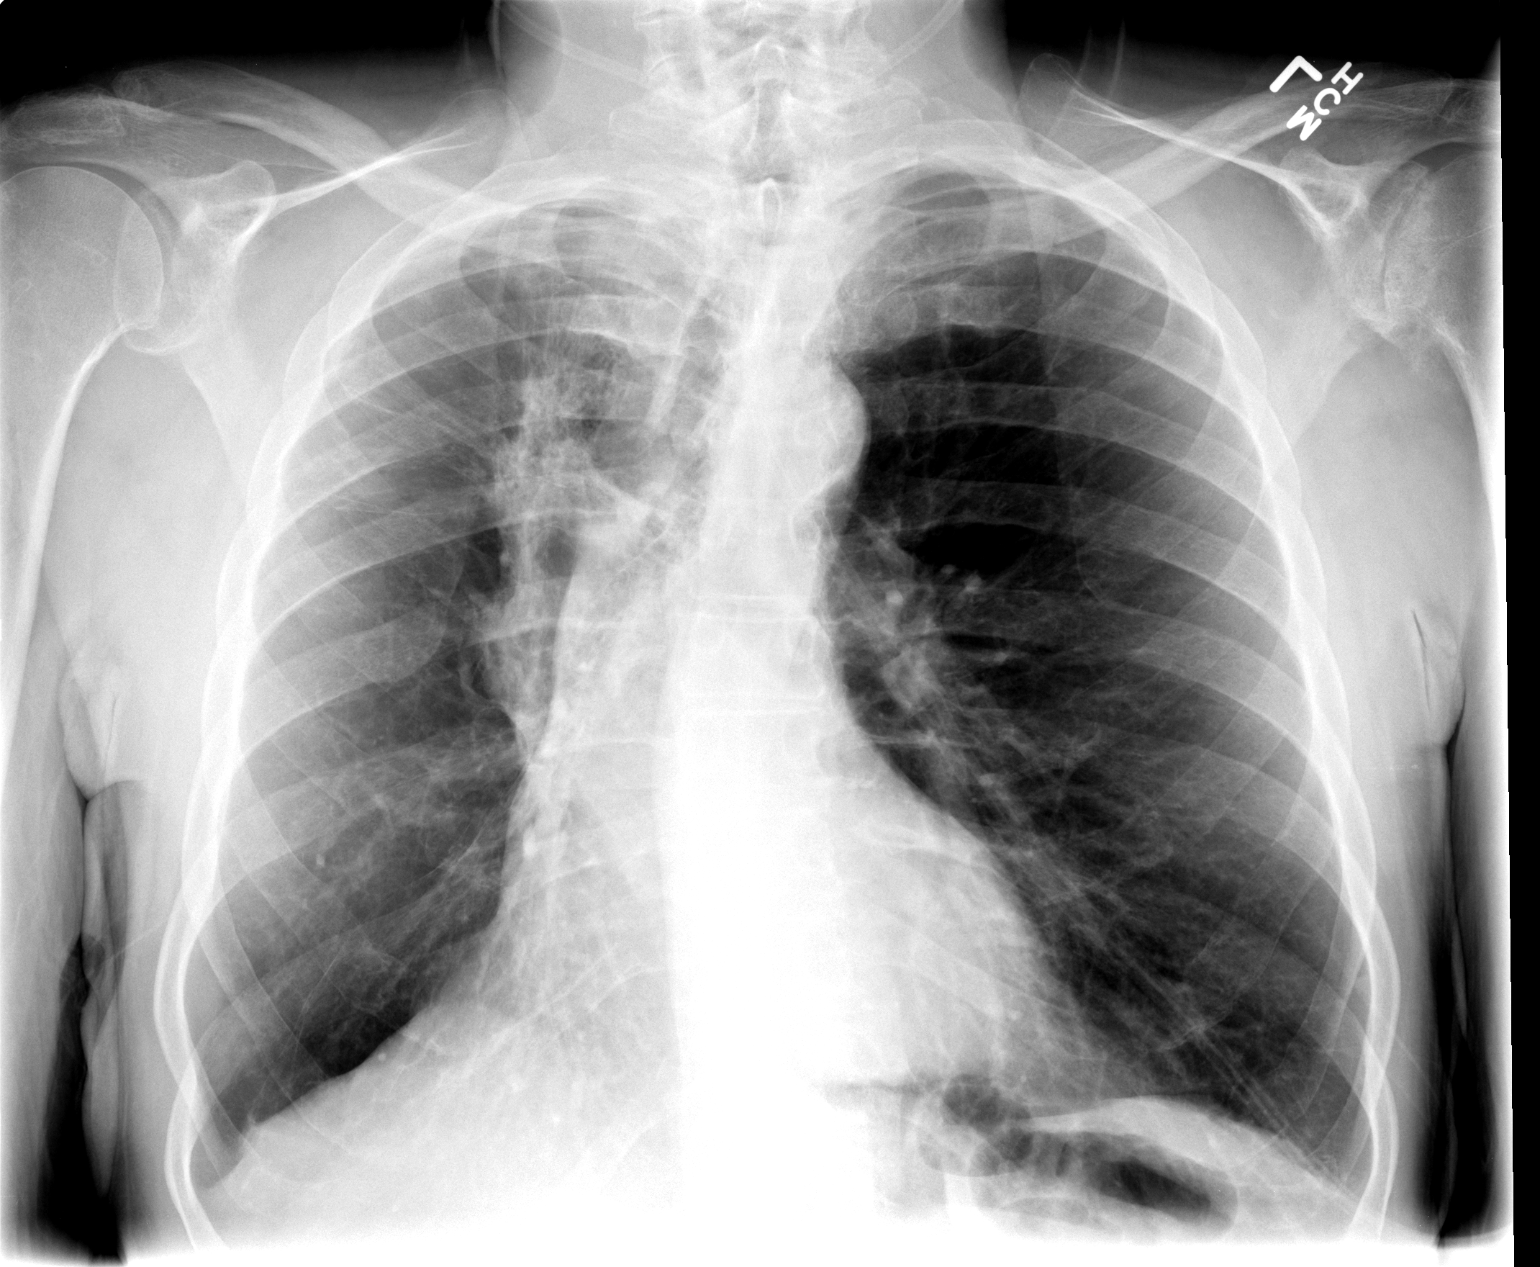

[view not recorded (2 of 2)]
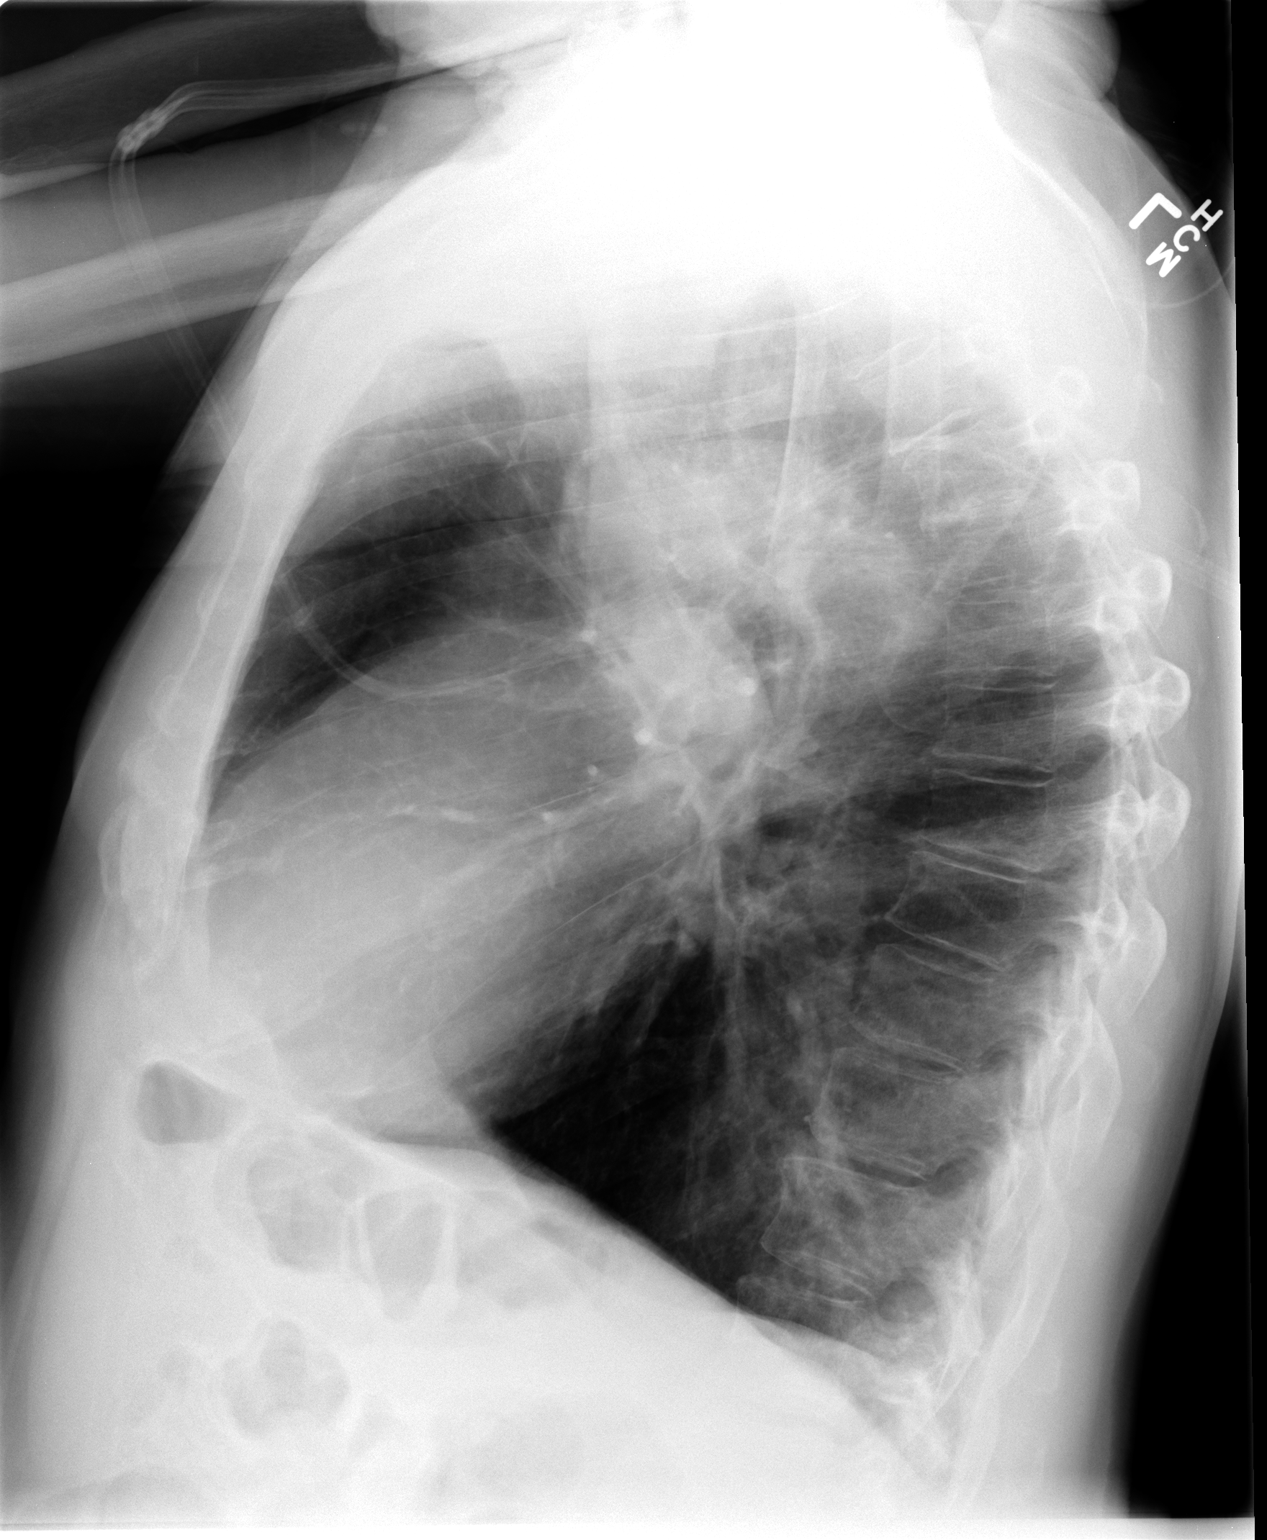

[2 of 2 positions shown; findings below may reference images not displayed]

FINDINGS: Upper normal heart size.
Volume loss right hemithorax with mediastinal shift to the right.
Atherosclerotic calcification aorta.
Right upper lobe density appears slightly more prominent than on
the previous study, question progressive scarring versus recurrent
tumor.
Underlying emphysematous changes.
Remaining lungs clear.
No pleural effusion or pneumothorax.
Advanced left and humeral degenerative changes.
IMPRESSION: Changes of COPD with right upper lobe density which could represent
scarring secondary to lung cancer and prior therapy though this
appears slightly more prominent on the previous study; cannot
exclude recurrent tumor.
Recommend CT chest with contrast to evaluate.

## 2013-03-09 IMAGING — CT CT CHEST W/O CM
2 of 3 series · 15 of 36 positions shown, 18 images · non-contrast
Comparison: Chest radiograph 05/19/2012.

CLINICAL DATA: Irregular heart beat.  History lung cancer.

CT CHEST WITHOUT CONTRAST
TECHNIQUE: Multidetector CT imaging of the chest was performed
following the standard protocol without IV contrast.

[Series 2: chestroutine 5.0 b40f · axial · 0.66mm/px · z∈[-310,-36]mm · 12 of 65 slices shown, 15 images]
[im 5/65  mediastinal]
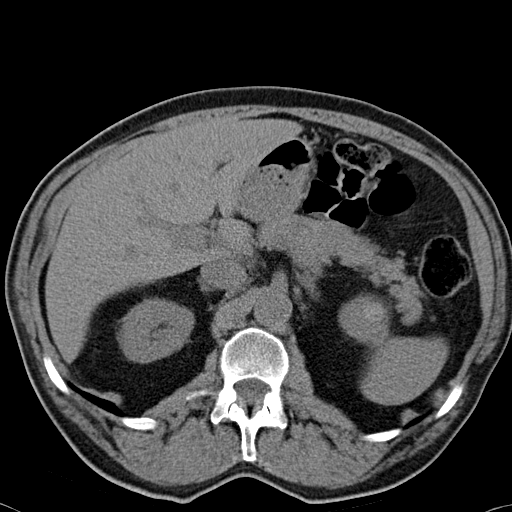
[im 5/65  lung]
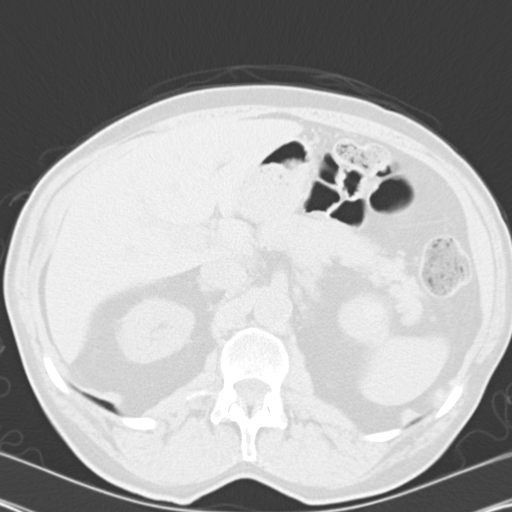
[im 10/65  lung]
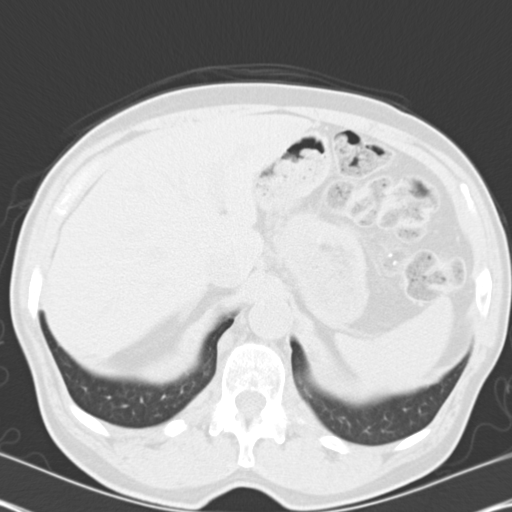
[im 15/65  lung]
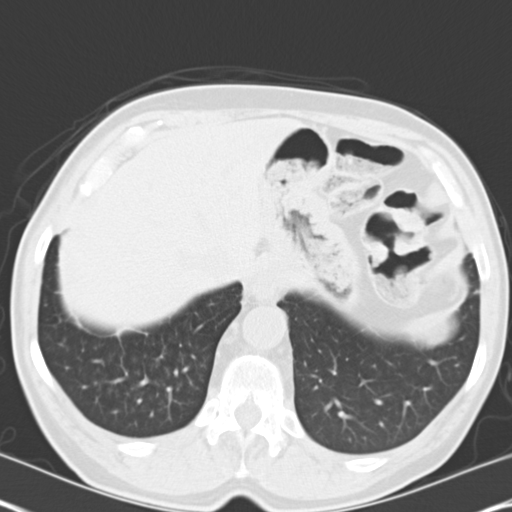
[im 19/65  lung]
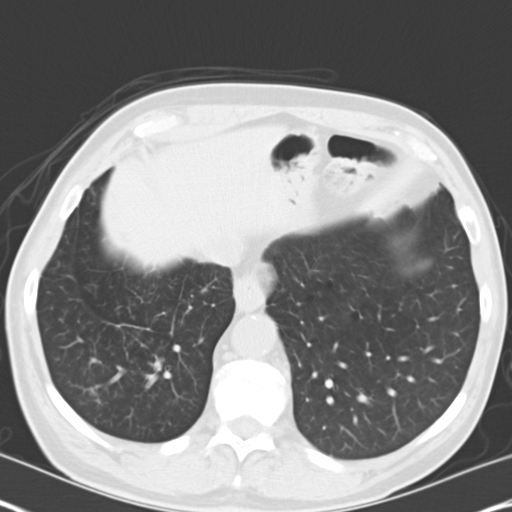
[im 24/65  mediastinal]
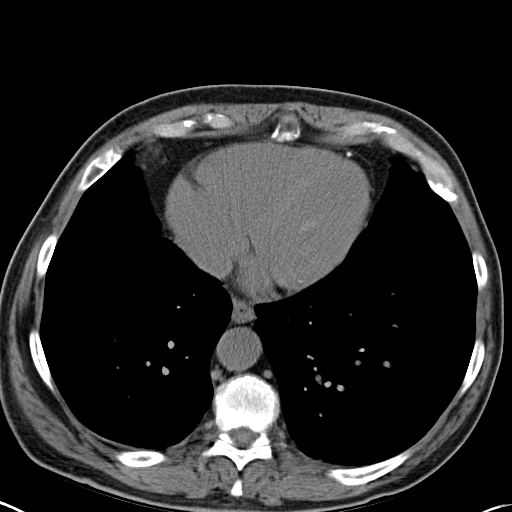
[im 24/65  lung]
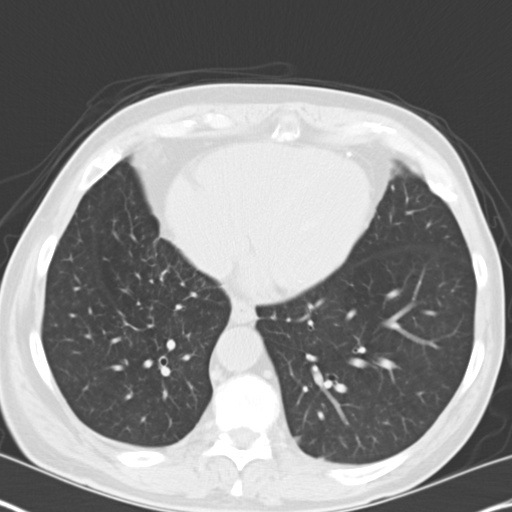
[im 29/65  lung]
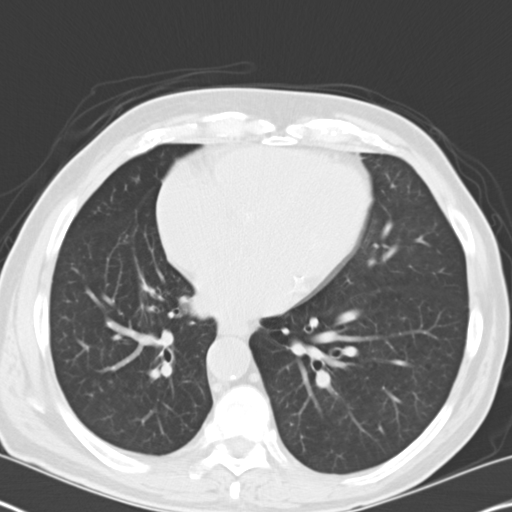
[im 36/65  lung]
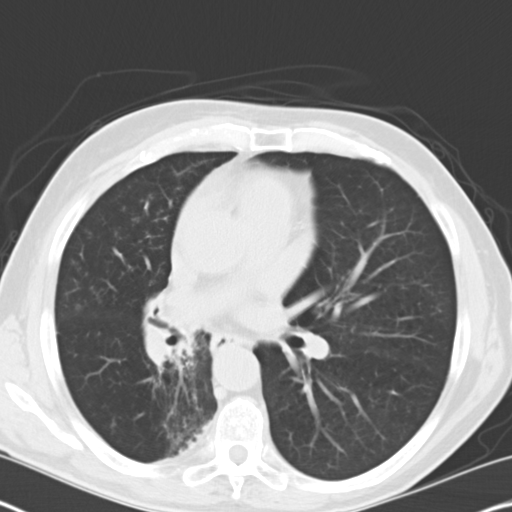
[im 41/65  lung]
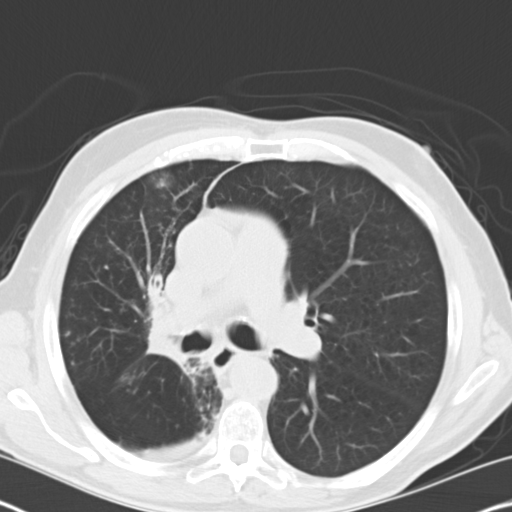
[im 46/65  mediastinal]
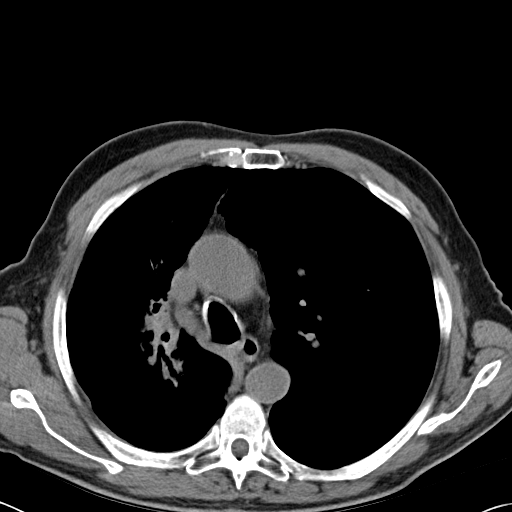
[im 46/65  lung]
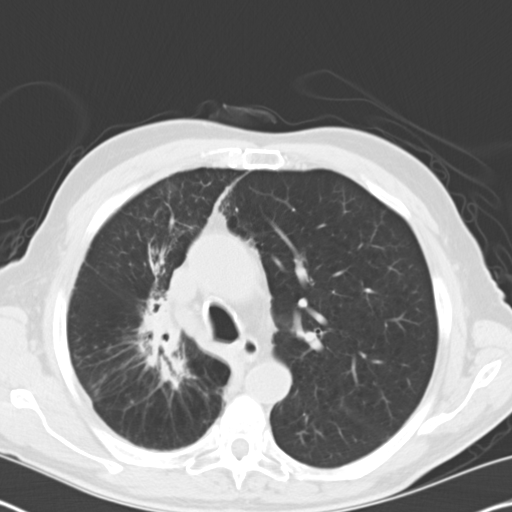
[im 50/65  lung]
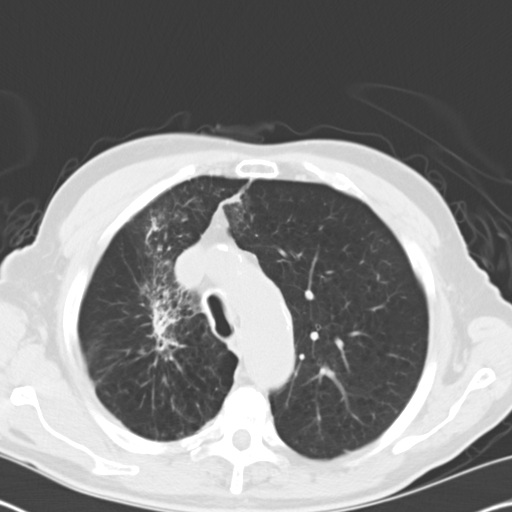
[im 55/65  lung]
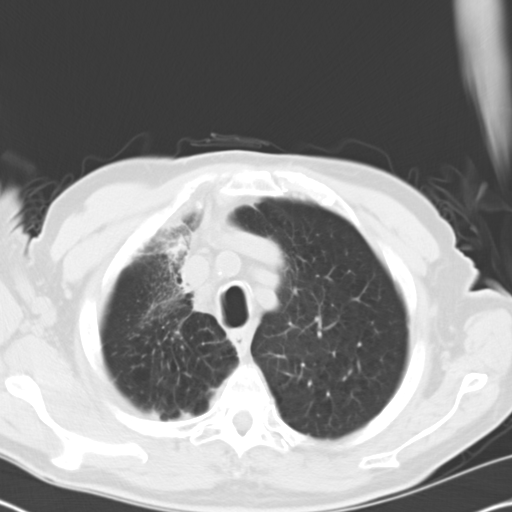
[im 60/65  lung]
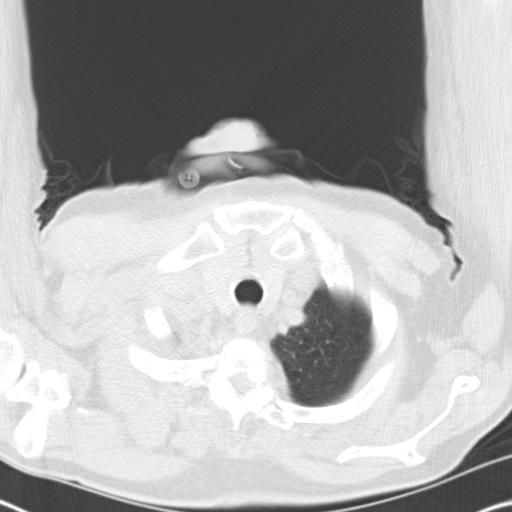

[Series 4: mpr coro 3mm · coronal · 0.62mm/px · 3 of 92 slices shown]
[im 19/92  lung]
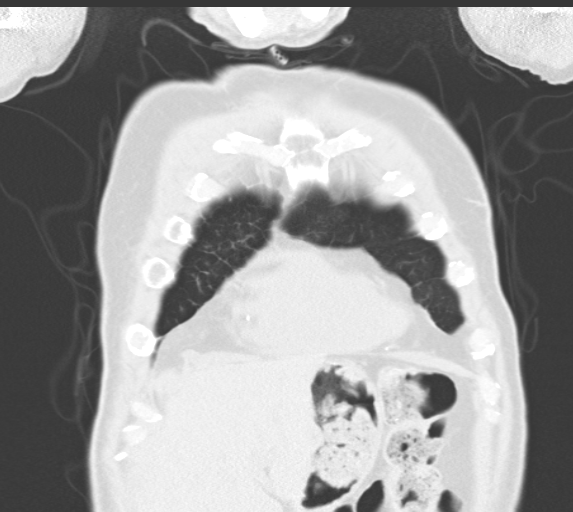
[im 37/92  lung]
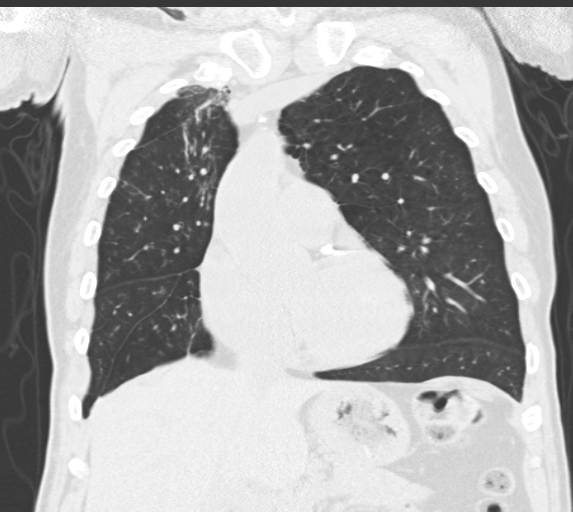
[im 55/92  lung]
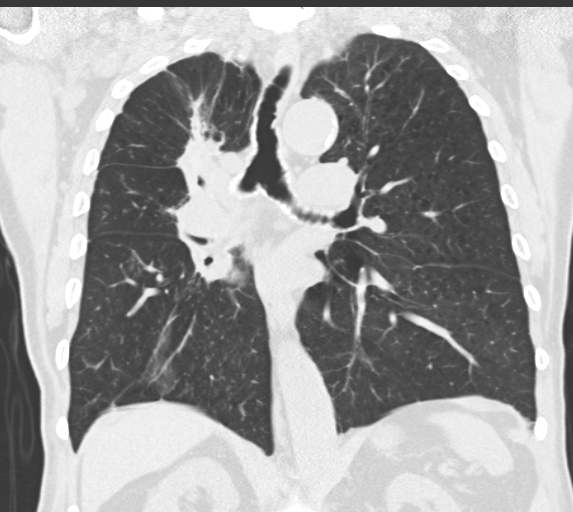

[15 of 36 positions shown; findings below may reference images not displayed]

FINDINGS: No pathologically enlarged mediastinal or axillary lymph
nodes.  Hilar regions are difficult to definitively evaluate
without IV contrast.  Pulmonary arteries are enlarged.  Extensive
coronary artery calcification.  Heart size normal.  No pericardial
effusion.  Small lymph nodes are seen along the course of the
descending thoracic aorta and esophagus.  Esophagus is mildly
dilated.

Emphysema.  Confluent soft tissue is seen in the right suprahilar
region, as well as right hilum, with surrounding volume loss,
bronchiectasis and architectural distortion.  Peribronchovascular
nodularity is seen throughout the right lung, with debris in the
bronchus intermedius.  A few scattered pulmonary nodules measure up
to 6 mm in the left lower lobe (image 43).  No pleural fluid.
Airway is otherwise unremarkable.

Incidental imaging of the upper abdomen shows scattered
hyperattenuating lesions in the kidneys, incompletely imaged.  No
worrisome lytic or sclerotic lesions.
IMPRESSION: 1.  Confluent soft tissue in the right suprahilar region and right
hilum, with surrounding bronchiectasis and architectural
distortion.  While these findings may be due to radiation therapy
in this patient with a history of lung cancer, recurrent disease
cannot be excluded.  Comparison with prior exams would be helpful,
if available.
2.  Peribronchovascular nodularity throughout the right lung, with
debris in the bronchus intermedius.  Findings favor an acute
infectious bronchiolitis.
3.  Pulmonary arterial hypertension and extensive coronary artery
calcification.
4.  Scattered pulmonary nodules measure up to 6 mm. If the patient
is at high risk for bronchogenic carcinoma, follow-up chest CT at 6-
12 months is recommended.  If the patient is at low risk for
bronchogenic carcinoma, follow-up chest CT at 12 months is
recommended.  This recommendation follows the consensus statement:
Guidelines for Management of Small Pulmonary Nodules Detected on CT
Scans: A Statement from the [HOSPITAL] as published in

## 2013-03-31 ENCOUNTER — Ambulatory Visit (INDEPENDENT_AMBULATORY_CARE_PROVIDER_SITE_OTHER): Payer: Medicare Other | Admitting: Cardiology

## 2013-03-31 ENCOUNTER — Encounter: Payer: Self-pay | Admitting: Cardiology

## 2013-03-31 ENCOUNTER — Ambulatory Visit: Payer: Medicare Other | Admitting: Cardiology

## 2013-03-31 VITALS — BP 110/50 | HR 102 | Ht 68.0 in | Wt 159.0 lb

## 2013-03-31 DIAGNOSIS — D500 Iron deficiency anemia secondary to blood loss (chronic): Secondary | ICD-10-CM

## 2013-03-31 DIAGNOSIS — N401 Enlarged prostate with lower urinary tract symptoms: Secondary | ICD-10-CM

## 2013-03-31 DIAGNOSIS — I10 Essential (primary) hypertension: Secondary | ICD-10-CM

## 2013-03-31 DIAGNOSIS — R339 Retention of urine, unspecified: Secondary | ICD-10-CM

## 2013-03-31 DIAGNOSIS — I4892 Unspecified atrial flutter: Secondary | ICD-10-CM

## 2013-03-31 NOTE — Progress Notes (Signed)
Patient ID: Brendan Hall, male   DOB: 1930/09/09, 79 y.o.   MRN: [id number]  HPI: Schedule return visit for this nice gentleman who previously underwent radiofrequency ablation for atrial flutter. He has subsequently done well from a cardiac standpoint.  There is a remote history of coronary artery disease with a reported myocardial infarction in 1980 and normal LV systolic function on echo in 04/2012. No description of coronary anatomy as available. He has not had any cardiac symptoms since undergoing radiofrequency ablation for atrial flutter approximately one year ago.  Anticoagulation was discontinued in February of this year.  He has chronic dyspnea on exertion attributed to lung disease. Control of hypertension has been good.  Current Outpatient Prescriptions  Medication Sig Dispense Refill  . cyanocobalamin 1000 MCG tablet Take 1,000 mcg by mouth every morning.       Marland Kitchen doxepin (SINEQUAN) 10 MG capsule Take 10 mg by mouth at bedtime.      . Fluticasone-Salmeterol (ADVAIR) 250-50 MCG/DOSE AEPB Inhale 1 puff into the lungs every 12 (twelve) hours.        Marland Kitchen ipratropium-albuterol (DUONEB) 0.5-2.5 (3) MG/3ML SOLN Take 3 mLs by nebulization 4 (four) times daily.      . iron polysaccharides (NIFEREX) 150 MG capsule Take 150 mg by mouth every morning. Iron supplementation for your anemia.      Marland Kitchen lisinopril (PRINIVIL,ZESTRIL) 10 MG tablet Take 10 mg by mouth daily.       . mometasone (NASONEX) 50 MCG/ACT nasal spray Place 2 sprays into the nose daily.      . roflumilast (DALIRESP) 500 MCG TABS tablet Take 500 mcg by mouth daily with supper.       . simvastatin (ZOCOR) 40 MG tablet Take 40 mg by mouth daily with supper.       . Tamsulosin HCl (FLOMAX) 0.4 MG CAPS Take 0.8 mg by mouth daily after supper.      . tiotropium (SPIRIVA) 18 MCG inhalation capsule Place 18 mcg into inhaler and inhale daily.      Marland Kitchen albuterol (VENTOLIN HFA) 108 (90 BASE) MCG/ACT inhaler Inhale 2 puffs into the lungs every 6  (six) hours as needed. Shortness of breath      . insulin glargine (LANTUS) 100 UNIT/ML injection Inject 10 Units into the skin every morning.       No current facility-administered medications for this visit.   No Known Allergies   Past medical history, social history, and family history reviewed and updated.  ROS: Denies chest pain, orthopnea, PND, lightheadedness or syncope.  All other systems reviewed and are negative.  PHYSICAL EXAM: BP 110/50  Pulse 102  Ht 5\' 8"  (1.727 m)  Wt 72.122 kg (159 lb)  BMI 24.18 kg/m2  SpO2 95%;  Body mass index is 24.18 kg/(m^2). General-Well developed; no acute distress Body habitus-proportionate weight and height Neck-No JVD; no carotid bruits Lungs-minor inspiratory and expiratory rhonchi; resonant to percussion; prolonged expiratory phase Cardiovascular-normal PMI; normal S1 and S2 Abdomen-normal bowel sounds; soft and non-tender without masses or organomegaly Musculoskeletal-No deformities, no cyanosis or clubbing Neurologic-Normal cranial nerves; symmetric strength and tone Skin-Warm, no significant lesions Extremities-1+ distal pulses; 1+ ankle edema  EKG: Sinus tachycardia at a rate of 107 bpm; profound first-degree AV block with PR of 300 ms; low-voltage; consider prior septal MI; no previous tracing for comparison.  [place] Bing, MD 03/31/2013  4:30 PM  ASSESSMENT AND PLAN

## 2013-03-31 NOTE — Assessment & Plan Note (Signed)
Urinary catheter remains in place, but removal is planned for the near future. TURP was apparently successful and well tolerated.

## 2013-03-31 NOTE — Patient Instructions (Addendum)
Your physician recommends that you schedule a follow-up appointment in: AS NEEDED  Your physician has recommended you make the following change in your medication:   1) DECREASE ASPIRIN TO 81MG  DAILY  Your physician recommends that you schedule a follow-up appointment in: NURSE VISIT THIS WEEK -BRING YOUR BOTTLES

## 2013-03-31 NOTE — Assessment & Plan Note (Addendum)
Unless patient becomes symptomatic or future EKGs demonstrate recurrent arrhythmia, routine cardiology care no longer appears to be necessary. Aspirin dose decreased to 81 mg per day. Patient uncertain of his medications, but we verified that he is no longer taking rivaroxaban.

## 2013-03-31 NOTE — Progress Notes (Deleted)
Name: DEMPSY Hall    DOB: July 13, 1930  Age: 79 y.o.  MR#: [id number]       PCP:  Colon Branch, MD      Insurance: Payor: MEDICARE  Plan: MEDICARE PART A AND B  Product Type: *No Product type*    CC:   No chief complaint on file.  LIST VS Filed Vitals:   03/31/13 1408  BP: 110/50  Pulse: 102  Height: 5\' 8"  (1.727 m)  Weight: 159 lb (72.122 kg)  SpO2: 95%    Weights Current Weight  03/31/13 159 lb (72.122 kg)  01/15/13 150 lb (68.04 kg)  01/11/13 164 lb (74.39 kg)    Blood Pressure  BP Readings from Last 3 Encounters:  03/31/13 110/50  01/15/13 134/80  01/11/13 128/64     Admit date:  (Not on file) Last encounter with RMR:  03/31/2013   Allergy Review of patient's allergies indicates no known allergies.  Current Outpatient Prescriptions  Medication Sig Dispense Refill  . albuterol (VENTOLIN HFA) 108 (90 BASE) MCG/ACT inhaler Inhale 2 puffs into the lungs every 6 (six) hours as needed. Shortness of breath      . cephALEXin (KEFLEX) 500 MG capsule Take 1 capsule (500 mg total) by mouth 3 (three) times daily.  21 capsule  0  . cyanocobalamin 1000 MCG tablet Take 1,000 mcg by mouth every morning.       . Fluticasone-Salmeterol (ADVAIR) 250-50 MCG/DOSE AEPB Inhale 1 puff into the lungs every 12 (twelve) hours.        . furosemide (LASIX) 40 MG tablet Take 20 mg by mouth every morning.       . insulin glargine (LANTUS) 100 UNIT/ML injection Inject 10 Units into the skin every morning.      Marland Kitchen ipratropium-albuterol (DUONEB) 0.5-2.5 (3) MG/3ML SOLN Take 3 mLs by nebulization 4 (four) times daily.      . iron polysaccharides (NIFEREX) 150 MG capsule Take 150 mg by mouth every morning. Iron supplementation for your anemia.      . mometasone (NASONEX) 50 MCG/ACT nasal spray Place 2 sprays into the nose daily.      . predniSONE (DELTASONE) 20 MG tablet Take 3 tablets (60 mg total) by mouth daily.  12 tablet  0  . roflumilast (DALIRESP) 500 MCG TABS tablet Take 500 mcg by mouth  daily with supper.       . simvastatin (ZOCOR) 40 MG tablet Take 40 mg by mouth daily with supper.       . Tamsulosin HCl (FLOMAX) 0.4 MG CAPS Take 0.8 mg by mouth daily after supper.      . tiotropium (SPIRIVA) 18 MCG inhalation capsule Place 18 mcg into inhaler and inhale daily.      Carlena Hurl 15 MG TABS tablet Take 15 mg by mouth daily with supper.        No current facility-administered medications for this visit.    Discontinued Meds:   There are no discontinued medications.  Patient Active Problem List   Diagnosis Date Noted  . Chronic respiratory failure with hypoxia 11/09/2012  . Pulmonary nodules 11/09/2012  . Anemia due to chronic blood loss 11/08/2012  . Lung mass 08/29/2012  . CKD (chronic kidney disease), stage III 08/29/2012  . Atrial flutter 04/29/2012  . Benign prostatic hypertrophy (BPH) with incomplete bladder emptying 11/15/2011  . DM type 2 (diabetes mellitus, type 2) 11/14/2011  . History of lung cancer 11/14/2011    LABS    Component Value Date/Time  NA 139 01/15/2013 2147   NA 138 12/16/2012 2226   NA 136 12/04/2012 0921   K 4.1 01/15/2013 2147   K 4.2 12/16/2012 2226   K 5.3* 12/04/2012 0921   CL 102 01/15/2013 2147   CL 101 12/16/2012 2226   CL 98 12/04/2012 0921   CO2 27 01/15/2013 2147   CO2 26 12/04/2012 0921   CO2 29 11/24/2012 1637   GLUCOSE 178* 01/15/2013 2147   GLUCOSE 184* 12/16/2012 2226   GLUCOSE 141* 12/04/2012 0921   BUN 20 01/15/2013 2147   BUN 22 12/16/2012 2226   BUN 20 12/04/2012 0921   CREATININE 1.36* 01/15/2013 2147   CREATININE 1.40* 12/16/2012 2226   CREATININE 1.15 12/04/2012 0921   CREATININE 1.62* 11/24/2012 1637   CREATININE 1.54* 08/03/2012 1500   CREATININE 1.75* 05/19/2012 1415   CALCIUM 9.8 01/15/2013 2147   CALCIUM 9.7 12/04/2012 0921   CALCIUM 9.5 11/24/2012 1637   GFRNONAA 48* 01/15/2013 2147   GFRNONAA 58* 12/04/2012 0921   GFRNONAA 41* 11/14/2012 0504   GFRAA 55* 01/15/2013 2147   GFRAA 67* 12/04/2012 0921   GFRAA 47*  11/14/2012 0504   CMP     Component Value Date/Time   NA 139 01/15/2013 2147   K 4.1 01/15/2013 2147   CL 102 01/15/2013 2147   CO2 27 01/15/2013 2147   GLUCOSE 178* 01/15/2013 2147   BUN 20 01/15/2013 2147   CREATININE 1.36* 01/15/2013 2147   CREATININE 1.62* 11/24/2012 1637   CALCIUM 9.8 01/15/2013 2147   PROT 7.6 11/08/2012 1732   ALBUMIN 3.4* 11/08/2012 1732   AST 16 11/08/2012 1732   ALT 14 11/08/2012 1732   ALKPHOS 68 11/08/2012 1732   BILITOT 0.3 11/08/2012 1732   GFRNONAA 48* 01/15/2013 2147   GFRAA 55* 01/15/2013 2147       Component Value Date/Time   WBC 7.9 01/15/2013 2147   WBC 5.0 12/04/2012 0921   WBC 9.8 11/24/2012 1637   HGB 10.7* 01/15/2013 2147   HGB 10.9* 12/16/2012 2226   HGB 10.2* 12/04/2012 0921   HCT 36.1* 01/15/2013 2147   HCT 32.0* 12/16/2012 2226   HCT 35.3* 12/04/2012 0921   MCV 82.0 01/15/2013 2147   MCV 77.4* 12/04/2012 0921   MCV 74.1* 11/24/2012 1637    Lipid Panel     Component Value Date/Time   CHOL 106 05/01/2012 0454   TRIG 47 05/01/2012 0454   HDL 64 05/01/2012 0454   CHOLHDL 1.7 05/01/2012 0454   VLDL 9 05/01/2012 0454   LDLCALC 33 05/01/2012 0454    ABG    Component Value Date/Time   PHART 7.390 11/13/2012 1444   PCO2ART 36.2 11/13/2012 1444   PO2ART 82.7 11/13/2012 1444   HCO3 21.4 11/13/2012 1444   TCO2 29 12/16/2012 2226   ACIDBASEDEF 2.7* 11/13/2012 1444   O2SAT 96.2 11/13/2012 1444     Lab Results  Component Value Date   TSH 1.635 11/08/2012   BNP (last 3 results)  Recent Labs  04/30/12 0907 08/28/12 1917 11/08/12 1732  PROBNP 1787.0* 1091.0* 993.1*   Cardiac Panel (last 3 results) No results found for this basename: CKTOTAL, CKMB, TROPONINI, RELINDX,  in the last 72 hours  Iron/TIBC/Ferritin    Component Value Date/Time   IRON 19* 11/08/2012 1732   TIBC 450* 11/08/2012 1732   FERRITIN 14* 11/08/2012 1732     EKG Orders placed in visit on 01/11/13  . EKG 12-LEAD     Prior Assessment and Plan Problem List  as of  03/31/2013     ICD-9-CM   DM type 2 (diabetes mellitus, type 2)   History of lung cancer   Last Assessment & Plan   06/18/2012 Office Visit Written 06/18/2012 11:32 AM by Jodelle Gross, NP     Abnormal CT scan as discussed above. He is encouraged to keep appointment with Dr. Juanetta Gosling for continue pulmonary management. He may need referral to oncologist once evaluated with his history of lung CA.    Benign prostatic hypertrophy (BPH) with incomplete bladder emptying   Last Assessment & Plan   01/11/2013 Office Visit Written 01/11/2013 11:49 AM by Marinus Maw, MD     At this point, the patient is a satisfactory surgical risk for prostate surgery.    Atrial flutter   Last Assessment & Plan   01/11/2013 Office Visit Written 01/11/2013 11:49 AM by Marinus Maw, MD     The patient is maintaining sinus rhythm very nicely after ablation. I've recommended that he discontinue his systemic anticoagulation and start aspirin 325 mg daily.    Lung mass   Last Assessment & Plan   10/27/2012 Office Visit Written 10/27/2012  2:08 PM by Jodelle Gross, NP     This is being followed for ongoing assessment via Dr. Juanetta Gosling. We will defer to him and oncology for consideration of treatment.    CKD (chronic kidney disease), stage III   Anemia due to chronic blood loss   Chronic respiratory failure with hypoxia   Last Assessment & Plan   01/11/2013 Office Visit Written 01/11/2013 11:50 AM by Marinus Maw, MD     He still has chronic dyspnea with exertion. I've encouraged the patient to increase his physical activity.    Pulmonary nodules       Imaging: No results found.

## 2013-04-02 ENCOUNTER — Ambulatory Visit (INDEPENDENT_AMBULATORY_CARE_PROVIDER_SITE_OTHER): Payer: Medicare Other | Admitting: *Deleted

## 2013-04-02 DIAGNOSIS — I10 Essential (primary) hypertension: Secondary | ICD-10-CM

## 2013-04-02 NOTE — Patient Instructions (Addendum)
WE WILL CALL YOU WITH ANY ADJUSTMENTS PER DR RR IF NOTED PLEASE BRING BOTTLES AT ALL APPOINTMENTS GOING FORWARD

## 2013-04-02 NOTE — Progress Notes (Signed)
Pt presented to office per recent OV pt was not clear on medications that he was currently taking Pt advised he feels good today, no complaints noted (pt is currently taking Lasix 20mg  once daily and additional 20mg  PRN per PCP) stopped taking lisinopril 2 months ago Please advise if any changes need to be made  [facility] A. Anthem Frazer L.P.N.

## 2013-04-05 NOTE — Progress Notes (Signed)
Patient ID: Brendan Hall, male   DOB: July 22, 1930, 79 y.o.   MRN: [id number]  Excellent-appreciate documentation of current medications, which appear appropriate and will be continued.  [place] Bing, MD

## 2013-12-01 ENCOUNTER — Other Ambulatory Visit: Payer: Self-pay | Admitting: Vascular Surgery

## 2013-12-01 DIAGNOSIS — I739 Peripheral vascular disease, unspecified: Secondary | ICD-10-CM

## 2014-01-18 ENCOUNTER — Encounter: Payer: Self-pay | Admitting: Vascular Surgery

## 2014-01-19 ENCOUNTER — Ambulatory Visit (HOSPITAL_COMMUNITY)
Admission: RE | Admit: 2014-01-19 | Discharge: 2014-01-19 | Disposition: A | Discharge status: Home / Self Care | Payer: Medicare Other | Source: Ambulatory Visit | Attending: Vascular Surgery | Admitting: Vascular Surgery

## 2014-01-19 ENCOUNTER — Encounter: Payer: Self-pay | Admitting: Vascular Surgery

## 2014-01-19 ENCOUNTER — Ambulatory Visit (INDEPENDENT_AMBULATORY_CARE_PROVIDER_SITE_OTHER): Payer: Medicare Other | Admitting: Vascular Surgery

## 2014-01-19 VITALS — BP 139/93 | HR 98 | Resp 22 | Ht 68.0 in | Wt 166.4 lb

## 2014-01-19 DIAGNOSIS — I70219 Atherosclerosis of native arteries of extremities with intermittent claudication, unspecified extremity: Secondary | ICD-10-CM | POA: Insufficient documentation

## 2014-01-19 DIAGNOSIS — M79605 Pain in left leg: Secondary | ICD-10-CM | POA: Insufficient documentation

## 2014-01-19 DIAGNOSIS — I739 Peripheral vascular disease, unspecified: Secondary | ICD-10-CM | POA: Insufficient documentation

## 2014-01-19 DIAGNOSIS — M79604 Pain in right leg: Secondary | ICD-10-CM | POA: Insufficient documentation

## 2014-01-19 DIAGNOSIS — I10 Essential (primary) hypertension: Secondary | ICD-10-CM | POA: Insufficient documentation

## 2014-01-19 DIAGNOSIS — E785 Hyperlipidemia, unspecified: Secondary | ICD-10-CM | POA: Insufficient documentation

## 2014-01-19 DIAGNOSIS — Z87891 Personal history of nicotine dependence: Secondary | ICD-10-CM | POA: Insufficient documentation

## 2014-01-19 DIAGNOSIS — E119 Type 2 diabetes mellitus without complications: Secondary | ICD-10-CM | POA: Insufficient documentation

## 2014-01-19 NOTE — Addendum Note (Signed)
Addended by: Dorthula Rue L on: 01/19/2014 04:39 PM   Modules accepted: Orders

## 2014-01-19 NOTE — Assessment & Plan Note (Signed)
He has evidence of infrainguinal arterial occlusive disease bilaterally.his symptoms are stable. I've encouraged him to stay as active as possible. Fortunately he is not a smoker. I'll see him back in 2 years and I ordered follow up ABIs for that time. He knows to call sooner if he has problems.

## 2014-01-19 NOTE — Progress Notes (Signed)
[facility] and [facility] Specialist of [facility]  Patient name: Brendan Hall MRN: [id number] DOB: September 13, 1930 Sex: male  REASON FOR VISIT: Follow up of peripheral vascular disease.  HPI: Brendan Hall is a 80 y.o. male who comes in for a 2 year follow up visit. He continues to have bilateral lower extremity claudication. This involves his calf and is brought on by cannulation and relieved with rest. However his activity is limited more by his shortness of breath. He wears oxygen at home. He denies any history of rest pain or nonhealing ulcers. He does not smoke.   Past Medical History  Diagnosis Date  . Arteriosclerotic cardiovascular disease (ASCVD)     Myocardial infarction in 1980; chronic dyspnea on exertion; H/o CHF;  04/2012-normal EF by echo  . Hyperlipidemia   . Hypertension   . Diabetes mellitus, type II   . Degenerative joint disease   . COPD (chronic obstructive pulmonary disease)     Home oxygen; pulmonary nodules  . Colon polyps 08/2011    Colonoscopy, Dr. Laural Golden.  Marland Kitchen BPH (benign prostatic hyperplasia)     Urinary retention-10/2011; right renal mass  . Iron deficiency anemia 11/14/2011  . Atrial flutter with rapid ventricular response 04/29/2012    s/p RFCA 12/04/12  . Lung cancer   . Peripheral vascular disease    Family History  Problem Relation Age of Onset  . Cancer Mother   . Cancer Sister   . Cancer Brother    SOCIAL HISTORY: History  Substance Use Topics  . Smoking status: Former Smoker -- 1.50 packs/day for 60 years    Types: Cigarettes    Quit date: 08/06/2008  . Smokeless tobacco: Never Used  . Alcohol Use: No     Comment: occasional    No Known Allergies Current Outpatient Prescriptions  Medication Sig Dispense Refill  . albuterol (VENTOLIN HFA) 108 (90 BASE) MCG/ACT inhaler Inhale 2 puffs into the lungs every 6 (six) hours as needed. Shortness of breath      . doxepin (SINEQUAN) 10 MG capsule Take 10 mg by mouth at bedtime.      .  Fluticasone-Salmeterol (ADVAIR) 250-50 MCG/DOSE AEPB Inhale 1 puff into the lungs every 12 (twelve) hours.        . furosemide (LASIX) 40 MG tablet Take 20 mg by mouth daily. CAN TAKE ADDITIONAL 20MG  PRN      . insulin glargine (LANTUS) 100 UNIT/ML injection Inject 10 Units into the skin every morning.      Marland Kitchen ipratropium-albuterol (DUONEB) 0.5-2.5 (3) MG/3ML SOLN Take 3 mLs by nebulization 4 (four) times daily.      . mometasone (NASONEX) 50 MCG/ACT nasal spray Place 2 sprays into the nose daily.      Marland Kitchen omeprazole (PRILOSEC) 20 MG capsule Take 20 mg by mouth daily.      . roflumilast (DALIRESP) 500 MCG TABS tablet Take 500 mcg by mouth daily with supper.       . simvastatin (ZOCOR) 40 MG tablet Take 40 mg by mouth daily with supper.       . Tamsulosin HCl (FLOMAX) 0.4 MG CAPS Take 0.8 mg by mouth daily after supper.      . tiotropium (SPIRIVA) 18 MCG inhalation capsule Place 18 mcg into inhaler and inhale daily.       No current facility-administered medications for this visit.   REVIEW OF SYSTEMS: Valu.Nieves ] denotes positive finding; [  ] denotes negative finding  CARDIOVASCULAR:  [ ]  chest pain   [ ]   chest pressure   [ ]  palpitations   Valu.Nieves ] orthopnea   Valu.Nieves ] dyspnea on exertion   Valu.Nieves ] claudication   [ ]  rest pain   [ ]  DVT   [ ]  phlebitis PULMONARY:   Valu.Nieves ] productive cough   [ ]  asthma   [ ]  wheezing NEUROLOGIC:   [ ]  weakness  [ ]  paresthesias  [ ]  aphasia  [ ]  amaurosis  [ ]  dizziness HEMATOLOGIC:   [ ]  bleeding problems   [ ]  clotting disorders MUSCULOSKELETAL:  [ ]  joint pain   [ ]  joint swelling [ ]  leg swelling GASTROINTESTINAL: [ ]   blood in stool  [ ]   hematemesis GENITOURINARY:  [ ]   dysuria  [ ]   hematuria PSYCHIATRIC:  [ ]  history of major depression INTEGUMENTARY:  [ ]  rashes  [ ]  ulcers CONSTITUTIONAL:  [ ]  fever   [ ]  chills  PHYSICAL EXAM: Filed Vitals:   01/19/14 1248  BP: 139/93  Pulse: 98  Resp: 22  Height: 5\' 8"  (1.727 m)  Weight: 166 lb 6.4 oz (75.479 kg)   Body mass  index is 25.31 kg/(m^2). GENERAL: The patient is a well-nourished male, in no acute distress. The vital signs are documented above. CARDIOVASCULAR: There is a regular rate and rhythm. Do not detect carotid bruits. He has palpable femoral pulses. I cannot palpate popliteal or pedal pulses. PULMONARY: There is good air exchange bilaterally without wheezing or rales. ABDOMEN: Soft and non-tender with normal pitched bowel sounds.  MUSCULOSKELETAL: There are no major deformities or cyanosis. NEUROLOGIC: No focal weakness or paresthesias are detected. SKIN: There are no ulcers or rashes noted. PSYCHIATRIC: The patient has a normal affect.  DATA:  I have independently interpreted his arterial Doppler study. He has biphasic Doppler signals in the dorsalis pedis and posterior tibial positions bilaterally. ABI on the right is 75%. ABI on the left is 65%.  MEDICAL ISSUES:  PVD (peripheral vascular disease) with claudication He has evidence of infrainguinal arterial occlusive disease bilaterally.his symptoms are stable. I've encouraged him to stay as active as possible. Fortunately he is not a smoker. I'll see him back in 2 years and I ordered follow up ABIs for that time. He knows to call sooner if he has problems.   [facility] [place] Beeper: [phone]

## 2014-02-25 ENCOUNTER — Encounter: Payer: Self-pay | Admitting: Adult Health

## 2014-02-25 ENCOUNTER — Ambulatory Visit (HOSPITAL_COMMUNITY)
Admission: RE | Admit: 2014-02-25 | Discharge: 2014-02-25 | Disposition: A | Discharge status: Home / Self Care | Payer: Medicare Other | Source: Ambulatory Visit | Attending: Adult Health | Admitting: Adult Health

## 2014-02-25 ENCOUNTER — Ambulatory Visit (INDEPENDENT_AMBULATORY_CARE_PROVIDER_SITE_OTHER): Payer: Medicare Other | Admitting: Adult Health

## 2014-02-25 VITALS — BP 122/78 | HR 104 | Ht 67.0 in | Wt 159.0 lb

## 2014-02-25 DIAGNOSIS — I7090 Unspecified atherosclerosis: Secondary | ICD-10-CM

## 2014-02-25 DIAGNOSIS — I2589 Other forms of chronic ischemic heart disease: Secondary | ICD-10-CM

## 2014-02-25 DIAGNOSIS — R0602 Shortness of breath: Secondary | ICD-10-CM

## 2014-02-25 DIAGNOSIS — J449 Chronic obstructive pulmonary disease, unspecified: Secondary | ICD-10-CM | POA: Insufficient documentation

## 2014-02-25 DIAGNOSIS — R918 Other nonspecific abnormal finding of lung field: Secondary | ICD-10-CM

## 2014-02-25 DIAGNOSIS — R222 Localized swelling, mass and lump, trunk: Secondary | ICD-10-CM

## 2014-02-25 DIAGNOSIS — Z85118 Personal history of other malignant neoplasm of bronchus and lung: Secondary | ICD-10-CM | POA: Insufficient documentation

## 2014-02-25 DIAGNOSIS — I4892 Unspecified atrial flutter: Secondary | ICD-10-CM

## 2014-02-25 DIAGNOSIS — I255 Ischemic cardiomyopathy: Secondary | ICD-10-CM

## 2014-02-25 DIAGNOSIS — J4489 Other specified chronic obstructive pulmonary disease: Secondary | ICD-10-CM | POA: Insufficient documentation

## 2014-02-25 DIAGNOSIS — I2510 Atherosclerotic heart disease of native coronary artery without angina pectoris: Secondary | ICD-10-CM

## 2014-02-25 NOTE — Assessment & Plan Note (Signed)
He has not been seen by cardiology in 3 years. I will recheck his echo. If signficantly depressed EF will need further evaluation. He shortness of breath is concerning for CHF, but worsening COPD could also be contributing. CXR is ordered

## 2014-02-25 NOTE — Patient Instructions (Signed)
Your physician recommends that you schedule a follow-up appointment in: 1 week  Your physician has requested that you have an echocardiogram. Echocardiography is a painless test that uses sound waves to create images of your heart. It provides your doctor with information about the size and shape of your heart and how well your heart's chambers and valves are working. This procedure takes approximately one hour. There are no restrictions for this procedure.  A chest x-ray takes a picture of the organs and structures inside the chest, including the heart, lungs, and blood vessels. This test can show several things, including, whether the heart is enlarges; whether fluid is building up in the lungs; and whether pacemaker / defibrillator leads are still in place.

## 2014-02-25 NOTE — Assessment & Plan Note (Signed)
Followed by Dr. Luan Pulling along with treatment for COPD.

## 2014-02-25 NOTE — Progress Notes (Signed)
Name: Brendan Hall    DOB: 1930/05/09  Age: 80 y.o.  MR#: [id number]       PCP:  Cleda Mccreedy, MD      Insurance: Payor: MEDICARE / Plan: MEDICARE PART A AND B / Product Type: *No Product type* /   CC:    Chief Complaint  Patient presents with  . Hypertension  . Atrial Flutter    VS Filed Vitals:   02/25/14 1330  BP: 122/78  Pulse: 104  Height: 5\' 7"  (1.702 m)  Weight: 159 lb (72.122 kg)    Weights Current Weight  02/25/14 159 lb (72.122 kg)  01/19/14 166 lb 6.4 oz (75.479 kg)  03/31/13 159 lb (72.122 kg)    Blood Pressure  BP Readings from Last 3 Encounters:  02/25/14 122/78  01/19/14 139/93  03/31/13 110/50     Admit date:  (Not on file) Last encounter with RMR:  Visit date not found   Allergy Review of patient's allergies indicates no known allergies.  Current Outpatient Prescriptions  Medication Sig Dispense Refill  . albuterol (VENTOLIN HFA) 108 (90 BASE) MCG/ACT inhaler Inhale 2 puffs into the lungs every 6 (six) hours as needed. Shortness of breath      . Fluticasone-Salmeterol (ADVAIR) 250-50 MCG/DOSE AEPB Inhale 1 puff into the lungs every 12 (twelve) hours.        . furosemide (LASIX) 40 MG tablet Take 20 mg by mouth daily. CAN TAKE ADDITIONAL 20MG  PRN      . insulin glargine (LANTUS) 100 UNIT/ML injection Inject 10 Units into the skin every morning.      Marland Kitchen ipratropium-albuterol (DUONEB) 0.5-2.5 (3) MG/3ML SOLN Take 3 mLs by nebulization 4 (four) times daily.      . mometasone (NASONEX) 50 MCG/ACT nasal spray Place 2 sprays into the nose daily.      Marland Kitchen omeprazole (PRILOSEC) 20 MG capsule Take 20 mg by mouth daily.      . roflumilast (DALIRESP) 500 MCG TABS tablet Take 500 mcg by mouth daily with supper.       . simvastatin (ZOCOR) 40 MG tablet Take 40 mg by mouth daily with supper.       . Tamsulosin HCl (FLOMAX) 0.4 MG CAPS Take 0.8 mg by mouth daily after supper.      . tiotropium (SPIRIVA) 18 MCG inhalation capsule Place 18 mcg into inhaler and  inhale daily.      Marland Kitchen doxepin (SINEQUAN) 10 MG capsule Take 10 mg by mouth at bedtime.       No current facility-administered medications for this visit.    Discontinued Meds:   There are no discontinued medications.  Patient Active Problem List   Diagnosis Date Noted  . Bilateral leg pain 01/19/2014  . PVD (peripheral vascular disease) with claudication 01/19/2014  . Pulmonary nodules 11/09/2012  . Anemia due to chronic blood loss 11/08/2012  . Lung mass 08/29/2012  . CKD (chronic kidney disease), stage III 08/29/2012  . Atrial flutter 04/29/2012  . Benign prostatic hypertrophy (BPH) with incomplete bladder emptying 11/15/2011  . DM type 2 (diabetes mellitus, type 2) 11/14/2011  . History of lung cancer 11/14/2011    LABS    Component Value Date/Time   NA 139 01/15/2013 2147   NA 138 12/16/2012 2226   NA 136 12/04/2012 0921   K 4.1 01/15/2013 2147   K 4.2 12/16/2012 2226   K 5.3* 12/04/2012 0921   CL 102 01/15/2013 2147   CL 101 12/16/2012 2226  CL 98 12/04/2012 0921   CO2 27 01/15/2013 2147   CO2 26 12/04/2012 0921   CO2 29 11/24/2012 1637   GLUCOSE 178* 01/15/2013 2147   GLUCOSE 184* 12/16/2012 2226   GLUCOSE 141* 12/04/2012 0921   BUN 20 01/15/2013 2147   BUN 22 12/16/2012 2226   BUN 20 12/04/2012 0921   CREATININE 1.36* 01/15/2013 2147   CREATININE 1.40* 12/16/2012 2226   CREATININE 1.15 12/04/2012 0921   CREATININE 1.62* 11/24/2012 1637   CREATININE 1.54* 08/03/2012 1500   CREATININE 1.75* 05/19/2012 1415   CALCIUM 9.8 01/15/2013 2147   CALCIUM 9.7 12/04/2012 0921   CALCIUM 9.5 11/24/2012 1637   GFRNONAA 48* 01/15/2013 2147   GFRNONAA 58* 12/04/2012 0921   GFRNONAA 41* 11/14/2012 0504   GFRAA 55* 01/15/2013 2147   GFRAA 67* 12/04/2012 0921   GFRAA 47* 11/14/2012 0504   CMP     Component Value Date/Time   NA 139 01/15/2013 2147   K 4.1 01/15/2013 2147   CL 102 01/15/2013 2147   CO2 27 01/15/2013 2147   GLUCOSE 178* 01/15/2013 2147   BUN 20 01/15/2013 2147   CREATININE 1.36*  01/15/2013 2147   CREATININE 1.62* 11/24/2012 1637   CALCIUM 9.8 01/15/2013 2147   PROT 7.6 11/08/2012 1732   ALBUMIN 3.4* 11/08/2012 1732   AST 16 11/08/2012 1732   ALT 14 11/08/2012 1732   ALKPHOS 68 11/08/2012 1732   BILITOT 0.3 11/08/2012 1732   GFRNONAA 48* 01/15/2013 2147   GFRAA 55* 01/15/2013 2147       Component Value Date/Time   WBC 7.9 01/15/2013 2147   WBC 5.0 12/04/2012 0921   WBC 9.8 11/24/2012 1637   HGB 10.7* 01/15/2013 2147   HGB 10.9* 12/16/2012 2226   HGB 10.2* 12/04/2012 0921   HCT 36.1* 01/15/2013 2147   HCT 32.0* 12/16/2012 2226   HCT 35.3* 12/04/2012 0921   MCV 82.0 01/15/2013 2147   MCV 77.4* 12/04/2012 0921   MCV 74.1* 11/24/2012 1637    Lipid Panel     Component Value Date/Time   CHOL 106 05/01/2012 0454   TRIG 47 05/01/2012 0454   HDL 64 05/01/2012 0454   CHOLHDL 1.7 05/01/2012 0454   VLDL 9 05/01/2012 0454   LDLCALC 33 05/01/2012 0454    ABG    Component Value Date/Time   PHART 7.390 11/13/2012 1444   PCO2ART 36.2 11/13/2012 1444   PO2ART 82.7 11/13/2012 1444   HCO3 21.4 11/13/2012 1444   TCO2 29 12/16/2012 2226   ACIDBASEDEF 2.7* 11/13/2012 1444   O2SAT 96.2 11/13/2012 1444     Lab Results  Component Value Date   TSH 1.635 11/08/2012   BNP (last 3 results) No results found for this basename: PROBNP,  in the last 8760 hours Cardiac Panel (last 3 results) No results found for this basename: CKTOTAL, CKMB, TROPONINI, RELINDX,  in the last 72 hours  Iron/TIBC/Ferritin    Component Value Date/Time   IRON 19* 11/08/2012 1732   TIBC 450* 11/08/2012 1732   FERRITIN 14* 11/08/2012 1732     EKG Orders placed in visit on 03/31/13  . EKG 12-LEAD     Prior Assessment and Plan Problem List as of 02/25/2014     Cardiovascular and Mediastinum   PVD (peripheral vascular disease) with claudication   Last Assessment & Plan   01/19/2014 Office Visit Written 01/19/2014  1:14 PM by Angelia Mould, MD     He has evidence of infrainguinal arterial occlusive  disease  bilaterally.his symptoms are stable. I've encouraged him to stay as active as possible. Fortunately he is not a smoker. I'll see him back in 2 years and I ordered follow up ABIs for that time. He knows to call sooner if he has problems.    Atrial flutter   Last Assessment & Plan   03/31/2013 Office Visit Edited 04/05/2013 10:06 AM by Yehuda Savannah, MD     Unless patient becomes symptomatic or future EKGs demonstrate recurrent arrhythmia, routine cardiology care no longer appears to be necessary. Aspirin dose decreased to 81 mg per day. Patient uncertain of his medications, but we verified that he is no longer taking rivaroxaban.      Endocrine   DM type 2 (diabetes mellitus, type 2)     Genitourinary   Benign prostatic hypertrophy (BPH) with incomplete bladder emptying   Last Assessment & Plan   03/31/2013 Office Visit Written 03/31/2013  4:42 PM by Yehuda Savannah, MD     Urinary catheter remains in place, but removal is planned for the near future. TURP was apparently successful and well tolerated.    CKD (chronic kidney disease), stage III     Other   History of lung cancer   Last Assessment & Plan   06/18/2012 Office Visit Written 06/18/2012 11:32 AM by Lendon Colonel, NP     Abnormal CT scan as discussed above. He is encouraged to keep appointment with Dr. Luan Pulling for continue pulmonary management. He may need referral to oncologist once evaluated with his history of lung CA.    Lung mass   Last Assessment & Plan   10/27/2012 Office Visit Written 10/27/2012  2:08 PM by Lendon Colonel, NP     This is being followed for ongoing assessment via Dr. Luan Pulling. We will defer to him and oncology for consideration of treatment.    Anemia due to chronic blood loss   Pulmonary nodules   Bilateral leg pain       Imaging: No results found.

## 2014-02-25 NOTE — Progress Notes (Signed)
HPI: Mr. Edelman is an 80 year old patient to be est. with Dr. Harl Bowie we are following for ongoing assessment and management of hypertension, atrial flutter, status post radiofrequency ablation. He has a remote history of CAD, with a reported myocardial infarction in 1980 with normal LV function per echo in June of 2013. He is no longer on anticoagulation therapy. He was last seen by Dr. Lattie Haw in May of 2014. He was stable at that time. He was to been seen on PRN basis.        He comes today with worsening DOE, PND, and coughing. He is wearing home 02 at night and is now using it during the day. He complains of PND and orthopnea.    Denies chest pain, dizziness or fatigue.   No Known Allergies  Current Outpatient Prescriptions  Medication Sig Dispense Refill  . albuterol (VENTOLIN HFA) 108 (90 BASE) MCG/ACT inhaler Inhale 2 puffs into the lungs every 6 (six) hours as needed. Shortness of breath      . Fluticasone-Salmeterol (ADVAIR) 250-50 MCG/DOSE AEPB Inhale 1 puff into the lungs every 12 (twelve) hours.        . furosemide (LASIX) 40 MG tablet Take 20 mg by mouth daily. CAN TAKE ADDITIONAL 20MG  PRN      . insulin glargine (LANTUS) 100 UNIT/ML injection Inject 10 Units into the skin every morning.      Marland Kitchen ipratropium-albuterol (DUONEB) 0.5-2.5 (3) MG/3ML SOLN Take 3 mLs by nebulization 4 (four) times daily.      . mometasone (NASONEX) 50 MCG/ACT nasal spray Place 2 sprays into the nose daily.      Marland Kitchen omeprazole (PRILOSEC) 20 MG capsule Take 20 mg by mouth daily.      . roflumilast (DALIRESP) 500 MCG TABS tablet Take 500 mcg by mouth daily with supper.       . simvastatin (ZOCOR) 40 MG tablet Take 40 mg by mouth daily with supper.       . Tamsulosin HCl (FLOMAX) 0.4 MG CAPS Take 0.8 mg by mouth daily after supper.      . tiotropium (SPIRIVA) 18 MCG inhalation capsule Place 18 mcg into inhaler and inhale daily.      Marland Kitchen doxepin (SINEQUAN) 10 MG capsule Take 10 mg by mouth at bedtime.         No current facility-administered medications for this visit.    Past Medical History  Diagnosis Date  . Arteriosclerotic cardiovascular disease (ASCVD)     Myocardial infarction in 1980; chronic dyspnea on exertion; H/o CHF;  04/2012-normal EF by echo  . Hyperlipidemia   . Hypertension   . Diabetes mellitus, type II   . Degenerative joint disease   . COPD (chronic obstructive pulmonary disease)     Home oxygen; pulmonary nodules  . Colon polyps 08/2011    Colonoscopy, Dr. Laural Golden.  Marland Kitchen BPH (benign prostatic hyperplasia)     Urinary retention-10/2011; right renal mass  . Iron deficiency anemia 11/14/2011  . Atrial flutter with rapid ventricular response 04/29/2012    s/p RFCA 12/04/12  . Lung cancer   . Peripheral vascular disease     Past Surgical History  Procedure Laterality Date  . Soft tissue cyst excision      Face  . Colonoscopy  08/28/2011    Procedure: COLONOSCOPY;  Surgeon: Rogene Houston, MD;  Location: [facility];  Service: Endoscopy;  Laterality: N/A;  . Radiofrequency ablation  11/2012    Atrial flutter    NWG:NFAOZH  of systems complete and found to be negative unless listed above  PHYSICAL EXAM BP 122/78  Pulse 104  Ht 5\' 7"  (1.702 m)  Wt 159 lb (72.122 kg)  BMI 24.90 kg/m2 General: Well developed, well nourished, in no acute distress Head: Eyes PERRLA, No xanthomas.   Normal cephalic and atramatic  Lungs: Bilateral rales and rhonchi, no wheezes except at the apex. Heart: HRRR S1 S2, distant heart sounds,.  Pulses are 2+ & equal.            No carotid bruit. No JVD.  No abdominal bruits. No femoral bruits. Abdomen: Bowel sounds are positive, abdomen soft and non-tender without masses or                  Hernia's noted. Msk:  Back normal, normal gait. Normal strength and tone for age. Extremities: No clubbing, cyanosis or edema.  DP +1 Neuro: Alert and oriented X 3. Psych:  Good affect, responds appropriately   EKG:  Sinus tachycardia with 1st degree  AV block.  ASSESSMENT AND PLAN

## 2014-02-25 NOTE — Assessment & Plan Note (Signed)
Heart sounds are distant but EKG demonstrates NSR. He is not on anticoagulation at this time. I will have echo repeated.

## 2014-03-02 ENCOUNTER — Other Ambulatory Visit (HOSPITAL_COMMUNITY): Payer: Medicare Other

## 2014-03-02 ENCOUNTER — Ambulatory Visit (HOSPITAL_COMMUNITY)
Admission: RE | Admit: 2014-03-02 | Discharge: 2014-03-02 | Disposition: A | Discharge status: Home / Self Care | Payer: Medicare Other | Source: Ambulatory Visit | Attending: Adult Health | Admitting: Adult Health

## 2014-03-02 DIAGNOSIS — I10 Essential (primary) hypertension: Secondary | ICD-10-CM | POA: Insufficient documentation

## 2014-03-02 DIAGNOSIS — J449 Chronic obstructive pulmonary disease, unspecified: Secondary | ICD-10-CM | POA: Insufficient documentation

## 2014-03-02 DIAGNOSIS — R0989 Other specified symptoms and signs involving the circulatory and respiratory systems: Secondary | ICD-10-CM | POA: Insufficient documentation

## 2014-03-02 DIAGNOSIS — I2589 Other forms of chronic ischemic heart disease: Secondary | ICD-10-CM | POA: Insufficient documentation

## 2014-03-02 DIAGNOSIS — I2510 Atherosclerotic heart disease of native coronary artery without angina pectoris: Secondary | ICD-10-CM | POA: Insufficient documentation

## 2014-03-02 DIAGNOSIS — I517 Cardiomegaly: Secondary | ICD-10-CM

## 2014-03-02 DIAGNOSIS — Z85118 Personal history of other malignant neoplasm of bronchus and lung: Secondary | ICD-10-CM | POA: Insufficient documentation

## 2014-03-02 DIAGNOSIS — E785 Hyperlipidemia, unspecified: Secondary | ICD-10-CM | POA: Insufficient documentation

## 2014-03-02 DIAGNOSIS — E119 Type 2 diabetes mellitus without complications: Secondary | ICD-10-CM | POA: Insufficient documentation

## 2014-03-02 DIAGNOSIS — J4489 Other specified chronic obstructive pulmonary disease: Secondary | ICD-10-CM | POA: Insufficient documentation

## 2014-03-02 DIAGNOSIS — I255 Ischemic cardiomyopathy: Secondary | ICD-10-CM

## 2014-03-02 DIAGNOSIS — R0609 Other forms of dyspnea: Secondary | ICD-10-CM | POA: Insufficient documentation

## 2014-03-02 NOTE — Addendum Note (Signed)
Addended by: Truett Mainland on: 03/02/2014 03:21 PM   Modules accepted: Orders

## 2014-03-02 NOTE — Progress Notes (Signed)
*  PRELIMINARY RESULTS* Echocardiogram 2D Echocardiogram has been performed.  Brendan Hall 03/02/2014, 12:19 PM

## 2014-03-07 ENCOUNTER — Ambulatory Visit (INDEPENDENT_AMBULATORY_CARE_PROVIDER_SITE_OTHER): Payer: Medicare Other | Admitting: Cardiology

## 2014-03-07 VITALS — BP 130/79 | HR 106 | Ht 67.0 in | Wt 165.0 lb

## 2014-03-07 DIAGNOSIS — R0602 Shortness of breath: Secondary | ICD-10-CM

## 2014-03-07 DIAGNOSIS — I7090 Unspecified atherosclerosis: Secondary | ICD-10-CM

## 2014-03-07 DIAGNOSIS — I2510 Atherosclerotic heart disease of native coronary artery without angina pectoris: Secondary | ICD-10-CM

## 2014-03-07 MED ORDER — FUROSEMIDE 40 MG PO TABS: 40.0000 mg | ORAL_TABLET | Freq: Every day | ORAL | Status: AC

## 2014-03-07 NOTE — Patient Instructions (Signed)
   Increase Lasix to 40mg  daily - new sent to pharm Continue all other medications.   Your physician wants you to follow up in:  3 months.  You will receive a reminder letter in the mail one-two months in advance.  If you don't receive a letter, please call our office to schedule the follow up appointment

## 2014-03-07 NOTE — Progress Notes (Signed)
Clinical Summary Brendan Hall is a 80 y.o.male last seen by NP Purcell Nails, this is our first visit together. He is seen for the following medical problems.  1. Dyspnea - worsening symptoms during last visit with NP Purcell Nails earlier this month, with + PND and LE edema - echo 02/2014 showed LVEF 50-55%, could not evaluate diastolic dysfunction, dilated IVC with normal resp response. Presumed diastolic dysfunction  2. Aflutter - prior ablation, no longer on anticoag -denies any palpitations  3. PAD - followed by vascular Dr Scot Dock  4. COPD - followed by Dr Luan Pulling. - compliant with inhalers, he is on home O2   Past Medical History  Diagnosis Date  . Arteriosclerotic cardiovascular disease (ASCVD)     Myocardial infarction in 1980; chronic dyspnea on exertion; H/o CHF;  04/2012-normal EF by echo  . Hyperlipidemia   . Hypertension   . Diabetes mellitus, type II   . Degenerative joint disease   . COPD (chronic obstructive pulmonary disease)     Home oxygen; pulmonary nodules  . Colon polyps 08/2011    Colonoscopy, Dr. Laural Golden.  Marland Kitchen BPH (benign prostatic hyperplasia)     Urinary retention-10/2011; right renal mass  . Iron deficiency anemia 11/14/2011  . Atrial flutter with rapid ventricular response 04/29/2012    s/p RFCA 12/04/12  . Lung cancer   . Peripheral vascular disease      No Known Allergies   Current Outpatient Prescriptions  Medication Sig Dispense Refill  . albuterol (VENTOLIN HFA) 108 (90 BASE) MCG/ACT inhaler Inhale 2 puffs into the lungs every 6 (six) hours as needed. Shortness of breath      . doxepin (SINEQUAN) 10 MG capsule Take 10 mg by mouth at bedtime.      . Fluticasone-Salmeterol (ADVAIR) 250-50 MCG/DOSE AEPB Inhale 1 puff into the lungs every 12 (twelve) hours.        . furosemide (LASIX) 40 MG tablet Take 20 mg by mouth daily. CAN TAKE ADDITIONAL 20MG  PRN      . insulin glargine (LANTUS) 100 UNIT/ML injection Inject 10 Units into the skin every  morning.      Marland Kitchen ipratropium-albuterol (DUONEB) 0.5-2.5 (3) MG/3ML SOLN Take 3 mLs by nebulization 4 (four) times daily.      . mometasone (NASONEX) 50 MCG/ACT nasal spray Place 2 sprays into the nose daily.      Marland Kitchen omeprazole (PRILOSEC) 20 MG capsule Take 20 mg by mouth daily.      . roflumilast (DALIRESP) 500 MCG TABS tablet Take 500 mcg by mouth daily with supper.       . simvastatin (ZOCOR) 40 MG tablet Take 40 mg by mouth daily with supper.       . Tamsulosin HCl (FLOMAX) 0.4 MG CAPS Take 0.8 mg by mouth daily after supper.      . tiotropium (SPIRIVA) 18 MCG inhalation capsule Place 18 mcg into inhaler and inhale daily.       No current facility-administered medications for this visit.     Past Surgical History  Procedure Laterality Date  . Soft tissue cyst excision      Face  . Colonoscopy  08/28/2011    Procedure: COLONOSCOPY;  Surgeon: Rogene Houston, MD;  Location: [facility];  Service: Endoscopy;  Laterality: N/A;  . Radiofrequency ablation  11/2012    Atrial flutter     No Known Allergies    Family History  Problem Relation Age of Onset  . Cancer Mother   .  Cancer Sister   . Cancer Brother      Social History Brendan Hall reports that he quit smoking about 5 years ago. His smoking use included Cigarettes. He has a 90 pack-year smoking history. He has never used smokeless tobacco. Brendan Hall reports that he does not drink alcohol.   Review of Systems CONSTITUTIONAL: No weight loss, fever, chills, weakness or fatigue.  HEENT: Eyes: No visual loss, blurred vision, double vision or yellow sclerae.No hearing loss, sneezing, congestion, runny nose or sore throat.  SKIN: No rash or itching.  CARDIOVASCULAR: per HPI RESPIRATORY:  + SOB, + cough GASTROINTESTINAL: No anorexia, nausea, vomiting or diarrhea. No abdominal pain or blood.  GENITOURINARY: No burning on urination, no polyuria NEUROLOGICAL: No headache, dizziness, syncope, paralysis, ataxia, numbness  or tingling in the extremities. No change in bowel or bladder control.  MUSCULOSKELETAL: No muscle, back pain, joint pain or stiffness.  LYMPHATICS: No enlarged nodes. No history of splenectomy.  PSYCHIATRIC: No history of depression or anxiety.  ENDOCRINOLOGIC: No reports of sweating, cold or heat intolerance. No polyuria or polydipsia.  Marland Kitchen   Physical Examination p 100 bp 130/79 Wt 165 lbs BMI 26 Gen: resting comfortably, no acute distress HEENT: no scleral icterus, pupils equal round and reactive, no palptable cervical adenopathy,  CV: RRR, no m/r/g, no JVD, no carotid bruits Resp: bilateral expiratory wheeze GI: abdomen is soft, non-tender, non-distended, normal bowel sounds, no hepatosplenomegaly MSK: extremities are warm, no edema.  Skin: warm, no rash Neuro:  no focal deficits Psych: appropriate affect   Diagnostic Studies 01/2014 ABI Right 0.75 Left 0.65  02/2014 Echo Study data: Technically difficult study. - Procedure narrative: Transthoracic echocardiography. Image quality was suboptimal. The study was technically difficult, as a result of poor sound wave transmission. - Left ventricle: The cavity size was normal. Wall thickness was increased in a pattern of mild LVH. Systolic function was normal. The estimated ejection fraction was in the range of 50% to 55%. The study is not technically sufficient to allow evaluation of LV diastolic function. - Aortic valve: Mildly calcified annulus. Mildly thickened leaflets. Valve area: 1.79cm^2(VTI). - Mitral valve: Mildly calcified annulus. Mildly thickened leaflets . - Left atrium: The atrium was mildly dilated. - Systemic veins: The IVC is dilated with normal respiratory response. Estimated RA pressure is 8 mmHg.    Assessment and Plan   1. Dyspnea - main component is his COPD - recent echo shows normal LV systolic function, unable to evaluate diastolic function but does have enlarged LA and dilated IVC, suspect he does  have heart failure w/ preserved LVEF - will increase lasix to 40mg  daily.  2. Aflutter - no evidence of recurrence after RFA, has not been on anticoag  3. PAD - continue follow up with vascular  4. COPD - continue f/u with Dr Luan Pulling, continue home oxygen.     Arnoldo Lenis, M.D., F.A.C.C.

## 2014-06-02 ENCOUNTER — Encounter: Payer: Self-pay | Admitting: Cardiology

## 2014-06-02 ENCOUNTER — Ambulatory Visit (INDEPENDENT_AMBULATORY_CARE_PROVIDER_SITE_OTHER): Payer: Medicare Other | Admitting: Cardiology

## 2014-06-02 VITALS — BP 107/74 | HR 99 | Ht 66.0 in | Wt 159.0 lb

## 2014-06-02 DIAGNOSIS — I7090 Unspecified atherosclerosis: Secondary | ICD-10-CM

## 2014-06-02 DIAGNOSIS — I4892 Unspecified atrial flutter: Secondary | ICD-10-CM

## 2014-06-02 DIAGNOSIS — I5032 Chronic diastolic (congestive) heart failure: Secondary | ICD-10-CM

## 2014-06-02 DIAGNOSIS — I2510 Atherosclerotic heart disease of native coronary artery without angina pectoris: Secondary | ICD-10-CM

## 2014-06-02 NOTE — Patient Instructions (Signed)
Continue all current medications. Your physician wants you to follow up in:  1 year.  You will receive a reminder letter in the mail one-two months in advance.  If you don't receive a letter, please call our office to schedule the follow up appointment   

## 2014-06-02 NOTE — Progress Notes (Signed)
Clinical Summary Brendan Hall is a 80 y.o.male seen today for follow up of the following medical problems.   1. Chronic diastolic heart failure - echo 02/2014 showed LVEF 50-55%, could not evaluate diastolic dysfunction, dilated IVC with normal resp response. Presumed diastolic dysfunction  - reports stable DOE since last visit, stable at 2-3 blocks - no LE edema, no orthopnea  2. Aflutter  - prior ablation, no longer on anticoag  -denies any palpitations   3. PAD  - followed by vascular Dr Scot Dock   4. COPD  - followed by Dr Luan Pulling.  - compliant with inhalers, he is on home O2  5. Hyperlipidemia - 01/2014 HDL 81 LDL 30 TG 82 - compliant with statin  Past Medical History  Diagnosis Date  . Arteriosclerotic cardiovascular disease (ASCVD)     Myocardial infarction in 1980; chronic dyspnea on exertion; H/o CHF;  04/2012-normal EF by echo  . Hyperlipidemia   . Hypertension   . Diabetes mellitus, type II   . Degenerative joint disease   . COPD (chronic obstructive pulmonary disease)     Home oxygen; pulmonary nodules  . Colon polyps 08/2011    Colonoscopy, Dr. Laural Golden.  Marland Kitchen BPH (benign prostatic hyperplasia)     Urinary retention-10/2011; right renal mass  . Iron deficiency anemia 11/14/2011  . Atrial flutter with rapid ventricular response 04/29/2012    s/p RFCA 12/04/12  . Lung cancer   . Peripheral vascular disease      No Known Allergies   Current Outpatient Prescriptions  Medication Sig Dispense Refill  . albuterol (VENTOLIN HFA) 108 (90 BASE) MCG/ACT inhaler Inhale 2 puffs into the lungs every 6 (six) hours as needed. Shortness of breath      . doxepin (SINEQUAN) 10 MG capsule Take 10 mg by mouth at bedtime.      . Fluticasone-Salmeterol (ADVAIR) 250-50 MCG/DOSE AEPB Inhale 1 puff into the lungs every 12 (twelve) hours.        . furosemide (LASIX) 40 MG tablet Take 1 tablet (40 mg total) by mouth daily. CAN TAKE ADDITIONAL 20MG  PRN  30 tablet  6  . insulin  glargine (LANTUS) 100 UNIT/ML injection Inject 10 Units into the skin every morning.      Marland Kitchen ipratropium-albuterol (DUONEB) 0.5-2.5 (3) MG/3ML SOLN Take 3 mLs by nebulization 4 (four) times daily.      . mometasone (NASONEX) 50 MCG/ACT nasal spray Place 2 sprays into the nose daily.      Marland Kitchen omeprazole (PRILOSEC) 20 MG capsule Take 20 mg by mouth daily.      . roflumilast (DALIRESP) 500 MCG TABS tablet Take 500 mcg by mouth daily with supper.       . simvastatin (ZOCOR) 40 MG tablet Take 40 mg by mouth daily with supper.       . Tamsulosin HCl (FLOMAX) 0.4 MG CAPS Take 0.8 mg by mouth daily after supper.      . tiotropium (SPIRIVA) 18 MCG inhalation capsule Place 18 mcg into inhaler and inhale daily.       No current facility-administered medications for this visit.     Past Surgical History  Procedure Laterality Date  . Soft tissue cyst excision      Face  . Colonoscopy  08/28/2011    Procedure: COLONOSCOPY;  Surgeon: Rogene Houston, MD;  Location: [facility];  Service: Endoscopy;  Laterality: N/A;  . Radiofrequency ablation  11/2012    Atrial flutter     No  Known Allergies    Family History  Problem Relation Age of Onset  . Cancer Mother   . Cancer Sister   . Cancer Brother      Social History Brendan Hall reports that he quit smoking about 5 years ago. His smoking use included Cigarettes. He has a 90 pack-year smoking history. He has never used smokeless tobacco. Brendan Hall reports that he does not drink alcohol.   Review of Systems CONSTITUTIONAL: No weight loss, fever, chills, weakness or fatigue.  HEENT: Eyes: No visual loss, blurred vision, double vision or yellow sclerae.No hearing loss, sneezing, congestion, runny nose or sore throat.  SKIN: No rash or itching.  CARDIOVASCULAR: per HPI RESPIRATORY: +SOB.  GASTROINTESTINAL: No anorexia, nausea, vomiting or diarrhea. No abdominal pain or blood.  GENITOURINARY: No burning on urination, no  polyuria NEUROLOGICAL: No headache, dizziness, syncope, paralysis, ataxia, numbness or tingling in the extremities. No change in bowel or bladder control.  MUSCULOSKELETAL: No muscle, back pain, joint pain or stiffness.  LYMPHATICS: No enlarged nodes. No history of splenectomy.  PSYCHIATRIC: No history of depression or anxiety.  ENDOCRINOLOGIC: No reports of sweating, cold or heat intolerance. No polyuria or polydipsia.  Marland Kitchen   Physical Examination p 99 bp 107/74 Wt 159 lbs BMI 26 Gen: resting comfortably, no acute distress HEENT: no scleral icterus, pupils equal round and reactive, no palptable cervical adenopathy,  CV: RRR, no m/r/g,no JVD Resp: mild pronlongation of expiratory phase GI: abdomen is soft, non-tender, non-distended, normal bowel sounds, no hepatosplenomegaly MSK: extremities are warm, no edema.  Skin: warm, no rash Neuro:  no focal deficits Psych: appropriate affect   Diagnostic Studies 01/2014 ABI  Right 0.75 Left 0.65  02/2014 Echo  Study data: Technically difficult study. - Procedure narrative: Transthoracic echocardiography. Image quality was suboptimal. The study was technically difficult, as a result of poor sound wave transmission. - Left ventricle: The cavity size was normal. Wall thickness was increased in a pattern of mild LVH. Systolic function was normal. The estimated ejection fraction was in the range of 50% to 55%. The study is not technically sufficient to allow evaluation of LV diastolic function. - Aortic valve: Mildly calcified annulus. Mildly thickened leaflets. Valve area: 1.79cm^2(VTI). - Mitral valve: Mildly calcified annulus. Mildly thickened leaflets . - Left atrium: The atrium was mildly dilated. - Systemic veins: The IVC is dilated with normal respiratory response. Estimated RA pressure is 8 mmHg.      Assessment and Plan  1. Chronic diastolic heart failure - main component of his DOE is COPD - recent echo shows normal LV  systolic function, unable to evaluate diastolic function but does have enlarged LA and dilated IVC, suspect he does have heart failure w/ preserved LVEF  - continue current diuretic dosing, appears euvolemic  2. Aflutter  - no evidence of recurrence after RFA, has not been on anticoag   3. PAD  - continue follow up with vascular   4. COPD  - continue f/u with Dr Luan Pulling, continue home oxygen    F/u 1 year   Arnoldo Lenis, M.D., F.A.C.C.

## 2014-06-08 ENCOUNTER — Ambulatory Visit: Payer: Medicare Other | Admitting: Cardiology

## 2014-07-15 ENCOUNTER — Ambulatory Visit (INDEPENDENT_AMBULATORY_CARE_PROVIDER_SITE_OTHER): Payer: Medicare Other | Admitting: Cardiology

## 2014-07-15 ENCOUNTER — Encounter: Payer: Self-pay | Admitting: Cardiology

## 2014-07-15 ENCOUNTER — Ambulatory Visit: Payer: Medicare Other | Admitting: Cardiology

## 2014-07-15 VITALS — BP 108/73 | HR 100 | Ht 67.0 in | Wt 156.0 lb

## 2014-07-15 DIAGNOSIS — I7090 Unspecified atherosclerosis: Secondary | ICD-10-CM

## 2014-07-15 DIAGNOSIS — I4892 Unspecified atrial flutter: Secondary | ICD-10-CM

## 2014-07-15 DIAGNOSIS — I441 Atrioventricular block, second degree: Secondary | ICD-10-CM

## 2014-07-15 DIAGNOSIS — I2510 Atherosclerotic heart disease of native coronary artery without angina pectoris: Secondary | ICD-10-CM

## 2014-07-15 DIAGNOSIS — I5032 Chronic diastolic (congestive) heart failure: Secondary | ICD-10-CM

## 2014-07-15 NOTE — Progress Notes (Signed)
Clinical Summary Brendan Hall is a 80 y.o.male seen today for follow up of the following medical problems.   1. Chronic diastolic heart failure  - echo 02/2014 showed LVEF 50-55%, could not evaluate diastolic dysfunction, dilated IVC with normal resp response. -Presumed diastolic dysfunction  - reports stable DOE since last visit, stable at 2-3 blocks  - no LE edema, no orthopnea  - recently seen at [facility] urgent care with cough, given course of abx. CXR at that visit did not show any pulmonary edema.   2. Aflutter  - prior ablation, no longer on anticoag  -denies any palpitations  - not requiring any AV nodal agents  3. PAD  - followed by vascular Dr Scot Dock   4. COPD  - followed by Dr Luan Pulling.  - compliant with inhalers, he is on home O2   5. Hyperlipidemia  - 01/2014 HDL 81 LDL 30 TG 82  - compliant with statin  6. Heart block - EKG from urgent visit 07/11/14  showed second degree type I av block. This has been noted before and is chronic. - reports some occasional lightheadness when active on his feet - no presyncope or syncope - he is on no AV nodal agents.    Past Medical History  Diagnosis Date  . Arteriosclerotic cardiovascular disease (ASCVD)     Myocardial infarction in 1980; chronic dyspnea on exertion; H/o CHF;  04/2012-normal EF by echo  . Hyperlipidemia   . Hypertension   . Diabetes mellitus, type II   . Degenerative joint disease   . COPD (chronic obstructive pulmonary disease)     Home oxygen; pulmonary nodules  . Colon polyps 08/2011    Colonoscopy, Dr. Laural Golden.  Marland Kitchen BPH (benign prostatic hyperplasia)     Urinary retention-10/2011; right renal mass  . Iron deficiency anemia 11/14/2011  . Atrial flutter with rapid ventricular response 04/29/2012    s/p RFCA 12/04/12  . Lung cancer   . Peripheral vascular disease      No Known Allergies   Current Outpatient Prescriptions  Medication Sig Dispense Refill  . albuterol (VENTOLIN HFA) 108 (90  BASE) MCG/ACT inhaler Inhale 2 puffs into the lungs every 6 (six) hours as needed. Shortness of breath      . doxepin (SINEQUAN) 10 MG capsule Take 10 mg by mouth at bedtime.      . Fluticasone-Salmeterol (ADVAIR) 250-50 MCG/DOSE AEPB Inhale 1 puff into the lungs every 12 (twelve) hours.        . furosemide (LASIX) 40 MG tablet Take 1 tablet (40 mg total) by mouth daily. CAN TAKE ADDITIONAL 20MG  PRN  30 tablet  6  . insulin glargine (LANTUS) 100 UNIT/ML injection Inject 10 Units into the skin every morning.      Marland Kitchen ipratropium-albuterol (DUONEB) 0.5-2.5 (3) MG/3ML SOLN Take 3 mLs by nebulization 4 (four) times daily.      . mometasone (NASONEX) 50 MCG/ACT nasal spray Place 2 sprays into the nose daily.      Marland Kitchen omeprazole (PRILOSEC) 20 MG capsule Take 20 mg by mouth daily.      . roflumilast (DALIRESP) 500 MCG TABS tablet Take 500 mcg by mouth daily with supper.       . simvastatin (ZOCOR) 40 MG tablet Take 40 mg by mouth daily with supper.       . Tamsulosin HCl (FLOMAX) 0.4 MG CAPS Take 0.8 mg by mouth daily after supper.      . tiotropium (SPIRIVA) 18 MCG inhalation  capsule Place 18 mcg into inhaler and inhale daily.       No current facility-administered medications for this visit.     Past Surgical History  Procedure Laterality Date  . Soft tissue cyst excision      Face  . Colonoscopy  08/28/2011    Procedure: COLONOSCOPY;  Surgeon: Rogene Houston, MD;  Location: [facility];  Service: Endoscopy;  Laterality: N/A;  . Radiofrequency ablation  11/2012    Atrial flutter     No Known Allergies    Family History  Problem Relation Age of Onset  . Cancer Mother   . Cancer Sister   . Cancer Brother      Social History Mr. Want reports that he quit smoking about 5 years ago. His smoking use included Cigarettes. He has a 90 pack-year smoking history. He has never used smokeless tobacco. Mr. Jelley reports that he does not drink alcohol.   Review of  Systems CONSTITUTIONAL: No weight loss, fever, chills, weakness or fatigue.  HEENT: Eyes: No visual loss, blurred vision, double vision or yellow sclerae.No hearing loss, sneezing, congestion, runny nose or sore throat.  SKIN: No rash or itching.  CARDIOVASCULAR: per HPI RESPIRATORY: +cough GASTROINTESTINAL: No anorexia, nausea, vomiting or diarrhea. No abdominal pain or blood.  GENITOURINARY: No burning on urination, no polyuria NEUROLOGICAL: No headache, dizziness, syncope, paralysis, ataxia, numbness or tingling in the extremities. No change in bowel or bladder control.  MUSCULOSKELETAL: No muscle, back pain, joint pain or stiffness.  LYMPHATICS: No enlarged nodes. No history of splenectomy.  PSYCHIATRIC: No history of depression or anxiety.  ENDOCRINOLOGIC: No reports of sweating, cold or heat intolerance. No polyuria or polydipsia.  Marland Kitchen   Physical Examination p 100 bp 108/73 Wt 156 lbs BMI 24 Gen: resting comfortably, no acute distress HEENT: no scleral icterus, pupils equal round and reactive, no palptable cervical adenopathy,  CV:RRR, no m/r/g, no jVD Resp: right sided wheezing GI: abdomen is soft, non-tender, non-distended, normal bowel sounds, no hepatosplenomegaly MSK: extremities are warm, no edema.  Skin: warm, no rash Neuro:  no focal deficits Psych: appropriate affect   Diagnostic Studies 01/2014 ABI  Right 0.75 Left 0.65   02/2014 Echo  Study data: Technically difficult study. - Procedure narrative: Transthoracic echocardiography. Image quality was suboptimal. The study was technically difficult, as a result of poor sound wave transmission. - Left ventricle: The cavity size was normal. Wall thickness was increased in a pattern of mild LVH. Systolic function was normal. The estimated ejection fraction was in the range of 50% to 55%. The study is not technically sufficient to allow evaluation of LV diastolic function. - Aortic valve: Mildly calcified annulus.  Mildly thickened leaflets. Valve area: 1.79cm^2(VTI). - Mitral valve: Mildly calcified annulus. Mildly thickened leaflets . - Left atrium: The atrium was mildly dilated. - Systemic veins: The IVC is dilated with normal respiratory response. Estimated RA pressure is 8 mmHg.      Assessment and Plan  1. Chronic diastolic heart failure  - main component of his DOE is COPD  - recent echo shows normal LV systolic function, unable to evaluate diastolic function but does have enlarged LA and dilated IVC, suspect he does have heart failure w/ preserved LVEF  - continue current diuretic dosing, appears euvolemic   2. Aflutter  - no evidence of recurrence after RFA, has not been on anticoag  - continue to follow clinically  3. PAD  - continue follow up with vascular   4.  COPD  - continue f/u with Dr Luan Pulling, continue home oxygen  5. 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  F/u 3 months     Arnoldo Lenis, M.D., F.A.C.C.

## 2014-07-15 NOTE — Patient Instructions (Signed)
   Continue all current medications. Your physician wants you to follow up in:  3 months.

## 2014-10-24 ENCOUNTER — Ambulatory Visit (INDEPENDENT_AMBULATORY_CARE_PROVIDER_SITE_OTHER): Payer: Medicare Other | Admitting: Cardiology

## 2014-10-24 ENCOUNTER — Encounter: Payer: Self-pay | Admitting: Cardiology

## 2014-10-24 VITALS — BP 102/65 | HR 95 | Ht 67.0 in | Wt 157.0 lb

## 2014-10-24 DIAGNOSIS — I5032 Chronic diastolic (congestive) heart failure: Secondary | ICD-10-CM

## 2014-10-24 DIAGNOSIS — I4892 Unspecified atrial flutter: Secondary | ICD-10-CM

## 2014-10-24 DIAGNOSIS — I739 Peripheral vascular disease, unspecified: Secondary | ICD-10-CM

## 2014-10-24 DIAGNOSIS — I2510 Atherosclerotic heart disease of native coronary artery without angina pectoris: Secondary | ICD-10-CM

## 2014-10-24 DIAGNOSIS — I441 Atrioventricular block, second degree: Secondary | ICD-10-CM

## 2014-10-24 MED ORDER — ASPIRIN EC 81 MG PO TBEC: 81.0000 mg | DELAYED_RELEASE_TABLET | Freq: Every day | ORAL | Status: AC

## 2014-10-24 NOTE — Patient Instructions (Signed)
Continue all current medications. Follow up in  4 months

## 2014-10-24 NOTE — Progress Notes (Signed)
Clinical Summary Mr. Scheibel is a 80 y.o.male seen today for follow up of the following medical problems.   1. Chronic diastolic heart failure  - echo 02/2014 showed LVEF 50-55%, could not evaluate diastolic dysfunction, dilated IVC with normal resp response. -Presumed diastolic dysfunction   - reports some occasional SOB. DOE at 1-2 blocks. No LE edema, + orthopnea + PND x 1 week. Compliant with lasix 40mg  daily, does not taken any additional.    2. Aflutter  - prior ablation, no longer on anticoag  -denies any palpitations, has not required AV nodal agents  3. PAD  - followed by vascular Dr Scot Dock   4. COPD  - followed by Dr Luan Pulling.  - compliant with inhalers, he is on home O2   5. Hyperlipidemia  -last lipid panel 01/2014 HDL 81 LDL 30 TG 82  - compliant with statin  6. Heart block- Mobitz I - EKG from urgent visit 07/11/14 showed second degree type I av block. This has been noted before and is chronic. - denies any lightheadedness, dizziness or significant fatigue  7. CKD Last GR 49 by pvp 01/2014, K 4.7.  - followed by pcp  Past Medical History  Diagnosis Date  . Arteriosclerotic cardiovascular disease (ASCVD)     Myocardial infarction in 1980; chronic dyspnea on exertion; H/o CHF;  04/2012-normal EF by echo  . Hyperlipidemia   . Hypertension   . Diabetes mellitus, type II   . Degenerative joint disease   . COPD (chronic obstructive pulmonary disease)     Home oxygen; pulmonary nodules  . Colon polyps 08/2011    Colonoscopy, Dr. Laural Golden.  Marland Kitchen BPH (benign prostatic hyperplasia)     Urinary retention-10/2011; right renal mass  . Iron deficiency anemia 11/14/2011  . Atrial flutter with rapid ventricular response 04/29/2012    s/p RFCA 12/04/12  . Lung cancer   . Peripheral vascular disease      No Known Allergies   Current Outpatient Prescriptions  Medication Sig Dispense Refill  . albuterol (VENTOLIN HFA) 108 (90 BASE) MCG/ACT inhaler Inhale 2  puffs into the lungs every 6 (six) hours as needed. Shortness of breath    . amoxicillin (AMOXIL) 500 MG capsule Take 500 mg by mouth 3 (three) times daily.    . benzonatate (TESSALON) 100 MG capsule Take 100 mg by mouth 3 (three) times daily as needed for cough.    . doxepin (SINEQUAN) 10 MG capsule Take 10 mg by mouth at bedtime.    . Fluticasone-Salmeterol (ADVAIR) 250-50 MCG/DOSE AEPB Inhale 1 puff into the lungs every 12 (twelve) hours.      . furosemide (LASIX) 40 MG tablet Take 1 tablet (40 mg total) by mouth daily. CAN TAKE ADDITIONAL 20MG  PRN 30 tablet 6  . insulin glargine (LANTUS) 100 UNIT/ML injection Inject 10 Units into the skin every morning.    Marland Kitchen ipratropium-albuterol (DUONEB) 0.5-2.5 (3) MG/3ML SOLN Take 3 mLs by nebulization 4 (four) times daily.    Marland Kitchen omeprazole (PRILOSEC) 20 MG capsule Take 20 mg by mouth daily.    . roflumilast (DALIRESP) 500 MCG TABS tablet Take 500 mcg by mouth daily with supper.     . simvastatin (ZOCOR) 40 MG tablet Take 40 mg by mouth daily with supper.     . Tamsulosin HCl (FLOMAX) 0.4 MG CAPS Take 0.8 mg by mouth daily after supper.     No current facility-administered medications for this visit.     Past Surgical History  Procedure Laterality Date  . Soft tissue cyst excision      Face  . Colonoscopy  08/28/2011    Procedure: COLONOSCOPY;  Surgeon: Rogene Houston, MD;  Location: [facility];  Service: Endoscopy;  Laterality: N/A;  . Radiofrequency ablation  11/2012    Atrial flutter     No Known Allergies    Family History  Problem Relation Age of Onset  . Cancer Mother   . Cancer Sister   . Cancer Brother      Social History Mr. Wohler reports that he quit smoking about 6 years ago. His smoking use included Cigarettes. He has a 90 pack-year smoking history. He has never used smokeless tobacco. Mr. Staniszewski reports that he does not drink alcohol.   Review of Systems CONSTITUTIONAL: No weight loss, fever, chills,  weakness or fatigue.  HEENT: Eyes: No visual loss, blurred vision, double vision or yellow sclerae.No hearing loss, sneezing, congestion, runny nose or sore throat.  SKIN: No rash or itching.  CARDIOVASCULAR: per HPI RESPIRATORY: No shortness of breath, cough or sputum.  GASTROINTESTINAL: No anorexia, nausea, vomiting or diarrhea. No abdominal pain or blood.  GENITOURINARY: No burning on urination, no polyuria NEUROLOGICAL: No headache, dizziness, syncope, paralysis, ataxia, numbness or tingling in the extremities. No change in bowel or bladder control.  MUSCULOSKELETAL: No muscle, back pain, joint pain or stiffness.  LYMPHATICS: No enlarged nodes. No history of splenectomy.  PSYCHIATRIC: No history of depression or anxiety.  ENDOCRINOLOGIC: No reports of sweating, cold or heat intolerance. No polyuria or polydipsia.  Marland Kitchen   Physical Examination p 95 bp 102/65 Wt 157 lbs BMI 25 Gen: resting comfortably, no acute distress HEENT: no scleral icterus, pupils equal round and reactive, no palptable cervical adenopathy,  CV: RRR, no m/r/g, no JVD, no carotid bruits Resp: Clear to auscultation bilaterally GI: abdomen is soft, non-tender, non-distended, normal bowel sounds, no hepatosplenomegaly MSK: extremities are warm, no edema.  Skin: warm, no rash Neuro:  no focal deficits Psych: appropriate affect   Diagnostic Studies  01/2014 ABI  Right 0.75 Left 0.65   02/2014 Echo  Study data: Technically difficult study. - Procedure narrative: Transthoracic echocardiography. Image quality was suboptimal. The study was technically difficult, as a result of poor sound wave transmission. - Left ventricle: The cavity size was normal. Wall thickness was increased in a pattern of mild LVH. Systolic function was normal. The estimated ejection fraction was in the range of 50% to 55%. The study is not technically sufficient to allow evaluation of LV diastolic function. - Aortic valve: Mildly  calcified annulus. Mildly thickened leaflets. Valve area: 1.79cm^2(VTI). - Mitral valve: Mildly calcified annulus. Mildly thickened leaflets . - Left atrium: The atrium was mildly dilated. - Systemic veins: The IVC is dilated with normal respiratory response. Estimated RA pressure is 8 mmHg.     Assessment and Plan  1. Chronic diastolic heart failure  - main component overall of his DOE is COPD  - recent echo shows normal LV systolic function, unable to evaluate diastolic function but does have enlarged LA and dilated IVC, suspect he does have heart failure w/ preserved LVEF   - currently appears euvolemic, reports some orthopnea and PND at times. Counseled ok to take additional lasix 20mg  if needed at these times, educated on daily weights.   2. Aflutter  - no evidence of recurrence after RFA, has not been on anticoag  - continue to follow clinically  3. PAD  - continue follow up with  vascular   4. COPD  - continue f/u with Dr Luan Pulling, continue home oxygen  5. 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  6. CKD - management per pcp, not on ACE or ARB presume due to borderline high K.      Arnoldo Lenis, M.D.

## 2014-11-03 ENCOUNTER — Encounter (HOSPITAL_COMMUNITY): Payer: Self-pay | Admitting: Internal Medicine

## 2014-11-17 ENCOUNTER — Emergency Department (HOSPITAL_COMMUNITY): Payer: Medicare Other

## 2014-11-17 ENCOUNTER — Encounter (HOSPITAL_COMMUNITY): Payer: Self-pay

## 2014-11-17 ENCOUNTER — Emergency Department (HOSPITAL_COMMUNITY)
Admission: EM | Admit: 2014-11-17 | Discharge: 2014-11-17 | Disposition: A | Discharge status: Home / Self Care | Payer: Medicare Other | Attending: Emergency Medicine | Admitting: Emergency Medicine

## 2014-11-17 DIAGNOSIS — Z7982 Long term (current) use of aspirin: Secondary | ICD-10-CM | POA: Insufficient documentation

## 2014-11-17 DIAGNOSIS — Z87448 Personal history of other diseases of urinary system: Secondary | ICD-10-CM | POA: Insufficient documentation

## 2014-11-17 DIAGNOSIS — Z86010 Personal history of colonic polyps: Secondary | ICD-10-CM | POA: Diagnosis not present

## 2014-11-17 DIAGNOSIS — Z7951 Long term (current) use of inhaled steroids: Secondary | ICD-10-CM | POA: Diagnosis not present

## 2014-11-17 DIAGNOSIS — Z792 Long term (current) use of antibiotics: Secondary | ICD-10-CM | POA: Diagnosis not present

## 2014-11-17 DIAGNOSIS — Z862 Personal history of diseases of the blood and blood-forming organs and certain disorders involving the immune mechanism: Secondary | ICD-10-CM | POA: Diagnosis not present

## 2014-11-17 DIAGNOSIS — Z87891 Personal history of nicotine dependence: Secondary | ICD-10-CM | POA: Insufficient documentation

## 2014-11-17 DIAGNOSIS — R05 Cough: Secondary | ICD-10-CM | POA: Diagnosis present

## 2014-11-17 DIAGNOSIS — I2510 Atherosclerotic heart disease of native coronary artery without angina pectoris: Secondary | ICD-10-CM | POA: Diagnosis not present

## 2014-11-17 DIAGNOSIS — R059 Cough, unspecified: Secondary | ICD-10-CM

## 2014-11-17 DIAGNOSIS — J441 Chronic obstructive pulmonary disease with (acute) exacerbation: Secondary | ICD-10-CM | POA: Insufficient documentation

## 2014-11-17 DIAGNOSIS — E119 Type 2 diabetes mellitus without complications: Secondary | ICD-10-CM | POA: Insufficient documentation

## 2014-11-17 DIAGNOSIS — Z85118 Personal history of other malignant neoplasm of bronchus and lung: Secondary | ICD-10-CM | POA: Diagnosis not present

## 2014-11-17 DIAGNOSIS — I10 Essential (primary) hypertension: Secondary | ICD-10-CM | POA: Diagnosis not present

## 2014-11-17 DIAGNOSIS — Z8739 Personal history of other diseases of the musculoskeletal system and connective tissue: Secondary | ICD-10-CM | POA: Diagnosis not present

## 2014-11-17 DIAGNOSIS — Z794 Long term (current) use of insulin: Secondary | ICD-10-CM | POA: Insufficient documentation

## 2014-11-17 DIAGNOSIS — E785 Hyperlipidemia, unspecified: Secondary | ICD-10-CM | POA: Insufficient documentation

## 2014-11-17 LAB — BASIC METABOLIC PANEL
Anion gap: 6 (ref 5–15)
BUN: 31 mg/dL — AB (ref 6–23)
CHLORIDE: 103 meq/L (ref 96–112)
CO2: 27 mmol/L (ref 19–32)
CREATININE: 1.77 mg/dL — AB (ref 0.50–1.35)
Calcium: 9.3 mg/dL (ref 8.4–10.5)
GFR calc non Af Amer: 34 mL/min — ABNORMAL LOW (ref >90)
GFR, EST AFRICAN AMERICAN: 39 mL/min — AB (ref >90)
GLUCOSE: 104 mg/dL — AB (ref 70–99)
Potassium: 4.6 mmol/L (ref 3.5–5.1)
Sodium: 136 mmol/L (ref 135–145)

## 2014-11-17 LAB — CBC WITH DIFFERENTIAL/PLATELET
BASOS ABS: 0 10*3/uL (ref 0.0–0.1)
Basophils Relative: 0 % (ref 0–1)
EOS PCT: 2 % (ref 0–5)
Eosinophils Absolute: 0.2 10*3/uL (ref 0.0–0.7)
HCT: 43.3 % (ref 39.0–52.0)
Hemoglobin: 13.6 g/dL (ref 13.0–17.0)
LYMPHS PCT: 23 % (ref 12–46)
Lymphs Abs: 1.6 10*3/uL (ref 0.7–4.0)
MCH: 29 pg (ref 26.0–34.0)
MCHC: 31.4 g/dL (ref 30.0–36.0)
MCV: 92.3 fL (ref 78.0–100.0)
Monocytes Absolute: 0.8 10*3/uL (ref 0.1–1.0)
Monocytes Relative: 11 % (ref 3–12)
NEUTROS PCT: 64 % (ref 43–77)
Neutro Abs: 4.7 10*3/uL (ref 1.7–7.7)
PLATELETS: 223 10*3/uL (ref 150–400)
RBC: 4.69 MIL/uL (ref 4.22–5.81)
RDW: 13.4 % (ref 11.5–15.5)
WBC: 7.3 10*3/uL (ref 4.0–10.5)

## 2014-11-17 LAB — LACTIC ACID, PLASMA: LACTIC ACID, VENOUS: 1.1 mmol/L (ref 0.5–2.2)

## 2014-11-17 LAB — BRAIN NATRIURETIC PEPTIDE: B Natriuretic Peptide: 169 pg/mL — ABNORMAL HIGH (ref 0.0–100.0)

## 2014-11-17 MED ORDER — PREDNISONE 20 MG PO TABS: 40.0000 mg | ORAL_TABLET | Freq: Every day | ORAL | Status: DC

## 2014-11-17 MED ORDER — BENZONATATE 100 MG PO CAPS: 100.0000 mg | ORAL_CAPSULE | Freq: Three times a day (TID) | ORAL | Status: AC | PRN

## 2014-11-17 MED ORDER — ALBUTEROL SULFATE (2.5 MG/3ML) 0.083% IN NEBU
2.5000 mg | INHALATION_SOLUTION | Freq: Once | RESPIRATORY_TRACT | Status: AC
  Administered 2014-11-17: 2.5 mg via RESPIRATORY_TRACT
  Filled 2014-11-17: qty 3

## 2014-11-17 MED ORDER — PREDNISONE 20 MG PO TABS: 40.0000 mg | ORAL_TABLET | Freq: Every day | ORAL | Status: AC

## 2014-11-17 MED ORDER — ALBUTEROL SULFATE HFA 108 (90 BASE) MCG/ACT IN AERS: 2.0000 | INHALATION_SPRAY | RESPIRATORY_TRACT | Status: DC | PRN

## 2014-11-17 MED ORDER — IPRATROPIUM-ALBUTEROL 0.5-2.5 (3) MG/3ML IN SOLN
3.0000 mL | Freq: Once | RESPIRATORY_TRACT | Status: AC
  Administered 2014-11-17: 3 mL via RESPIRATORY_TRACT
  Filled 2014-11-17: qty 3

## 2014-11-17 MED ORDER — ALBUTEROL SULFATE HFA 108 (90 BASE) MCG/ACT IN AERS: 2.0000 | INHALATION_SPRAY | RESPIRATORY_TRACT | Status: AC | PRN

## 2014-11-17 MED ORDER — PREDNISONE 50 MG PO TABS
60.0000 mg | ORAL_TABLET | Freq: Once | ORAL | Status: AC
  Administered 2014-11-17: 60 mg via ORAL
  Filled 2014-11-17 (×2): qty 1

## 2014-11-17 NOTE — ED Notes (Signed)
Pt ambulated independently around nurse's station with standby assist. Steady gait. O2 saturation 92-95% while ambulating. Denies sob. Nad noted.

## 2014-11-17 NOTE — ED Notes (Signed)
Pt reports productive cough with yellow sputum x 3 weeks.  Denies fever.  Also c/o pain and swelling to left hand since yesterday.  Denies injury.

## 2014-11-17 NOTE — ED Provider Notes (Signed)
CSN: [id number]     Arrival date & time 11/17/14  1023 History   First MD Initiated Contact with Patient 11/17/14 1046     Chief Complaint  Patient presents with  . Cough      HPI Pt was seen at 1115. Per pt, c/o gradual onset and persistence of constant cough for the past 3 weeks. Pt states he was evaluated by his PMD for same, rx levaquin without relief. Denies CP/palpitations, no SOB, no back pain, no abd pain, no N/V/D, no fevers. Pt also c/o gradual onset and persistence of left dorsal hand "swelling" since last night. Pt cannot recall injury. Denies pain, no rash, no wounds, no erythema, no ecchymosis, no focal motor weakness, no tingling/numbness in extremities.    Past Medical History  Diagnosis Date  . Arteriosclerotic cardiovascular disease (ASCVD)     Myocardial infarction in 1980; chronic dyspnea on exertion; H/o CHF;  04/2012-normal EF by echo  . Hyperlipidemia   . Hypertension   . Diabetes mellitus, type II   . Degenerative joint disease   . COPD (chronic obstructive pulmonary disease)     Home oxygen; pulmonary nodules  . Colon polyps 08/2011    Colonoscopy, Dr. Laural Golden.  Marland Kitchen BPH (benign prostatic hyperplasia)     Urinary retention-10/2011; right renal mass  . Iron deficiency anemia 11/14/2011  . Atrial flutter with rapid ventricular response 04/29/2012    s/p RFCA 12/04/12  . Lung cancer   . Peripheral vascular disease    Past Surgical History  Procedure Laterality Date  . Soft tissue cyst excision      Face  . Colonoscopy  08/28/2011    Procedure: COLONOSCOPY;  Surgeon: Rogene Houston, MD;  Location: [facility];  Service: Endoscopy;  Laterality: N/A;  . Radiofrequency ablation  11/2012    Atrial flutter  . Atrial flutter ablation N/A 12/04/2012    Procedure: ATRIAL FLUTTER ABLATION;  Surgeon: Evans Lance, MD;  Location: [facility];  Service: Cardiovascular;  Laterality: N/A;   Family History  Problem Relation Age of Onset  . Cancer Mother   . Cancer  Sister   . Cancer Brother    History  Substance Use Topics  . Smoking status: Former Smoker -- 1.50 packs/day for 60 years    Types: Cigarettes    Start date: 04/08/1947    Quit date: 08/06/2008  . Smokeless tobacco: Never Used  . Alcohol Use: No     Comment: occasional     Review of Systems ROS: Statement: All systems negative except as marked or noted in the HPI; Constitutional: Negative for fever and chills. ; ; Eyes: Negative for eye pain, redness and discharge. ; ; ENMT: Negative for ear pain, hoarseness, nasal congestion, sinus pressure and sore throat. ; ; Cardiovascular: Negative for chest pain, palpitations, diaphoresis, dyspnea and peripheral edema. ; ; Respiratory: +cough. Negative for wheezing and stridor. ; ; Gastrointestinal: Negative for nausea, vomiting, diarrhea, abdominal pain, blood in stool, hematemesis, jaundice and rectal bleeding. . ; ; Genitourinary: Negative for dysuria, flank pain and hematuria. ; ; Musculoskeletal: +left dorsal hand swelling. Negative for back pain and neck pain. Negative for deformity and trauma.; ; Skin: Negative for pruritus, rash, abrasions, blisters, bruising and skin lesion.; ; Neuro: Negative for headache, lightheadedness and neck stiffness. Negative for weakness, altered level of consciousness , altered mental status, extremity weakness, paresthesias, involuntary movement, seizure and syncope.      Allergies  Review of patient's allergies indicates no known  allergies.  Home Medications   Prior to Admission medications   Medication Sig Start Date End Date Taking? Authorizing Provider  allopurinol (ZYLOPRIM) 300 MG tablet Take 300 mg by mouth daily. 11/14/14  Yes Historical Provider, MD  aspirin EC 81 MG tablet Take 1 tablet (81 mg total) by mouth daily. 10/24/14  Yes Arnoldo Lenis, MD  Fluticasone Furoate-Vilanterol (BREO ELLIPTA) 100-25 MCG/INH AEPB Inhale 1 puff into the lungs every morning.   Yes Historical Provider, MD   furosemide (LASIX) 40 MG tablet Take 1 tablet (40 mg total) by mouth daily. CAN TAKE ADDITIONAL 20MG  PRN Patient taking differently: Take 20-40 mg by mouth 2 (two) times daily. Patient takes 40 mg in the morning and 20 mg in the evening 03/07/14  Yes Arnoldo Lenis, MD  insulin glargine (LANTUS) 100 UNIT/ML injection Inject 15 Units into the skin every morning.  12/05/12  Yes Grayson, PA-C  ipratropium-albuterol (DUONEB) 0.5-2.5 (3) MG/3ML SOLN Take 3 mLs by nebulization 4 (four) times daily. 05/03/12  Yes Rexene Alberts, MD  levofloxacin (LEVAQUIN) 750 MG tablet Take 750 mg by mouth daily. 11/14/14  Yes Historical Provider, MD  roflumilast (DALIRESP) 500 MCG TABS tablet Take 500 mcg by mouth daily with supper.    Yes Historical Provider, MD  simvastatin (ZOCOR) 40 MG tablet Take 40 mg by mouth daily with supper.  10/29/12  Yes Historical Provider, MD  Tamsulosin HCl (FLOMAX) 0.4 MG CAPS Take 0.8 mg by mouth daily after supper.   Yes Historical Provider, MD  albuterol (VENTOLIN HFA) 108 (90 BASE) MCG/ACT inhaler Inhale 2 puffs into the lungs every 6 (six) hours as needed. Shortness of breath    Historical Provider, MD   BP 124/71 mmHg  Pulse 97  Temp(Src) 98.2 F (36.8 C) (Oral)  Resp 18  Ht 5\' 7"  (1.702 m)  Wt 161 lb (73.029 kg)  BMI 25.21 kg/m2  SpO2 95% Physical Exam  1120: Physical examination:  Nursing notes reviewed; Vital signs and O2 SAT reviewed;  Constitutional: Well developed, Well nourished, Well hydrated, In no acute distress; Head:  Normocephalic, atraumatic; Eyes: EOMI, PERRL, No scleral icterus; ENMT: Mouth and pharynx normal, Mucous membranes moist; Neck: Supple, Full range of motion, No lymphadenopathy; Cardiovascular: Regular rate and rhythm, No gallop; Respiratory: Breath sounds coarse & equal bilaterally, No wheezes. +non-productive cough during exam. Speaking full sentences with ease, Normal respiratory effort/excursion; Chest: Nontender, Movement normal; Abdomen:  Soft, Nontender, Nondistended, Normal bowel sounds; Genitourinary: No CVA tenderness; Extremities: Pulses normal, No tenderness, +mild localized edema to left hand dorsal 2nd and 3rd MCP areas without overlying warmth, erythema, ecchymosis, fluctuance or wounds. Brisk cap refill in fingertips. NMS intact left hand. NT left hand/wrist/elbow/shoulder. No calf edema or asymmetry.; Neuro: AA&Ox3, Major CN grossly intact.  Speech clear. No gross focal motor or sensory deficits in extremities.; Skin: Color normal, Warm, Dry.   ED Course  Procedures     EKG Interpretation None      MDM  MDM Reviewed: previous chart, nursing note and vitals Reviewed previous: labs Interpretation: labs and x-ray      Results for orders placed or performed during the hospital encounter of 11/17/14  CBC with Differential  Result Value Ref Range   WBC 7.3 4.0 - 10.5 K/uL   RBC 4.69 4.22 - 5.81 MIL/uL   Hemoglobin 13.6 13.0 - 17.0 g/dL   HCT 43.3 39.0 - 52.0 %   MCV 92.3 78.0 - 100.0 fL   MCH 29.0 26.0 -  34.0 pg   MCHC 31.4 30.0 - 36.0 g/dL   RDW 13.4 11.5 - 15.5 %   Platelets 223 150 - 400 K/uL   Neutrophils Relative % 64 43 - 77 %   Neutro Abs 4.7 1.7 - 7.7 K/uL   Lymphocytes Relative 23 12 - 46 %   Lymphs Abs 1.6 0.7 - 4.0 K/uL   Monocytes Relative 11 3 - 12 %   Monocytes Absolute 0.8 0.1 - 1.0 K/uL   Eosinophils Relative 2 0 - 5 %   Eosinophils Absolute 0.2 0.0 - 0.7 K/uL   Basophils Relative 0 0 - 1 %   Basophils Absolute 0.0 0.0 - 0.1 K/uL  Basic metabolic panel  Result Value Ref Range   Sodium 136 135 - 145 mmol/L   Potassium 4.6 3.5 - 5.1 mmol/L   Chloride 103 96 - 112 mEq/L   CO2 27 19 - 32 mmol/L   Glucose, Bld 104 (H) 70 - 99 mg/dL   BUN 31 (H) 6 - 23 mg/dL   Creatinine, Ser 1.77 (H) 0.50 - 1.35 mg/dL   Calcium 9.3 8.4 - 10.5 mg/dL   GFR calc non Af Amer 34 (L) >90 mL/min   GFR calc Af Amer 39 (L) >90 mL/min   Anion gap 6 5 - 15  Brain natriuretic peptide  Result Value Ref Range    B Natriuretic Peptide 169.0 (H) 0.0 - 100.0 pg/mL  Lactic acid, plasma  Result Value Ref Range   Lactic Acid, Venous 1.1 0.5 - 2.2 mmol/L   Dg Chest 2 View 11/17/2014   CLINICAL DATA:  80 year old diabetic hypertensive male with cough and shortness of breath for 3 weeks. History of lung cancer. Initial encounter.  EXAM: CHEST  2 VIEW  COMPARISON:  Several prior exams most recent 07/11/2014.  FINDINGS: Abnormal appearance of the right hilar region similar to prior exams (including 08/03/2012) suggestive of post therapy changes without superimposed infiltrate, pneumothorax or pulmonary edema.  If there is a high clinical concern of recurrent tumor, contrast enhanced CT recommended.  Mild mediastinal shift to the right stable.  Heart size within normal limits.  Calcified aorta.  IMPRESSION: Stable post therapy examination without evidence of infiltrate, congestive heart failure or pneumothorax. Please see above.   Electronically Signed   By: Chauncey Cruel M.D.   On: 11/17/2014 11:25   Dg Hand Complete Left 11/17/2014   CLINICAL DATA:  Pain and swelling mid hand for 1 day. No trauma history  EXAM: LEFT HAND - COMPLETE 3+ VIEW  COMPARISON:  None.  FINDINGS: Frontal, oblique, and lateral views were obtained. No fracture or dislocation appreciable. There is moderate joint space narrowing at all MCP joints with bony overgrowth of the distal second, third, and fourth distal metacarpals. There is also moderate narrowing at all PIP and DIP joints with spur formation in multiple distal joints. There is advanced osteoarthritis in the saddle joint. There is milder osteoarthritic change in the scaphotrapezial and radiocarpal joints. There is no erosive change or periostitis. No bony destruction. The bones are mildly osteoporotic. There is no soft tissue abscess appreciable.  IMPRESSION: Multilevel osteoarthritic change, most marked in the saddle joint. No fracture or dislocation. No erosive change or bony destruction.    Electronically Signed   By: Lowella Grip M.D.   On: 11/17/2014 11:27    1415:  No overt CHF on CXR; BNP non-specifically/mildly elevated. BUN/Cr near baseline. Pt states he "feels better" after neb.  NAD, lungs CTA bilat, no  wheezing, resps easy, speaking full sentences, Sats 99% R/A.  Pt ambulated around the ED with Sats remaining 92-95 % R/A, resps easy, NAD.  Pt states he wants to go home now. Will tx for likely COPD exacerbation (already on levaquin). No s/s infection left hand; tx symptomatically at this time. Dx and testing d/w pt and family.  Questions answered.  Verb understanding, agreeable to d/c home with outpt f/u.     Francine Graven, DO 11/20/14 1019

## 2014-11-17 NOTE — Discharge Instructions (Signed)
°Emergency Department Resource Guide °1) Find a Doctor and Pay Out of Pocket °Although you won't have to find out who is covered by your insurance plan, it is a good idea to ask around and get recommendations. You will then need to call the office and see if the doctor you have chosen will accept you as a new patient and what types of options they offer for patients who are self-pay. Some doctors offer discounts or will set up payment plans for their patients who do not have insurance, but you will need to ask so you aren't surprised when you get to your appointment. ° °2) Contact Your Local Health Department °Not all health departments have doctors that can see patients for sick visits, but many do, so it is worth a call to see if yours does. If you don't know where your local health department is, you can check in your phone book. The CDC also has a tool to help you locate your state's health department, and many state websites also have listings of all of their local health departments. ° °3) Find a Walk-in Clinic °If your illness is not likely to be very severe or complicated, you may want to try a walk in clinic. These are popping up all over the country in pharmacies, drugstores, and shopping centers. They're usually staffed by nurse practitioners or physician assistants that have been trained to treat common illnesses and complaints. They're usually fairly quick and inexpensive. However, if you have serious medical issues or chronic medical problems, these are probably not your best option. ° °No Primary Care Doctor: °- Call [facility] at  832-8000 - they can help you locate a primary care doctor that  accepts your insurance, provides certain services, etc. °- Physician Referral Service- 1-800-533-3463 ° °Chronic Pain Problems: °Organization         Address  Phone   Notes  °[facility]  (336) 297-2271 Patients need to be referred by their primary care doctor.  ° °Medication  Assistance: °Organization         Address  Phone   Notes  °[place] [facility] [address] °[place], [place] [address] (336) 641-8030 --Must be a resident of [place] °-- Must have NO insurance coverage whatsoever (no Medicaid/ Medicare, etc.) °-- The pt. MUST have a primary care doctor that directs their care regularly and follows them in the community °  °[facility]  (866) 331-1348   °[employer]  (888) 892-1162   ° °Agencies that provide inexpensive medical care: °Organization         Address  Phone   Notes  °[facility]  (336) 832-8035   °[facility]    (336) 832-7272   °[facility] [address] °[place],  [address] (336) 832-4777   °[facility] [address], °[place] (336) 271-4999   °[facility]    (336) 373-0678   °[facility]    (336) 272-1050   °[facility] ° [address], [place] Phone:  (336) 832-4444, Fax:  (336) 832-4440 Hours of Operation:  9 am - 6 pm, M-F.  Also accepts Medicaid/Medicare and self-pay.  °[facility] ° [address], [place] Phone: (336) 832-3150, Fax: (336) 832-3151. Hours of Operation:  8:30 am - 5:30 pm, M-F.  Also accepts Medicaid and self-pay.  °[facility] [place] [address], [place] Phone: (336) 878-6027   °[facility] [address], [place], [place] (336)723-1848, Ext. 123 Mondays & Thursdays: 7-9 AM.  First 15 patients are seen on a first come, first serve basis. °  ° °Medicaid-accepting [place] Providers: ° °Organization         Address  Phone   Notes  °[facility] [address], [place] (336) 641-2100 Also accepts self-pay patients.  °[facility] [address], [place] ° (336) 856-9996   °[facility] [address], [place]  (336) 288-8857   °[facility] [address] [place] [address], [place] (336) 299-7000   °Veita Bland [address], [place]  ° (336) 373-1557 Only accepts [place] Access Medicaid patients after they have their name applied to their card.  ° °Self-Pay (no insurance) in [place]: ° °Organization         Address  Phone   Notes  °Sickle Cell Patients, [facility] [address], [place] (336) 832-1970   °[facility] [address], [place] (336) 832-4400   ° [facility] ° [address], [place] (336) 992-4800   °[facility]/Dr. Osei-Bonsu ° [address] [place] [address], [place] or [address], [place] (336) [phone] Phone number for both [place] and [place] locations is the same.  °[facility] [address], [place] (336) 299-0000   °[facility] [address] [place] [address], [place] or [address] (336) 852-7530 °(336) 878-2260   °[facility] [address], [place] (336) 350-1642, phone; (336) 294-5005, fax Sees patients 1st and 3rd Saturday of every month.  Must not qualify for public or private insurance (i.e. Medicaid, Medicare, [place] Health Choice, Veterans' Benefits) • Household income should be no more than 200% of the poverty level •The clinic cannot treat you if you are pregnant or think you are pregnant • Sexually transmitted diseases are not treated at the clinic.  ° ° °Dental Care: °Organization         Address  Phone  Notes  °[place] [facility] [address], [place] (336) 641-6152 Accepts children up to age 21 who are enrolled in Medicaid or [place] Health Choice; pregnant women with a Medicaid card; and children who have applied for Medicaid or [place] Health Choice, but were declined, whose parents can pay a reduced fee at time of service.  °[place]  [facility] [place]  [address], [place] (336) 641-7733 Accepts children up to age 21 who are enrolled in Medicaid or [place] Health Choice; pregnant women with a Medicaid card; and children who have applied for Medicaid or [place] Health Choice, but were declined, whose parents can pay a reduced fee at time of service.  °[facility] ° [address],  (336) 641-4533 Patients are seen by appointment only. Walk-ins are not accepted. [facility] will see patients 18 years of age and older. °Monday - Tuesday (8am-5pm) °Most Wednesdays (8:30-5pm) °$30 per visit, cash only  °[facility] ° [address], [place] (336) 641-4533 Patients are seen by appointment only. Walk-ins are not accepted. [facility] will see patients 18 years of age and older. °One   Wednesday Evening (Monthly: Volunteer Based).  $30 per visit, cash only  °[facility]  (919) 537-3737 for adults; Children under age 4, call Graduate Pediatric Dentistry at ([phone]) [phone]. Children aged 4-14, please call (919) [phone] to request a pediatric application. ° Dental services are provided in all areas of dental care including fillings, crowns and bridges, complete and partial dentures, implants, gum treatment, root canals, and extractions. Preventive care is also provided. Treatment is provided to both adults and children. °Patients are selected via a lottery and there is often a waiting list. °  °[facility] [address], °[place] ° (336) 763-8833 [email or web address] °  °[facility] [address], [place], [place] (336)723-1848, Ext. 123 Second and Fourth Thursday of each month, opens at 6:30 AM; Clinic ends at 9 AM.  Patients are seen on a first-come first-served basis, and a limited number are seen during each clinic.  ° °[facility] ° [address], [place], [place] (336) 723-7904    Eligibility Requirements °You must have lived in [place], [place], or [place] counties for at least the last three months. °  You cannot be eligible for state or federal sponsored healthcare insurance, including Veterans Administration, Medicaid, or Medicare. °  You generally cannot be eligible for healthcare insurance through your employer.  °  How to apply: °Eligibility screenings are held every Tuesday and Wednesday afternoon from 1:00 pm until 4:00 pm. You do not need an appointment for the interview!  °[facility] [address], [place], [place] 336-631-2330   °[facility]  336-342-8273   °[facility]  336-703-3100   °[facility]  336-570-6415   ° °Behavioral Health Resources in the Community: °Intensive Outpatient Programs °Organization         Address  Phone  Notes  °[facility] [address], [place], [place] 336-878-6098   °[facility] [address], [place], [place] 336-832-9800   °[facility] [address], [place], [place] ° 336-882-2125   °[facility] [address],  °[place], [place] 1-800-853-5163 or 336-641-4981   °Substance Abuse Resources °Organization         Address  Phone  Notes  °[facility]  336-882-2125   °[facility]  336-784-9470   °[facility]  336-285-9073   °[facility]  336-845-3988   °Residential & Outpatient Substance Abuse Program  1-800-659-3381   °Psychological Services °Organization         Address  Phone  Notes  °[facility]  336- 832-9600   °[facility]  336- 378-7881   °[facility] [address], [place] 1-800-853-5163 or 336-641-4981   ° °Mobile Crisis Teams °Organization         Address  Phone  Notes  °[facility], Mobile Crisis Care Unit  1-877-626-1772   °Assertive °[facility] ° [address]  [place], [place] 336-834-9664   °Sharon DeEsch [address] °[place] 336-554-5454   ° °Self-Help/Support Groups °Organization         Address  Phone             Notes  °[facility]  - variety of support groups  336- 373-1402 Call for more information  °Narcotics Anonymous (NA), [facility] [address], °[place]   2 meetings at this location  ° °  Residential Treatment Programs Organization         Address  Phone  Notes  [facility] [address],    [place]  1-(262)637-7737   [facility]  [address] [place] [address], [place], [place]   [place], [place] (769)159-4797 Admissions: 8am-3pm M-F  [facility] [place] [address]    [place], [place] 811-572-6203   [facility] [address] [place], [place], [place]   [facility] [address],  [place], [place]   [facility] Intensive Outpatient [address], [place], [place]   [facility] [address]  [place], [place] 1-650-203-7191 or 337-773-5270   [facility] [address], [place], [place] Accepts Medicaid  [facility] [address],  [place] 1-732-666-7147 Substance Abuse/Addiction Treatment   [facility] Organization         Address  Phone  Notes  [facility]  570-025-7962   Domenic Schwab, PhD [address] [place], [place]   (684) 523-7898 or 731-328-8097   [facility] [place], [place] (503)213-6032   [facility] [address], [place], [place] 706-148-4031 Insurance/Medicaid/sponsorship through [facility] [address] [place]                                    [place], [place] 207-249-6900 [place] [address], [place] [phone]    Dr. Adele Schilder  430-674-2628   [facility] Dept. 1) [address], [place] 2) [place] 3)  [address], [place] [phone] [phone]  863-645-8562   [place] 562-311-5683 or [phone] (After Hours)      Take the prescriptions as directed.  Use your albuterol inhaler (2 to 4 puffs) every 4 hours for the next 7 days, then as needed for cough, wheezing, or shortness of breath. Take over the counter tylenol, as directed on packaging, as needed for discomfort. Elevated your hand as much as possible.  Apply moist heat or ice to the area(s) of discomfort, for 15 minutes at a time, several times per day for the next few days.  Do not fall asleep on a heating or ice pack.  Call your regular medical doctor today to schedule a follow up appointment within the next 3 days.  Return to the Emergency Department immediately sooner if worsening.

## 2015-02-15 ENCOUNTER — Encounter (HOSPITAL_COMMUNITY): Payer: Medicare Other | Admitting: Anesthesiology

## 2015-02-15 ENCOUNTER — Encounter (HOSPITAL_COMMUNITY): Payer: Self-pay | Admitting: Anesthesiology

## 2015-02-15 ENCOUNTER — Emergency Department (HOSPITAL_COMMUNITY): Payer: Medicare Other

## 2015-02-15 ENCOUNTER — Inpatient Hospital Stay (HOSPITAL_COMMUNITY)
Admission: EM | Admit: 2015-02-15 | Discharge: 2015-03-26 | DRG: 207 | Disposition: E | Payer: Medicare Other | Attending: General Surgery | Admitting: General Surgery

## 2015-02-15 ENCOUNTER — Encounter (HOSPITAL_COMMUNITY): Payer: Self-pay | Admitting: *Deleted

## 2015-02-15 DIAGNOSIS — R0602 Shortness of breath: Secondary | ICD-10-CM

## 2015-02-15 DIAGNOSIS — S2239XA Fracture of one rib, unspecified side, initial encounter for closed fracture: Secondary | ICD-10-CM

## 2015-02-15 DIAGNOSIS — D62 Acute posthemorrhagic anemia: Secondary | ICD-10-CM | POA: Diagnosis present

## 2015-02-15 DIAGNOSIS — Z79899 Other long term (current) drug therapy: Secondary | ICD-10-CM

## 2015-02-15 DIAGNOSIS — I440 Atrioventricular block, first degree: Secondary | ICD-10-CM | POA: Diagnosis present

## 2015-02-15 DIAGNOSIS — S2329XA Dislocation of other parts of thorax, initial encounter: Secondary | ICD-10-CM

## 2015-02-15 DIAGNOSIS — W309XXA Contact with unspecified agricultural machinery, initial encounter: Secondary | ICD-10-CM

## 2015-02-15 DIAGNOSIS — S43204A Unspecified dislocation of right sternoclavicular joint, initial encounter: Principal | ICD-10-CM | POA: Diagnosis present

## 2015-02-15 DIAGNOSIS — I480 Paroxysmal atrial fibrillation: Secondary | ICD-10-CM | POA: Diagnosis not present

## 2015-02-15 DIAGNOSIS — G9341 Metabolic encephalopathy: Secondary | ICD-10-CM | POA: Diagnosis present

## 2015-02-15 DIAGNOSIS — Z7951 Long term (current) use of inhaled steroids: Secondary | ICD-10-CM

## 2015-02-15 DIAGNOSIS — C3491 Malignant neoplasm of unspecified part of right bronchus or lung: Secondary | ICD-10-CM | POA: Diagnosis present

## 2015-02-15 DIAGNOSIS — Z87891 Personal history of nicotine dependence: Secondary | ICD-10-CM

## 2015-02-15 DIAGNOSIS — Z794 Long term (current) use of insulin: Secondary | ICD-10-CM

## 2015-02-15 DIAGNOSIS — J80 Acute respiratory distress syndrome: Secondary | ICD-10-CM | POA: Diagnosis present

## 2015-02-15 DIAGNOSIS — W3089XA Contact with other specified agricultural machinery, initial encounter: Secondary | ICD-10-CM

## 2015-02-15 DIAGNOSIS — N179 Acute kidney failure, unspecified: Secondary | ICD-10-CM | POA: Diagnosis present

## 2015-02-15 DIAGNOSIS — R040 Epistaxis: Secondary | ICD-10-CM | POA: Diagnosis not present

## 2015-02-15 DIAGNOSIS — E872 Acidosis: Secondary | ICD-10-CM | POA: Diagnosis present

## 2015-02-15 DIAGNOSIS — R918 Other nonspecific abnormal finding of lung field: Secondary | ICD-10-CM | POA: Diagnosis present

## 2015-02-15 DIAGNOSIS — Z452 Encounter for adjustment and management of vascular access device: Secondary | ICD-10-CM

## 2015-02-15 DIAGNOSIS — Z66 Do not resuscitate: Secondary | ICD-10-CM | POA: Diagnosis not present

## 2015-02-15 DIAGNOSIS — Z9911 Dependence on respirator [ventilator] status: Secondary | ICD-10-CM

## 2015-02-15 DIAGNOSIS — J942 Hemothorax: Secondary | ICD-10-CM | POA: Diagnosis present

## 2015-02-15 DIAGNOSIS — S2232XS Fracture of one rib, left side, sequela: Secondary | ICD-10-CM

## 2015-02-15 DIAGNOSIS — S2242XA Multiple fractures of ribs, left side, initial encounter for closed fracture: Secondary | ICD-10-CM | POA: Diagnosis present

## 2015-02-15 DIAGNOSIS — S022XXA Fracture of nasal bones, initial encounter for closed fracture: Secondary | ICD-10-CM | POA: Diagnosis present

## 2015-02-15 DIAGNOSIS — J9600 Acute respiratory failure, unspecified whether with hypoxia or hypercapnia: Secondary | ICD-10-CM | POA: Diagnosis present

## 2015-02-15 DIAGNOSIS — Z923 Personal history of irradiation: Secondary | ICD-10-CM

## 2015-02-15 DIAGNOSIS — R34 Anuria and oliguria: Secondary | ICD-10-CM | POA: Diagnosis present

## 2015-02-15 DIAGNOSIS — J156 Pneumonia due to other aerobic Gram-negative bacteria: Secondary | ICD-10-CM | POA: Diagnosis not present

## 2015-02-15 DIAGNOSIS — T1490XA Injury, unspecified, initial encounter: Secondary | ICD-10-CM

## 2015-02-15 DIAGNOSIS — E119 Type 2 diabetes mellitus without complications: Secondary | ICD-10-CM | POA: Diagnosis present

## 2015-02-15 DIAGNOSIS — S0292XA Unspecified fracture of facial bones, initial encounter for closed fracture: Secondary | ICD-10-CM | POA: Diagnosis present

## 2015-02-15 DIAGNOSIS — J9690 Respiratory failure, unspecified, unspecified whether with hypoxia or hypercapnia: Secondary | ICD-10-CM

## 2015-02-15 DIAGNOSIS — J189 Pneumonia, unspecified organism: Secondary | ICD-10-CM | POA: Diagnosis present

## 2015-02-15 DIAGNOSIS — S023XXA Fracture of orbital floor, initial encounter for closed fracture: Secondary | ICD-10-CM | POA: Diagnosis present

## 2015-02-15 DIAGNOSIS — R001 Bradycardia, unspecified: Secondary | ICD-10-CM

## 2015-02-15 DIAGNOSIS — J449 Chronic obstructive pulmonary disease, unspecified: Secondary | ICD-10-CM | POA: Diagnosis present

## 2015-02-15 DIAGNOSIS — E870 Hyperosmolality and hypernatremia: Secondary | ICD-10-CM | POA: Diagnosis present

## 2015-02-15 DIAGNOSIS — I469 Cardiac arrest, cause unspecified: Secondary | ICD-10-CM | POA: Diagnosis not present

## 2015-02-15 DIAGNOSIS — S2243XA Multiple fractures of ribs, bilateral, initial encounter for closed fracture: Secondary | ICD-10-CM | POA: Diagnosis present

## 2015-02-15 DIAGNOSIS — S43101A Unspecified dislocation of right acromioclavicular joint, initial encounter: Secondary | ICD-10-CM

## 2015-02-15 DIAGNOSIS — S2249XA Multiple fractures of ribs, unspecified side, initial encounter for closed fracture: Secondary | ICD-10-CM

## 2015-02-15 DIAGNOSIS — I495 Sick sinus syndrome: Secondary | ICD-10-CM | POA: Diagnosis present

## 2015-02-15 HISTORY — DX: Type 2 diabetes mellitus without complications: E11.9

## 2015-02-15 LAB — CBC WITH DIFFERENTIAL/PLATELET
BASOS ABS: 0 10*3/uL (ref 0.0–0.1)
Basophils Relative: 0 % (ref 0–1)
Eosinophils Absolute: 0.1 10*3/uL (ref 0.0–0.7)
Eosinophils Relative: 1 % (ref 0–5)
HEMATOCRIT: 39.4 % (ref 39.0–52.0)
Hemoglobin: 12.2 g/dL — ABNORMAL LOW (ref 13.0–17.0)
LYMPHS PCT: 10 % — AB (ref 12–46)
Lymphs Abs: 1.3 10*3/uL (ref 0.7–4.0)
MCH: 28.6 pg (ref 26.0–34.0)
MCHC: 31 g/dL (ref 30.0–36.0)
MCV: 92.3 fL (ref 78.0–100.0)
Monocytes Absolute: 0.8 10*3/uL (ref 0.1–1.0)
Monocytes Relative: 6 % (ref 3–12)
NEUTROS ABS: 10.9 10*3/uL — AB (ref 1.7–7.7)
NEUTROS PCT: 82 % — AB (ref 43–77)
PLATELETS: 149 10*3/uL — AB (ref 150–400)
RBC: 4.27 MIL/uL (ref 4.22–5.81)
RDW: 14.1 % (ref 11.5–15.5)
WBC: 13.2 10*3/uL — AB (ref 4.0–10.5)

## 2015-02-15 LAB — COMPREHENSIVE METABOLIC PANEL
ALBUMIN: 3.6 g/dL (ref 3.5–5.2)
ALK PHOS: 81 U/L (ref 39–117)
ALT: 20 U/L (ref 0–53)
AST: 30 U/L (ref 0–37)
Anion gap: 6 (ref 5–15)
BUN: 21 mg/dL (ref 6–23)
CO2: 28 mmol/L (ref 19–32)
CREATININE: 1.56 mg/dL — AB (ref 0.50–1.35)
Calcium: 9.2 mg/dL (ref 8.4–10.5)
Chloride: 105 mmol/L (ref 96–112)
GFR calc Af Amer: 46 mL/min — ABNORMAL LOW (ref >90)
GFR calc non Af Amer: 40 mL/min — ABNORMAL LOW (ref >90)
Glucose, Bld: 106 mg/dL — ABNORMAL HIGH (ref 70–99)
Potassium: 4.5 mmol/L (ref 3.5–5.1)
Sodium: 139 mmol/L (ref 135–145)
TOTAL PROTEIN: 6.9 g/dL (ref 6.0–8.3)
Total Bilirubin: 0.8 mg/dL (ref 0.3–1.2)

## 2015-02-15 LAB — CBC
HCT: 43.1 % (ref 39.0–52.0)
HEMOGLOBIN: 13.6 g/dL (ref 13.0–17.0)
MCH: 29.1 pg (ref 26.0–34.0)
MCHC: 31.6 g/dL (ref 30.0–36.0)
MCV: 92.3 fL (ref 78.0–100.0)
PLATELETS: 210 10*3/uL (ref 150–400)
RBC: 4.67 MIL/uL (ref 4.22–5.81)
RDW: 14.1 % (ref 11.5–15.5)
WBC: 13.8 10*3/uL — ABNORMAL HIGH (ref 4.0–10.5)

## 2015-02-15 LAB — BLOOD GAS, ARTERIAL
Acid-base deficit: 3.1 mmol/L — ABNORMAL HIGH (ref 0.0–2.0)
Bicarbonate: 22.5 mEq/L (ref 20.0–24.0)
DRAWN BY: 347621
FIO2: 0.4 %
MECHVT: 570 mL
O2 Saturation: 98.5 %
PATIENT TEMPERATURE: 98.6
PCO2 ART: 48.1 mmHg — AB (ref 35.0–45.0)
PEEP: 5 cmH2O
PO2 ART: 145 mmHg — AB (ref 80.0–100.0)
RATE: 15 resp/min
TCO2: 24 mmol/L (ref 0–100)
pH, Arterial: 7.291 — ABNORMAL LOW (ref 7.350–7.450)

## 2015-02-15 LAB — TYPE AND SCREEN
ABO/RH(D): B POS
Antibody Screen: NEGATIVE
Unit division: 0
Unit division: 0

## 2015-02-15 LAB — URINALYSIS, ROUTINE W REFLEX MICROSCOPIC
Bilirubin Urine: NEGATIVE
Glucose, UA: NEGATIVE mg/dL
HGB URINE DIPSTICK: NEGATIVE
KETONES UR: NEGATIVE mg/dL
Leukocytes, UA: NEGATIVE
NITRITE: NEGATIVE
Protein, ur: NEGATIVE mg/dL
Specific Gravity, Urine: 1.038 — ABNORMAL HIGH (ref 1.005–1.030)
UROBILINOGEN UA: 0.2 mg/dL (ref 0.0–1.0)
pH: 5 (ref 5.0–8.0)

## 2015-02-15 LAB — PROTIME-INR
INR: 1.04 (ref 0.00–1.49)
Prothrombin Time: 13.7 seconds (ref 11.6–15.2)

## 2015-02-15 LAB — PREPARE FRESH FROZEN PLASMA
UNIT DIVISION: 0
Unit division: 0

## 2015-02-15 LAB — ETHANOL: Alcohol, Ethyl (B): <5 mg/dL (ref 0–9)

## 2015-02-15 LAB — TRIGLYCERIDES: Triglycerides: 84 mg/dL (ref <150)

## 2015-02-15 LAB — CDS SEROLOGY

## 2015-02-15 LAB — ABO/RH: ABO/RH(D): B POS

## 2015-02-15 MED ORDER — DOCUSATE SODIUM 100 MG PO CAPS
100.0000 mg | ORAL_CAPSULE | Freq: Two times a day (BID) | ORAL | Status: DC
  Filled 2015-02-15 (×4): qty 1

## 2015-02-15 MED ORDER — FENTANYL CITRATE 0.05 MG/ML IJ SOLN
INTRAMUSCULAR | Status: AC | PRN
  Administered 2015-02-15 (×4): 50 ug via INTRAVENOUS

## 2015-02-15 MED ORDER — ROCURONIUM BROMIDE 50 MG/5ML IV SOLN
INTRAVENOUS | Status: AC
  Filled 2015-02-15: qty 2

## 2015-02-15 MED ORDER — ETOMIDATE 2 MG/ML IV SOLN
INTRAVENOUS | Status: AC | PRN
  Administered 2015-02-15: 20 mg via INTRAVENOUS

## 2015-02-15 MED ORDER — FENTANYL CITRATE 0.05 MG/ML IJ SOLN
INTRAMUSCULAR | Status: AC
  Filled 2015-02-15: qty 2

## 2015-02-15 MED ORDER — FENTANYL CITRATE 0.05 MG/ML IJ SOLN: 50.0000 ug | Freq: Once | INTRAMUSCULAR | Status: AC

## 2015-02-15 MED ORDER — SODIUM CHLORIDE 0.9 % IV SOLN
25.0000 ug/h | INTRAVENOUS | Status: DC
  Administered 2015-02-15: 25 ug/h via INTRAVENOUS
  Administered 2015-02-16: 100 ug/h via INTRAVENOUS
  Administered 2015-02-17: 120 ug/h via INTRAVENOUS
  Administered 2015-02-18: 155 ug/h via INTRAVENOUS
  Administered 2015-02-18: 120 ug/h via INTRAVENOUS
  Administered 2015-02-19: 200 ug/h via INTRAVENOUS
  Administered 2015-02-20: 140 ug/h via INTRAVENOUS
  Administered 2015-02-20: 200 ug/h via INTRAVENOUS
  Administered 2015-02-21: 160 ug/h via INTRAVENOUS
  Administered 2015-02-22: 300 ug/h via INTRAVENOUS
  Administered 2015-02-22: 200 ug/h via INTRAVENOUS
  Administered 2015-02-23: 400 ug/h via INTRAVENOUS
  Administered 2015-02-23 (×2): 250 ug/h via INTRAVENOUS
  Filled 2015-02-15 (×16): qty 50

## 2015-02-15 MED ORDER — SUCCINYLCHOLINE CHLORIDE 20 MG/ML IJ SOLN
INTRAMUSCULAR | Status: AC
  Filled 2015-02-15: qty 1

## 2015-02-15 MED ORDER — POTASSIUM CHLORIDE IN NACL 20-0.9 MEQ/L-% IV SOLN
INTRAVENOUS | Status: DC
  Administered 2015-02-15 – 2015-02-16 (×3): via INTRAVENOUS
  Filled 2015-02-15 (×9): qty 1000

## 2015-02-15 MED ORDER — FENTANYL BOLUS VIA INFUSION
25.0000 ug | INTRAVENOUS | Status: DC | PRN
Start: 2015-02-15 — End: 2015-02-20
  Filled 2015-02-15: qty 25

## 2015-02-15 MED ORDER — SUCCINYLCHOLINE CHLORIDE 20 MG/ML IJ SOLN
INTRAMUSCULAR | Status: AC | PRN
  Administered 2015-02-15: 150 mg via INTRAVENOUS

## 2015-02-15 MED ORDER — CEFAZOLIN SODIUM 1-5 GM-% IV SOLN
1.0000 g | Freq: Three times a day (TID) | INTRAVENOUS | Status: DC
  Administered 2015-02-15 – 2015-02-16 (×2): 1 g via INTRAVENOUS
  Filled 2015-02-15 (×3): qty 50

## 2015-02-15 MED ORDER — MIDAZOLAM HCL 2 MG/2ML IJ SOLN
INTRAMUSCULAR | Status: AC
  Filled 2015-02-15: qty 10

## 2015-02-15 MED ORDER — CHLORHEXIDINE GLUCONATE 0.12 % MT SOLN
15.0000 mL | Freq: Two times a day (BID) | OROMUCOSAL | Status: DC
Start: 2015-02-15 — End: 2015-02-24
  Administered 2015-02-15 – 2015-02-23 (×17): 15 mL via OROMUCOSAL
  Filled 2015-02-15 (×19): qty 15

## 2015-02-15 MED ORDER — BISACODYL 10 MG RE SUPP: 10.0000 mg | Freq: Every day | RECTAL | Status: DC | PRN

## 2015-02-15 MED ORDER — HYDROCODONE-ACETAMINOPHEN 5-325 MG PO TABS: 0.5000 | ORAL_TABLET | ORAL | Status: DC | PRN

## 2015-02-15 MED ORDER — FENTANYL CITRATE 0.05 MG/ML IJ SOLN: 25.0000 ug | INTRAMUSCULAR | Status: DC | PRN

## 2015-02-15 MED ORDER — SODIUM CHLORIDE 0.9 % IV SOLN
Freq: Once | INTRAVENOUS | Status: AC
  Administered 2015-02-15: 15:00:00 via INTRAVENOUS

## 2015-02-15 MED ORDER — ONDANSETRON HCL 4 MG/2ML IJ SOLN
4.0000 mg | Freq: Four times a day (QID) | INTRAMUSCULAR | Status: DC | PRN
Start: 2015-02-15 — End: 2015-02-24
  Administered 2015-02-22: 4 mg via INTRAVENOUS
  Filled 2015-02-15: qty 2

## 2015-02-15 MED ORDER — IOHEXOL 300 MG/ML  SOLN
80.0000 mL | Freq: Once | INTRAMUSCULAR | Status: AC | PRN
  Administered 2015-02-15: 80 mL via INTRAVENOUS

## 2015-02-15 MED ORDER — MIDAZOLAM HCL 2 MG/2ML IJ SOLN
INTRAMUSCULAR | Status: AC
  Filled 2015-02-15: qty 4

## 2015-02-15 MED ORDER — CETYLPYRIDINIUM CHLORIDE 0.05 % MT LIQD
7.0000 mL | Freq: Four times a day (QID) | OROMUCOSAL | Status: DC
  Administered 2015-02-16 – 2015-02-24 (×34): 7 mL via OROMUCOSAL

## 2015-02-15 MED ORDER — INSULIN ASPART 100 UNIT/ML ~~LOC~~ SOLN: 0.0000 [IU] | Freq: Three times a day (TID) | SUBCUTANEOUS | Status: DC

## 2015-02-15 MED ORDER — PANTOPRAZOLE SODIUM 40 MG PO TBEC: 40.0000 mg | DELAYED_RELEASE_TABLET | Freq: Every day | ORAL | Status: DC

## 2015-02-15 MED ORDER — PROPOFOL 10 MG/ML IV EMUL
INTRAVENOUS | Status: AC
  Filled 2015-02-15: qty 100

## 2015-02-15 MED ORDER — ETOMIDATE 2 MG/ML IV SOLN
INTRAVENOUS | Status: AC
  Filled 2015-02-15: qty 20

## 2015-02-15 MED ORDER — LIDOCAINE HCL (CARDIAC) 20 MG/ML IV SOLN
INTRAVENOUS | Status: AC
  Filled 2015-02-15: qty 5

## 2015-02-15 MED ORDER — PANTOPRAZOLE SODIUM 40 MG IV SOLR
40.0000 mg | Freq: Every day | INTRAVENOUS | Status: DC
  Administered 2015-02-15 – 2015-02-23 (×9): 40 mg via INTRAVENOUS
  Filled 2015-02-15 (×11): qty 40

## 2015-02-15 MED ORDER — ONDANSETRON HCL 4 MG PO TABS: 4.0000 mg | ORAL_TABLET | Freq: Four times a day (QID) | ORAL | Status: DC | PRN

## 2015-02-15 MED ORDER — SODIUM CHLORIDE 0.9 % IV SOLN
INTRAVENOUS | Status: DC | PRN
  Administered 2015-02-15: 1000 mL via INTRAVENOUS

## 2015-02-15 MED ORDER — MIDAZOLAM HCL 5 MG/5ML IJ SOLN
INTRAMUSCULAR | Status: DC | PRN
  Administered 2015-02-15: 4 mg via INTRAVENOUS
  Administered 2015-02-15: 2 mg via INTRAVENOUS

## 2015-02-15 MED ORDER — PROPOFOL 10 MG/ML IV EMUL
0.0000 ug/kg/min | INTRAVENOUS | Status: DC
  Administered 2015-02-15: 10 ug/kg/min via INTRAVENOUS
  Administered 2015-02-15: 25 ug/kg/min via INTRAVENOUS
  Administered 2015-02-16 – 2015-02-19 (×6): 15 ug/kg/min via INTRAVENOUS
  Filled 2015-02-15 (×8): qty 100
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
## 2015-02-15 NOTE — ED Notes (Signed)
Preparing for intubation due to pts inability to breath while laying flat per Dr. Hulen Skains.
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
## 2015-02-15 NOTE — ED Notes (Signed)
Dr Stark Jock Attempting intubation at this time
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
## 2015-02-15 NOTE — ED Notes (Signed)
Pt continues to fight, opening eyes and trying to sit up.
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
## 2015-02-15 NOTE — Consult Note (Addendum)
Reason for Consult: Sternoclavicular  Dislocation right Referring Physician: Dr Marilynn Rail  Nash Mantis is an 81 y.o. male.  HPI: Timoteo is an 81 year old active patient who fell off his tractor earlier today. The traction and ran over him. He is currently intubated and cannot give anymore history. This history was obtained in talking with his nurse. Patient has been found to have multiple injuries and medical issues. One of these is an anterior sternoclavicular dislocation on the right-hand side tightness by CT scan. Thoracic surgery has also been consulted to evaluate potential carcinoma in the lung.  Past Medical History  Diagnosis Date  . COPD (chronic obstructive pulmonary disease)   . Diabetes mellitus     No past surgical history on file.  No family history on file.  Social History:  has no tobacco, alcohol, and drug history on file.  Allergies: No Known Allergies  Medications: I have reviewed the patient's current medications.  Results for orders placed or performed during the hospital encounter of 02/07/2015 (from the past 48 hour(s))  Prepare fresh frozen plasma     Status: None   Collection Time: 02/11/2015  1:04 PM  Result Value Ref Range   Unit Number [id number]    Blood Component Type LIQ PLASMA    Unit division 00    Status of Unit REL FROM [facility]    Unit tag comment VERBAL ORDERS PER DR DELO    Transfusion Status OK TO TRANSFUSE    Unit Number [id number]    Blood Component Type LIQ PLASMA    Unit division 00    Status of Unit REL FROM [facility]    Unit tag comment VERBAL ORDERS PER DR DELO    Transfusion Status OK TO TRANSFUSE   Triglycerides     Status: None   Collection Time: 01/24/2015  1:22 PM  Result Value Ref Range   Triglycerides 84 <150 mg/dL  Type and screen     Status: None   Collection Time: 02/08/2015  1:26 PM  Result Value Ref Range   ABO/RH(D) B POS    Antibody Screen NEG    Sample Expiration 02/18/2015    Unit Number [id number]    Blood  Component Type RBC LR PHER2    Unit division 00    Status of Unit REL FROM [facility]    Unit tag comment VERBAL ORDERS PER DR DELO    Transfusion Status OK TO TRANSFUSE    Crossmatch Result COMPATIBLE    Unit Number [id number]    Blood Component Type RED CELLS,LR    Unit division 00    Status of Unit REL FROM [facility]    Unit tag comment VERBAL ORDERS PER DR DELO    Transfusion Status OK TO TRANSFUSE    Crossmatch Result COMPATIBLE   CDS serology     Status: None   Collection Time: 02/10/2015  1:26 PM  Result Value Ref Range   CDS serology specimen      SPECIMEN WILL BE HELD FOR 14 DAYS IF TESTING IS REQUIRED  Comprehensive metabolic panel     Status: Abnormal   Collection Time: 02/06/2015  1:26 PM  Result Value Ref Range   Sodium 139 135 - 145 mmol/L   Potassium 4.5 3.5 - 5.1 mmol/L   Chloride 105 96 - 112 mmol/L   CO2 28 19 - 32 mmol/L   Glucose, Bld 106 (H) 70 - 99 mg/dL   BUN 21 6 - 23 mg/dL   Creatinine, Ser 1.56 (H) 0.50 -  1.35 mg/dL   Calcium 9.2 8.4 - 10.5 mg/dL   Total Protein 6.9 6.0 - 8.3 g/dL   Albumin 3.6 3.5 - 5.2 g/dL   AST 30 0 - 37 U/L   ALT 20 0 - 53 U/L   Alkaline Phosphatase 81 39 - 117 U/L   Total Bilirubin 0.8 0.3 - 1.2 mg/dL   GFR calc non Af Amer 40 (L) >90 mL/min   GFR calc Af Amer 46 (L) >90 mL/min    Comment: (NOTE) The eGFR has been calculated using the CKD EPI equation. This calculation has not been validated in all clinical situations. eGFR's persistently <90 mL/min signify possible Chronic Kidney Disease.    Anion gap 6 5 - 15  CBC     Status: Abnormal   Collection Time: 02/06/2015  1:26 PM  Result Value Ref Range   WBC 13.8 (H) 4.0 - 10.5 K/uL   RBC 4.67 4.22 - 5.81 MIL/uL   Hemoglobin 13.6 13.0 - 17.0 g/dL   HCT 43.1 39.0 - 52.0 %   MCV 92.3 78.0 - 100.0 fL   MCH 29.1 26.0 - 34.0 pg   MCHC 31.6 30.0 - 36.0 g/dL   RDW 14.1 11.5 - 15.5 %   Platelets 210 150 - 400 K/uL  Ethanol     Status: None   Collection Time: 01/27/2015  1:26 PM  Result  Value Ref Range   Alcohol, Ethyl (B) <5 0 - 9 mg/dL    Comment:        LOWEST DETECTABLE LIMIT FOR SERUM ALCOHOL IS 11 mg/dL FOR MEDICAL PURPOSES ONLY   Protime-INR     Status: None   Collection Time: 02/06/2015  1:26 PM  Result Value Ref Range   Prothrombin Time 13.7 11.6 - 15.2 seconds   INR 1.04 0.00 - 1.49  ABO/Rh     Status: None   Collection Time: 02/10/2015  1:26 PM  Result Value Ref Range   ABO/RH(D) B POS   CBC with Differential     Status: Abnormal   Collection Time: 02/23/2015  2:17 PM  Result Value Ref Range   WBC 13.2 (H) 4.0 - 10.5 K/uL   RBC 4.27 4.22 - 5.81 MIL/uL   Hemoglobin 12.2 (L) 13.0 - 17.0 g/dL   HCT 39.4 39.0 - 52.0 %   MCV 92.3 78.0 - 100.0 fL   MCH 28.6 26.0 - 34.0 pg   MCHC 31.0 30.0 - 36.0 g/dL   RDW 14.1 11.5 - 15.5 %   Platelets 149 (L) 150 - 400 K/uL    Comment: REPEATED TO VERIFY   Neutrophils Relative % 82 (H) 43 - 77 %   Neutro Abs 10.9 (H) 1.7 - 7.7 K/uL   Lymphocytes Relative 10 (L) 12 - 46 %   Lymphs Abs 1.3 0.7 - 4.0 K/uL   Monocytes Relative 6 3 - 12 %   Monocytes Absolute 0.8 0.1 - 1.0 K/uL   Eosinophils Relative 1 0 - 5 %   Eosinophils Absolute 0.1 0.0 - 0.7 K/uL   Basophils Relative 0 0 - 1 %   Basophils Absolute 0.0 0.0 - 0.1 K/uL  Urinalysis, Routine w reflex microscopic     Status: Abnormal   Collection Time: 02/04/2015  5:05 PM  Result Value Ref Range   Color, Urine YELLOW YELLOW   APPearance CLEAR CLEAR   Specific Gravity, Urine 1.038 (H) 1.005 - 1.030   pH 5.0 5.0 - 8.0   Glucose, UA NEGATIVE NEGATIVE mg/dL  Hgb urine dipstick NEGATIVE NEGATIVE   Bilirubin Urine NEGATIVE NEGATIVE   Ketones, ur NEGATIVE NEGATIVE mg/dL   Protein, ur NEGATIVE NEGATIVE mg/dL   Urobilinogen, UA 0.2 0.0 - 1.0 mg/dL   Nitrite NEGATIVE NEGATIVE   Leukocytes, UA NEGATIVE NEGATIVE    Comment: MICROSCOPIC NOT DONE ON URINES WITH NEGATIVE PROTEIN, BLOOD, LEUKOCYTES, NITRITE, OR GLUCOSE <1000 mg/dL.    Ct Head Wo Contrast  02/17/2015   CLINICAL  DATA:  81 year old male run over by tractor. Initial encounter.  EXAM: CT HEAD WITHOUT CONTRAST  CT MAXILLOFACIAL WITHOUT CONTRAST  CT CERVICAL SPINE WITHOUT CONTRAST  TECHNIQUE: Multidetector CT imaging of the head, cervical spine, and maxillofacial structures were performed using the standard protocol without intravenous contrast. Multiplanar CT image reconstructions of the cervical spine and maxillofacial structures were also generated.  COMPARISON:  None.  FINDINGS: CT HEAD FINDINGS  No skull fracture or intracranial hemorrhage.  Orbital/facial injuries as discussed below.  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 No CT evidence of large acute infarct.  Small vessel disease type changes. No CT evidence of large acute infarct.  No intracranial mass lesion noted on this unenhanced exam.  No hydrocephalus.  CT MAXILLOFACIAL FINDINGS  Right inferior orbital floor fracture with depression of fracture fragment into the maxillary sinus and herniation of fat into this region without entrapment of the inferior rectus muscle. Blood and fluid within the right maxillary sinus.  Fracture of the nasal spine. Lucency of the anterior maxilla is well corticated and appears congenital rather than a fracture.  Right orbital preseptal soft tissue swelling consistent with hematoma. Underlying globe appears be grossly intact.  Prominent soft tissue swelling/hematoma nasal region greater to the right of midline with associated soft tissue injury extending to the gabella. Within the soft tissue swelling/hematoma, radiopaque material is noted some of which appears separate from the nasal spine fracture fragments and may represent radiopaque foreign bodies.  Prominent caries.  Patient is intubated.  CT CERVICAL SPINE FINDINGS  No cervical spine fracture noted.  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 Degenerative changes at C1-2  articulation.  Fusion C3-4 and and T1-2.  Cervical spondylotic changes C3-4 thru C6-7 with various degrees of spinal stenosis and foraminal narrowing.  Transverse ligament hypertrophy.  If ligamentous injury or cord injury were of high clinical concern, MR imaging could be obtained for further delineation.  Left-sided rib fractures.  Anterior dislocated distal clavicle with interruption of the sternoclavicular reticulation and associated hemorrhage.  Findings suggestive of right-sided lung cancer possibly post radiation therapy. Clinical correlation recommended.  IMPRESSION: CT HEAD  No skull fracture or intracranial hemorrhage.  Orbital/facial injuries as discussed below.  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 No CT evidence of large acute infarct.  Small vessel disease type changes. No CT evidence of large acute infarct.  CT MAXILLOFACIAL  Right inferior orbital floor fracture with depression of fracture fragment into the maxillary sinus and herniation of fat into this region without entrapment of the inferior rectus muscle. Blood and fluid within the right maxillary sinus.  Fracture of the nasal spine. Lucency of the anterior maxilla is well corticated and appears congenital rather than a fracture.  Right orbital preseptal soft tissue swelling consistent with hematoma. Underlying globe appears be grossly intact.  Prominent soft tissue swelling/hematoma nasal region greater to the right of midline with associated soft tissue injury extending to the gabella. Within the  soft tissue swelling/hematoma, radiopaque material is noted some of which appears separate from the nasal spine fracture fragments and may represent radiopaque foreign bodies.  Prominent caries.  CT CERVICAL SPINE  No cervical spine fracture noted.  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  Fusion C3-4 and and T1-2.  Cervical spondylotic changes C3-4  thru C6-7 with various degrees of spinal stenosis and foraminal narrowing.  Transverse ligament hypertrophy.  If ligamentous injury or cord injury were of high clinical concern, MR imaging could be obtained for further delineation.  Left-sided rib fractures.  Anterior dislocated distal clavicle with interruption of the sternoclavicular reticulation and associated hemorrhage.  Findings suggestive of right-sided lung cancer possibly post radiation therapy. Clinical correlation recommended.  These results were reviewed with Dr. Hulen Skains at the time of interpretation on 02/03/2015 at 2:35 pm.   Electronically Signed   By: Genia Del M.D.   On: 01/24/2015 15:03   Ct Chest W Contrast  01/29/2015   CLINICAL DATA:  Level 1 trauma. Patient crushed by tractor. Hypotension.  EXAM: CT CHEST, ABDOMEN, AND PELVIS WITH CONTRAST  TECHNIQUE: Multidetector CT imaging of the chest, abdomen and pelvis was performed following the standard protocol during bolus administration of intravenous contrast.  CONTRAST:  22mL OMNIPAQUE IOHEXOL 300 MG/ML  SOLN  COMPARISON:  Radiographs today.  FINDINGS: CT CHEST FINDINGS  Musculoskeletal: Anterior dislocation of the RIGHT sternoclavicular joint is present with hemorrhage around the dislocated clavicular head. No visible clavicle fracture. Severe LEFT glenohumeral osteoarthritis is present. LEFT scapula intact. RIGHT scapula intact. Sternum is intact with sternomanubrial joint degenerative disease.  RIGHT ribs appear intact. LEFT second through ninth rib fractures are present. The more inferior rib fractures are segmental. LEFT chest wall small hematoma and soft tissue emphysema is present.  Thoracic spondylosis is present. Thoracic vertebral body height appears preserved. Segmentation anomaly is present at T1-T2 with solid fusion at this level. Spinous processes appear intact with scattered calcifications along the interspinous ligament. No thoracic spondylolisthesis.  Lungs: Severe  centrilobular emphysema is present. Dependent atelectasis in the LEFT lung with a small LEFT hemothorax. No active extravasation of contrast is identified from the intercostal arteries.  There is a RIGHT suprahilar mass with extensive cicatricial changes. The scarring extends to the anterior chest wall. This is most compatible with bronchogenic carcinoma, possibly with treatment and radiation fibrosis anteriorly. At the level of the undersurface of the aortic arch, the mass measures 5.8 cm AP x 2.6 cm transverse. Scattered micronodularity is present in the lungs, which is a nonspecific finding. Bilateral dependent atelectasis.  Central airways: Endotracheal tube appears in good position. Saber sheath trachea. Diffuse bronchial wall thickening in the RIGHT middle and RIGHT lower lobe bronchi. Partial encasement of the RIGHT upper lobe bronchi associated with suprahilar mass. LEFT-sided central airways appear patent.  Vasculature: Tortuous thoracic aorta. Aortic atherosclerosis. Coronary artery atherosclerosis is present. If office based assessment of coronary risk factors has not been performed, it is now recommended. No acute vascular injury is identified.  Effusions: Small LEFT hemo thorax.  Lymphadenopathy: No axillary adenopathy. No LEFT hilar adenopathy. No definite mediastinal adenopathy. RIGHT hilar soft tissue is confluent with the mass and may represent adenopathy or scarring.  Esophagus: Patulous upper thoracic esophagus.  Other: None.  CT ABDOMEN AND PELVIS FINDINGS  Musculoskeletal: Both hips are located. Sacroiliac joint osteoarthritis with vacuum joint. Lumbar spondylosis. No lumbar compression fractures. Severe L4-L5 and L5-S1 degenerative disc and facet disease.  Grade I anterolisthesis of L4 on L5 appears degenerative. No fracture of the lumbar spine. The pelvic rings appear intact.  Liver: Old granulomatous disease. No mass or liver laceration identified. Contrast is in the arterial phase. There is a  subtle high attenuation area near the inferior vena cava in the RIGHT hepatic dome which may represent a flash fill hemangioma but is nonspecific.  Spleen:  Intact.  Gallbladder: Cholelithiasis with dependently layering gallstones in the gallbladder neck.  Common bile duct:  Normal.  Pancreas:  Normal.  Adrenal glands:  Normal bilaterally.  Kidneys: Bilateral renal cysts are present. Some of these are intermediate attenuation. No solid enhancing lesions to suggest solid renal neoplasm.  Stomach:  Partially distended.  Small bowel: Normal. No mesenteric adenopathy. No small bowel obstruction.  Colon:   Within normal limits.  Pelvic Genitourinary: Urinary bladder appears within normal limits. Prostatomegaly with calcifications.  Peritoneum: No free air or free fluid.  Vasculature: Severe atherosclerosis. No acute vascular abnormality. Tortuous common iliac arteries bilaterally.  Body Wall: Within normal limits.  IMPRESSION: 1. Multiple LEFT-sided rib fractures with small LEFT hemothorax and LEFT chest wall hematoma with soft tissue emphysema. No pneumothorax. 2. RIGHT upper lobe mass is most compatible with bronchogenic carcinoma. Pulmonary parenchymal distortion is present with cicatricial changes suggesting prior radiation therapy for lung cancer. Correlate with history. If needed, follow-up PET-CT can be used for further assessment. 3. Anterior dislocation of the RIGHT sternoclavicular joint. 4. Severe atherosclerosis.  Coronary artery disease.  Emphysema. 5. Incidental findings in the abdomen including old granulomatous disease, cholelithiasis and renal cysts. No acute traumatic injury to the abdomen or pelvis.  Preliminary results were discussed with Dr. Hulen Skains by Dr. Jeannine Kitten at the time of interpretation of head CT.   Electronically Signed   By: Dereck Ligas M.D.   On: 02/23/2015 14:57   Ct Cervical Spine Wo Contrast  01/31/2015   CLINICAL DATA:  81 year old male run over by tractor. Initial encounter.  EXAM:  CT HEAD WITHOUT CONTRAST  CT MAXILLOFACIAL WITHOUT CONTRAST  CT CERVICAL SPINE WITHOUT CONTRAST  TECHNIQUE: Multidetector CT imaging of the head, cervical spine, and maxillofacial structures were performed using the standard protocol without intravenous contrast. Multiplanar CT image reconstructions of the cervical spine and maxillofacial structures were also generated.  COMPARISON:  None.  FINDINGS: CT HEAD FINDINGS  No skull fracture or intracranial hemorrhage.  Orbital/facial injuries as discussed below.  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 No CT evidence of large acute infarct.  Small vessel disease type changes. No CT evidence of large acute infarct.  No intracranial mass lesion noted on this unenhanced exam.  No hydrocephalus.  CT MAXILLOFACIAL FINDINGS  Right inferior orbital floor fracture with depression of fracture fragment into the maxillary sinus and herniation of fat into this region without entrapment of the inferior rectus muscle. Blood and fluid within the right maxillary sinus.  Fracture of the nasal spine. Lucency of the anterior maxilla is well corticated and appears congenital rather than a fracture.  Right orbital preseptal soft tissue swelling consistent with hematoma. Underlying globe appears be grossly intact.  Prominent soft tissue swelling/hematoma nasal region greater to the right of midline with associated soft tissue injury extending to the gabella. Within the soft tissue swelling/hematoma, radiopaque material is noted some of which appears separate from the nasal spine fracture fragments and may represent radiopaque foreign bodies.  Prominent caries.  Patient is intubated.  CT CERVICAL SPINE FINDINGS  No cervical spine  fracture noted.  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 Degenerative changes at C1-2 articulation.  Fusion C3-4 and and T1-2.  Cervical spondylotic changes C3-4 thru  C6-7 with various degrees of spinal stenosis and foraminal narrowing.  Transverse ligament hypertrophy.  If ligamentous injury or cord injury were of high clinical concern, MR imaging could be obtained for further delineation.  Left-sided rib fractures.  Anterior dislocated distal clavicle with interruption of the sternoclavicular reticulation and associated hemorrhage.  Findings suggestive of right-sided lung cancer possibly post radiation therapy. Clinical correlation recommended.  IMPRESSION: CT HEAD  No skull fracture or intracranial hemorrhage.  Orbital/facial injuries as discussed below.  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 No CT evidence of large acute infarct.  Small vessel disease type changes. No CT evidence of large acute infarct.  CT MAXILLOFACIAL  Right inferior orbital floor fracture with depression of fracture fragment into the maxillary sinus and herniation of fat into this region without entrapment of the inferior rectus muscle. Blood and fluid within the right maxillary sinus.  Fracture of the nasal spine. Lucency of the anterior maxilla is well corticated and appears congenital rather than a fracture.  Right orbital preseptal soft tissue swelling consistent with hematoma. Underlying globe appears be grossly intact.  Prominent soft tissue swelling/hematoma nasal region greater to the right of midline with associated soft tissue injury extending to the gabella. Within the soft tissue swelling/hematoma, radiopaque material is noted some of which appears separate from the nasal spine fracture fragments and may represent radiopaque foreign bodies.  Prominent caries.  CT CERVICAL SPINE  No cervical spine fracture noted.  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  Fusion C3-4 and and T1-2.  Cervical spondylotic changes C3-4 thru C6-7 with various degrees of spinal stenosis and foraminal narrowing.   Transverse ligament hypertrophy.  If ligamentous injury or cord injury were of high clinical concern, MR imaging could be obtained for further delineation.  Left-sided rib fractures.  Anterior dislocated distal clavicle with interruption of the sternoclavicular reticulation and associated hemorrhage.  Findings suggestive of right-sided lung cancer possibly post radiation therapy. Clinical correlation recommended.  These results were reviewed with Dr. Hulen Skains at the time of interpretation on 02/11/2015 at 2:35 pm.   Electronically Signed   By: Genia Del M.D.   On: 01/29/2015 15:03   Ct Abdomen Pelvis W Contrast  02/08/2015   CLINICAL DATA:  Level 1 trauma. Patient crushed by tractor. Hypotension.  EXAM: CT CHEST, ABDOMEN, AND PELVIS WITH CONTRAST  TECHNIQUE: Multidetector CT imaging of the chest, abdomen and pelvis was performed following the standard protocol during bolus administration of intravenous contrast.  CONTRAST:  14m OMNIPAQUE IOHEXOL 300 MG/ML  SOLN  COMPARISON:  Radiographs today.  FINDINGS: CT CHEST FINDINGS  Musculoskeletal: Anterior dislocation of the RIGHT sternoclavicular joint is present with hemorrhage around the dislocated clavicular head. No visible clavicle fracture. Severe LEFT glenohumeral osteoarthritis is present. LEFT scapula intact. RIGHT scapula intact. Sternum is intact with sternomanubrial joint degenerative disease.  RIGHT ribs appear intact. LEFT second through ninth rib fractures are present. The more inferior rib fractures are segmental. LEFT chest wall small hematoma and soft tissue emphysema is present.  Thoracic spondylosis is present. Thoracic vertebral body height appears preserved. Segmentation anomaly is present at T1-T2 with solid fusion at this level. Spinous processes appear intact with scattered calcifications along the interspinous ligament.  No thoracic spondylolisthesis.  Lungs: Severe centrilobular emphysema is present. Dependent atelectasis in the LEFT lung with  a small LEFT hemothorax. No active extravasation of contrast is identified from the intercostal arteries.  There is a RIGHT suprahilar mass with extensive cicatricial changes. The scarring extends to the anterior chest wall. This is most compatible with bronchogenic carcinoma, possibly with treatment and radiation fibrosis anteriorly. At the level of the undersurface of the aortic arch, the mass measures 5.8 cm AP x 2.6 cm transverse. Scattered micronodularity is present in the lungs, which is a nonspecific finding. Bilateral dependent atelectasis.  Central airways: Endotracheal tube appears in good position. Saber sheath trachea. Diffuse bronchial wall thickening in the RIGHT middle and RIGHT lower lobe bronchi. Partial encasement of the RIGHT upper lobe bronchi associated with suprahilar mass. LEFT-sided central airways appear patent.  Vasculature: Tortuous thoracic aorta. Aortic atherosclerosis. Coronary artery atherosclerosis is present. If office based assessment of coronary risk factors has not been performed, it is now recommended. No acute vascular injury is identified.  Effusions: Small LEFT hemo thorax.  Lymphadenopathy: No axillary adenopathy. No LEFT hilar adenopathy. No definite mediastinal adenopathy. RIGHT hilar soft tissue is confluent with the mass and may represent adenopathy or scarring.  Esophagus: Patulous upper thoracic esophagus.  Other: None.  CT ABDOMEN AND PELVIS FINDINGS  Musculoskeletal: Both hips are located. Sacroiliac joint osteoarthritis with vacuum joint. Lumbar spondylosis. No lumbar compression fractures. Severe L4-L5 and L5-S1 degenerative disc and facet disease. Grade I anterolisthesis of L4 on L5 appears degenerative. No fracture of the lumbar spine. The pelvic rings appear intact.  Liver: Old granulomatous disease. No mass or liver laceration identified. Contrast is in the arterial phase. There is a subtle high attenuation area near the inferior vena cava in the RIGHT hepatic  dome which may represent a flash fill hemangioma but is nonspecific.  Spleen:  Intact.  Gallbladder: Cholelithiasis with dependently layering gallstones in the gallbladder neck.  Common bile duct:  Normal.  Pancreas:  Normal.  Adrenal glands:  Normal bilaterally.  Kidneys: Bilateral renal cysts are present. Some of these are intermediate attenuation. No solid enhancing lesions to suggest solid renal neoplasm.  Stomach:  Partially distended.  Small bowel: Normal. No mesenteric adenopathy. No small bowel obstruction.  Colon:   Within normal limits.  Pelvic Genitourinary: Urinary bladder appears within normal limits. Prostatomegaly with calcifications.  Peritoneum: No free air or free fluid.  Vasculature: Severe atherosclerosis. No acute vascular abnormality. Tortuous common iliac arteries bilaterally.  Body Wall: Within normal limits.  IMPRESSION: 1. Multiple LEFT-sided rib fractures with small LEFT hemothorax and LEFT chest wall hematoma with soft tissue emphysema. No pneumothorax. 2. RIGHT upper lobe mass is most compatible with bronchogenic carcinoma. Pulmonary parenchymal distortion is present with cicatricial changes suggesting prior radiation therapy for lung cancer. Correlate with history. If needed, follow-up PET-CT can be used for further assessment. 3. Anterior dislocation of the RIGHT sternoclavicular joint. 4. Severe atherosclerosis.  Coronary artery disease.  Emphysema. 5. Incidental findings in the abdomen including old granulomatous disease, cholelithiasis and renal cysts. No acute traumatic injury to the abdomen or pelvis.  Preliminary results were discussed with Dr. Hulen Skains by Dr. Jeannine Kitten at the time of interpretation of head CT.   Electronically Signed   By: Dereck Ligas M.D.   On: 01/26/2015 14:57   Dg Pelvis Portable  01/30/2015   CLINICAL DATA:  Run over by tractor.  Initial encounter.  EXAM: PORTABLE PELVIS 1-2 VIEWS  COMPARISON:  None.  FINDINGS:  No obvious pelvic or hip fracture. Iliac bones  are incompletely visualized. The pelvis is also rotated. Soft tissues are grossly unremarkable.  IMPRESSION: No visible pelvic or hip fracture.   Electronically Signed   By: Aletta Edouard M.D.   On: 01/29/2015 14:06   Dg Chest Portable 1 View  02/22/2015   CLINICAL DATA:  Patient was run over by tractor. Left anterior chest and rib pain. Right clavicular deformity.  EXAM: PORTABLE CHEST - 1 VIEW  COMPARISON:  01/24/2015.  FINDINGS: Right rib fractures are again noted. The right clavicle is abnormally superiorly dislocated at the sternoclavicular joint. Multiple displaced left rib fractures are observed.  No pneumothorax.  Endotracheal tube tip 3 cm above the carina.  Abnormal airspace opacity in the right upper lobe medially. Emphysema. Airspace opacity at the left lung base.  IMPRESSION: 1. Endotracheal tube: 3 cm above the carina. 2. Numerous displaced left-sided rib fractures laterally. Subtle anterior right rib fractures. 3. Superior dislocation of the right clavicle at the sternoclavicular joint. 4. Atherosclerotic aortic arch and emphysema. 5. Abnormal airspace opacity in the right upper lobe and at the left lung base.   Electronically Signed   By: Van Clines M.D.   On: 02/09/2015 14:37   Dg Chest Portable 1 View  01/30/2015   CLINICAL DATA:  Patient run over by tractor.  Chest pain  EXAM: PORTABLE CHEST - 1 VIEW  COMPARISON:  None.  FINDINGS: There is right upper lobe volume loss/consolidation. Lungs elsewhere are clear. Heart size is within normal limits. Pulmonary vascularity within normal limits.  No pneumothorax is appreciated on this study. There are multiple rib fractures on the left.  IMPRESSION: Right upper lobe volume loss/consolidation. Multiple rib fractures on the left, displaced. No pneumothorax is seen on this examination, however.   Electronically Signed   By: Lowella Grip III M.D.   On: 01/27/2015 14:07   Ct Maxillofacial Wo Cm  02/20/2015   CLINICAL DATA:  81 year old  male run over by tractor. Initial encounter.  EXAM: CT HEAD WITHOUT CONTRAST  CT MAXILLOFACIAL WITHOUT CONTRAST  CT CERVICAL SPINE WITHOUT CONTRAST  TECHNIQUE: Multidetector CT imaging of the head, cervical spine, and maxillofacial structures were performed using the standard protocol without intravenous contrast. Multiplanar CT image reconstructions of the cervical spine and maxillofacial structures were also generated.  COMPARISON:  None.  FINDINGS: CT HEAD FINDINGS  No skull fracture or intracranial hemorrhage.  Orbital/facial injuries as discussed below.  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 No CT evidence of large acute infarct.  Small vessel disease type changes. No CT evidence of large acute infarct.  No intracranial mass lesion noted on this unenhanced exam.  No hydrocephalus.  CT MAXILLOFACIAL FINDINGS  Right inferior orbital floor fracture with depression of fracture fragment into the maxillary sinus and herniation of fat into this region without entrapment of the inferior rectus muscle. Blood and fluid within the right maxillary sinus.  Fracture of the nasal spine. Lucency of the anterior maxilla is well corticated and appears congenital rather than a fracture.  Right orbital preseptal soft tissue swelling consistent with hematoma. Underlying globe appears be grossly intact.  Prominent soft tissue swelling/hematoma nasal region greater to the right of midline with associated soft tissue injury extending to the gabella. Within the soft tissue swelling/hematoma, radiopaque material is noted some of which appears separate from the nasal spine fracture fragments and may represent radiopaque foreign bodies.  Prominent caries.  Patient is intubated.  CT  CERVICAL SPINE FINDINGS  No cervical spine fracture noted.  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 Degenerative changes at C1-2 articulation.  Fusion C3-4  and and T1-2.  Cervical spondylotic changes C3-4 thru C6-7 with various degrees of spinal stenosis and foraminal narrowing.  Transverse ligament hypertrophy.  If ligamentous injury or cord injury were of high clinical concern, MR imaging could be obtained for further delineation.  Left-sided rib fractures.  Anterior dislocated distal clavicle with interruption of the sternoclavicular reticulation and associated hemorrhage.  Findings suggestive of right-sided lung cancer possibly post radiation therapy. Clinical correlation recommended.  IMPRESSION: CT HEAD  No skull fracture or intracranial hemorrhage.  Orbital/facial injuries as discussed below.  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 No CT evidence of large acute infarct.  Small vessel disease type changes. No CT evidence of large acute infarct.  CT MAXILLOFACIAL  Right inferior orbital floor fracture with depression of fracture fragment into the maxillary sinus and herniation of fat into this region without entrapment of the inferior rectus muscle. Blood and fluid within the right maxillary sinus.  Fracture of the nasal spine. Lucency of the anterior maxilla is well corticated and appears congenital rather than a fracture.  Right orbital preseptal soft tissue swelling consistent with hematoma. Underlying globe appears be grossly intact.  Prominent soft tissue swelling/hematoma nasal region greater to the right of midline with associated soft tissue injury extending to the gabella. Within the soft tissue swelling/hematoma, radiopaque material is noted some of which appears separate from the nasal spine fracture fragments and may represent radiopaque foreign bodies.  Prominent caries.  CT CERVICAL SPINE  No cervical spine fracture noted.  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  Fusion C3-4 and and T1-2.  Cervical spondylotic changes C3-4 thru C6-7 with various  degrees of spinal stenosis and foraminal narrowing.  Transverse ligament hypertrophy.  If ligamentous injury or cord injury were of high clinical concern, MR imaging could be obtained for further delineation.  Left-sided rib fractures.  Anterior dislocated distal clavicle with interruption of the sternoclavicular reticulation and associated hemorrhage.  Findings suggestive of right-sided lung cancer possibly post radiation therapy. Clinical correlation recommended.  These results were reviewed with Dr. Hulen Skains at the time of interpretation on 02/10/2015 at 2:35 pm.   Electronically Signed   By: Genia Del M.D.   On: 01/30/2015 15:03    Review of Systems  Unable to perform ROS  Blood pressure 111/78, pulse 51, temperature 98.6 F (37 C), temperature source Temporal, resp. rate 19, height _0  (1.753 m), weight 68.04 kg (150 lb), SpO2 100 %. Physical Exam  Constitutional: He appears well-developed.  HENT:  Head: Normocephalic.  Cardiovascular: Normal rate.   Skin: Skin is warm.   examination of the patient's extremities demonstrates no course crying or crepitus in the ankles knees or hip. Is a small right knee effusion no left knee effusion collateral patients are stable post blood pressure somewhat low at the time however his feet are perfused. Radial pulse intact bilaterally as I will bit of coarseness with left shoulder range of motion but no swelling ecchymosis or bruising is present. Wrist and elbows without bruising or ecchymosis or crepitus with passive range of motion. He does have anterior sternal frequent dislocation on the right hand side. This is a closed injury. C-collar is in place.  Assessment/Plan: Impression is 81 year old patient  status post fall from a tractor with several injuries. These include left-sided rib fractures orbital fracture and anterior right-sided sternoclavicular dislocation. Patient also has possibly recurrent lung cancer on the right status post radiation per CT  scan report. Plan is to attempt closed reduction of the dislocation tomorrow. Could be coordinated with cardiothoracic surgery intervention and bronchoscopy. There is a risk that the anterior sternoclavicular dislocation may redislocate. In that event elective reconstruction can be performed later after his more acute injuries have resolved.Plan is for an attempt at closed reduction tomorrow under general anesthetic and muscle relaxation.  DEAN,GREGORY SCOTT 01/25/2015, 5:40 PM

## 2015-02-16 ENCOUNTER — Inpatient Hospital Stay (HOSPITAL_COMMUNITY): Payer: Medicare Other

## 2015-02-16 ENCOUNTER — Observation Stay (HOSPITAL_COMMUNITY): Payer: Medicare Other

## 2015-02-16 ENCOUNTER — Encounter (HOSPITAL_COMMUNITY): Admission: EM | Disposition: E | Payer: Self-pay | Source: Home / Self Care

## 2015-02-16 ENCOUNTER — Ambulatory Visit: Payer: Medicare Other | Admitting: Cardiology

## 2015-02-16 DIAGNOSIS — R6521 Severe sepsis with septic shock: Secondary | ICD-10-CM | POA: Diagnosis not present

## 2015-02-16 DIAGNOSIS — I4891 Unspecified atrial fibrillation: Secondary | ICD-10-CM | POA: Diagnosis not present

## 2015-02-16 DIAGNOSIS — Z66 Do not resuscitate: Secondary | ICD-10-CM | POA: Diagnosis not present

## 2015-02-16 DIAGNOSIS — Z9911 Dependence on respirator [ventilator] status: Secondary | ICD-10-CM | POA: Diagnosis not present

## 2015-02-16 DIAGNOSIS — D62 Acute posthemorrhagic anemia: Secondary | ICD-10-CM | POA: Diagnosis present

## 2015-02-16 DIAGNOSIS — I480 Paroxysmal atrial fibrillation: Secondary | ICD-10-CM | POA: Diagnosis not present

## 2015-02-16 DIAGNOSIS — S023XXA Fracture of orbital floor, initial encounter for closed fracture: Secondary | ICD-10-CM | POA: Diagnosis present

## 2015-02-16 DIAGNOSIS — N179 Acute kidney failure, unspecified: Secondary | ICD-10-CM | POA: Diagnosis present

## 2015-02-16 DIAGNOSIS — R34 Anuria and oliguria: Secondary | ICD-10-CM | POA: Diagnosis present

## 2015-02-16 DIAGNOSIS — G9341 Metabolic encephalopathy: Secondary | ICD-10-CM | POA: Diagnosis present

## 2015-02-16 DIAGNOSIS — Z7951 Long term (current) use of inhaled steroids: Secondary | ICD-10-CM | POA: Diagnosis not present

## 2015-02-16 DIAGNOSIS — E870 Hyperosmolality and hypernatremia: Secondary | ICD-10-CM | POA: Diagnosis present

## 2015-02-16 DIAGNOSIS — J80 Acute respiratory distress syndrome: Secondary | ICD-10-CM | POA: Diagnosis not present

## 2015-02-16 DIAGNOSIS — J189 Pneumonia, unspecified organism: Secondary | ICD-10-CM | POA: Diagnosis present

## 2015-02-16 DIAGNOSIS — R001 Bradycardia, unspecified: Secondary | ICD-10-CM | POA: Diagnosis not present

## 2015-02-16 DIAGNOSIS — Z87891 Personal history of nicotine dependence: Secondary | ICD-10-CM | POA: Diagnosis not present

## 2015-02-16 DIAGNOSIS — I495 Sick sinus syndrome: Secondary | ICD-10-CM | POA: Diagnosis present

## 2015-02-16 DIAGNOSIS — J156 Pneumonia due to other aerobic Gram-negative bacteria: Secondary | ICD-10-CM | POA: Diagnosis not present

## 2015-02-16 DIAGNOSIS — C3491 Malignant neoplasm of unspecified part of right bronchus or lung: Secondary | ICD-10-CM | POA: Diagnosis present

## 2015-02-16 DIAGNOSIS — Z794 Long term (current) use of insulin: Secondary | ICD-10-CM | POA: Diagnosis not present

## 2015-02-16 DIAGNOSIS — A419 Sepsis, unspecified organism: Secondary | ICD-10-CM | POA: Diagnosis not present

## 2015-02-16 DIAGNOSIS — J449 Chronic obstructive pulmonary disease, unspecified: Secondary | ICD-10-CM | POA: Diagnosis present

## 2015-02-16 DIAGNOSIS — E119 Type 2 diabetes mellitus without complications: Secondary | ICD-10-CM | POA: Diagnosis present

## 2015-02-16 DIAGNOSIS — I440 Atrioventricular block, first degree: Secondary | ICD-10-CM | POA: Diagnosis present

## 2015-02-16 DIAGNOSIS — S2242XA Multiple fractures of ribs, left side, initial encounter for closed fracture: Secondary | ICD-10-CM | POA: Diagnosis present

## 2015-02-16 DIAGNOSIS — Z79899 Other long term (current) drug therapy: Secondary | ICD-10-CM | POA: Diagnosis not present

## 2015-02-16 DIAGNOSIS — I469 Cardiac arrest, cause unspecified: Secondary | ICD-10-CM | POA: Diagnosis not present

## 2015-02-16 DIAGNOSIS — W3089XA Contact with other specified agricultural machinery, initial encounter: Secondary | ICD-10-CM | POA: Diagnosis not present

## 2015-02-16 DIAGNOSIS — E872 Acidosis: Secondary | ICD-10-CM | POA: Diagnosis present

## 2015-02-16 DIAGNOSIS — Z923 Personal history of irradiation: Secondary | ICD-10-CM | POA: Diagnosis not present

## 2015-02-16 DIAGNOSIS — J9600 Acute respiratory failure, unspecified whether with hypoxia or hypercapnia: Secondary | ICD-10-CM | POA: Diagnosis present

## 2015-02-16 DIAGNOSIS — J942 Hemothorax: Secondary | ICD-10-CM | POA: Diagnosis present

## 2015-02-16 DIAGNOSIS — S43204A Unspecified dislocation of right sternoclavicular joint, initial encounter: Secondary | ICD-10-CM | POA: Diagnosis present

## 2015-02-16 DIAGNOSIS — R040 Epistaxis: Secondary | ICD-10-CM | POA: Diagnosis present

## 2015-02-16 DIAGNOSIS — S022XXA Fracture of nasal bones, initial encounter for closed fracture: Secondary | ICD-10-CM | POA: Diagnosis present

## 2015-02-16 LAB — BLOOD GAS, ARTERIAL
Acid-base deficit: 3.4 mmol/L — ABNORMAL HIGH (ref 0.0–2.0)
Bicarbonate: 21.9 meq/L (ref 20.0–24.0)
Drawn by: 35849
FIO2: 0.3 %
MECHVT: 570 mL
O2 Saturation: 92 %
PEEP: 5 cmH2O
Patient temperature: 100.4
RATE: 20 {breaths}/min
TCO2: 23.2 mmol/L (ref 0–100)
pCO2 arterial: 47 mmHg — ABNORMAL HIGH (ref 35.0–45.0)
pH, Arterial: 7.296 — ABNORMAL LOW (ref 7.350–7.450)
pO2, Arterial: 71.2 mmHg — ABNORMAL LOW (ref 80.0–100.0)

## 2015-02-16 LAB — GLUCOSE, CAPILLARY
GLUCOSE-CAPILLARY: 80 mg/dL (ref 70–99)
Glucose-Capillary: 131 mg/dL — ABNORMAL HIGH (ref 70–99)
Glucose-Capillary: 61 mg/dL — ABNORMAL LOW (ref 70–99)
Glucose-Capillary: 77 mg/dL (ref 70–99)
Glucose-Capillary: 77 mg/dL (ref 70–99)
Glucose-Capillary: 82 mg/dL (ref 70–99)
Glucose-Capillary: 87 mg/dL (ref 70–99)

## 2015-02-16 LAB — COMPREHENSIVE METABOLIC PANEL
ALBUMIN: 3 g/dL — AB (ref 3.5–5.2)
ALT: 20 U/L (ref 0–53)
ANION GAP: 10 (ref 5–15)
AST: 31 U/L (ref 0–37)
Alkaline Phosphatase: 60 U/L (ref 39–117)
BUN: 24 mg/dL — AB (ref 6–23)
CALCIUM: 8.6 mg/dL (ref 8.4–10.5)
CO2: 21 mmol/L (ref 19–32)
Chloride: 107 mmol/L (ref 96–112)
Creatinine, Ser: 1.62 mg/dL — ABNORMAL HIGH (ref 0.50–1.35)
GFR calc Af Amer: 44 mL/min — ABNORMAL LOW (ref >90)
GFR calc non Af Amer: 38 mL/min — ABNORMAL LOW (ref >90)
Glucose, Bld: 89 mg/dL (ref 70–99)
Potassium: 5.1 mmol/L (ref 3.5–5.1)
Sodium: 138 mmol/L (ref 135–145)
Total Bilirubin: 1.1 mg/dL (ref 0.3–1.2)
Total Protein: 5.9 g/dL — ABNORMAL LOW (ref 6.0–8.3)

## 2015-02-16 LAB — CBC
HCT: 40.4 % (ref 39.0–52.0)
HEMATOCRIT: 32.4 % — AB (ref 39.0–52.0)
HEMOGLOBIN: 12.1 g/dL — AB (ref 13.0–17.0)
Hemoglobin: 9.8 g/dL — ABNORMAL LOW (ref 13.0–17.0)
MCH: 28.1 pg (ref 26.0–34.0)
MCH: 28.2 pg (ref 26.0–34.0)
MCHC: 30 g/dL (ref 30.0–36.0)
MCHC: 30.2 g/dL (ref 30.0–36.0)
MCV: 94 fL (ref 78.0–100.0)
MCV: 93.4 fL (ref 78.0–100.0)
Platelets: 162 10*3/uL (ref 150–400)
Platelets: 158 10*3/uL (ref 150–400)
RBC: 4.3 MIL/uL (ref 4.22–5.81)
RBC: 3.47 MIL/uL — ABNORMAL LOW (ref 4.22–5.81)
RDW: 14.4 % (ref 11.5–15.5)
RDW: 14.5 % (ref 11.5–15.5)
WBC: 11.2 10*3/uL — AB (ref 4.0–10.5)
WBC: 9.7 10*3/uL (ref 4.0–10.5)

## 2015-02-16 LAB — BLOOD PRODUCT ORDER (VERBAL) VERIFICATION

## 2015-02-16 SURGERY — OPEN REDUCTION INTERNAL FIXATION (ORIF) CLAVICULAR FRACTURE
Anesthesia: General

## 2015-02-16 MED ORDER — ALBUMIN HUMAN 5 % IV SOLN
12.5000 g | Freq: Once | INTRAVENOUS | Status: AC
  Administered 2015-02-16: 12.5 g via INTRAVENOUS
  Filled 2015-02-16: qty 250

## 2015-02-16 MED ORDER — ACETAMINOPHEN 650 MG RE SUPP
650.0000 mg | RECTAL | Status: DC | PRN
  Administered 2015-02-16 – 2015-02-22 (×3): 650 mg via RECTAL
  Filled 2015-02-16 (×3): qty 1

## 2015-02-16 MED ORDER — DEXTROSE 50 % IV SOLN
INTRAVENOUS | Status: AC
  Filled 2015-02-16: qty 50

## 2015-02-16 MED ORDER — ALBUMIN HUMAN 5 % IV SOLN
INTRAVENOUS | Status: AC
  Filled 2015-02-16: qty 250

## 2015-02-16 MED ORDER — CEFAZOLIN SODIUM 1-5 GM-% IV SOLN
1.0000 g | Freq: Three times a day (TID) | INTRAVENOUS | Status: DC
  Administered 2015-02-16: 1 g via INTRAVENOUS
  Filled 2015-02-16 (×3): qty 50

## 2015-02-16 MED ORDER — IPRATROPIUM-ALBUTEROL 0.5-2.5 (3) MG/3ML IN SOLN
3.0000 mL | Freq: Four times a day (QID) | RESPIRATORY_TRACT | Status: DC
  Administered 2015-02-16 – 2015-02-24 (×31): 3 mL via RESPIRATORY_TRACT
  Filled 2015-02-16 (×30): qty 3

## 2015-02-16 MED ORDER — ALBUMIN HUMAN 5 % IV SOLN
25.0000 g | Freq: Once | INTRAVENOUS | Status: AC
  Administered 2015-02-16: 25 g via INTRAVENOUS
  Filled 2015-02-16: qty 500

## 2015-02-16 MED ORDER — PIPERACILLIN-TAZOBACTAM 3.375 G IVPB
3.3750 g | Freq: Three times a day (TID) | INTRAVENOUS | Status: DC
  Administered 2015-02-16 – 2015-02-20 (×11): 3.375 g via INTRAVENOUS
  Filled 2015-02-16 (×13): qty 50

## 2015-02-16 MED ORDER — ALBUMIN HUMAN 5 % IV SOLN
12.5000 g | Freq: Once | INTRAVENOUS | Status: AC
  Administered 2015-02-16: 12.5 g via INTRAVENOUS

## 2015-02-16 MED ORDER — SODIUM CHLORIDE 0.9 % IV SOLN
INTRAVENOUS | Status: DC | PRN
Start: 2015-02-16 — End: 2015-02-24

## 2015-02-16 MED ORDER — ALBUMIN HUMAN 5 % IV SOLN: 12.5000 g | Freq: Once | INTRAVENOUS | Status: DC

## 2015-02-16 MED ORDER — VECURONIUM BROMIDE 10 MG IV SOLR
INTRAVENOUS | Status: AC
  Filled 2015-02-16: qty 10

## 2015-02-16 MED ORDER — ALBUMIN HUMAN 5 % IV SOLN
INTRAVENOUS | Status: AC
Start: 2015-02-16 — End: 2015-02-16
  Filled 2015-02-16: qty 250

## 2015-02-16 MED ORDER — DEXTROSE 50 % IV SOLN
1.0000 | Freq: Once | INTRAVENOUS | Status: AC
  Filled 2015-02-16: qty 50

## 2015-02-16 MED ORDER — DEXTROSE 5 % IV SOLN
2.0000 ug/min | INTRAVENOUS | Status: DC
  Administered 2015-02-16 – 2015-02-18 (×2): 2 ug/min via INTRAVENOUS
  Administered 2015-02-22: 25 ug/min via INTRAVENOUS
  Administered 2015-02-22: 6 ug/min via INTRAVENOUS
  Administered 2015-02-22: 25 ug/min via INTRAVENOUS
  Administered 2015-02-23: 20 ug/min via INTRAVENOUS
  Filled 2015-02-16 (×7): qty 4

## 2015-02-16 MED ORDER — SODIUM CHLORIDE 0.9 % IV BOLUS (SEPSIS)
500.0000 mL | Freq: Once | INTRAVENOUS | Status: AC
  Administered 2015-02-16: 500 mL via INTRAVENOUS

## 2015-02-16 MED ORDER — ALBUMIN HUMAN 25 % IV SOLN
25.0000 g | Freq: Once | INTRAVENOUS | Status: DC
  Filled 2015-02-16: qty 100

## 2015-02-16 MED ORDER — VECURONIUM BROMIDE 10 MG IV SOLR
10.0000 mg | Freq: Once | INTRAVENOUS | Status: AC
  Administered 2015-02-16: 10 mg via INTRAVENOUS

## 2015-02-16 SURGICAL SUPPLY — 47 items
APL SKNCLS STERI-STRIP NONHPOA (GAUZE/BANDAGES/DRESSINGS) ×1
BENZOIN TINCTURE PRP APPL 2/3 (GAUZE/BANDAGES/DRESSINGS) ×4 IMPLANT
COVER SURGICAL LIGHT HANDLE (MISCELLANEOUS) ×3 IMPLANT
DRAIN PENROSE 1/2X12 LTX STRL (WOUND CARE) IMPLANT
DRAPE C-ARM 42X72 X-RAY (DRAPES) IMPLANT
DRAPE IMP U-DRAPE 54X76 (DRAPES) ×3 IMPLANT
DRAPE INCISE IOBAN 66X45 STRL (DRAPES) ×8 IMPLANT
DRAPE U-SHAPE 47X51 STRL (DRAPES) ×6 IMPLANT
DRSG MEPILEX BORDER 4X12 (GAUZE/BANDAGES/DRESSINGS) IMPLANT
DRSG MEPILEX BORDER 4X8 (GAUZE/BANDAGES/DRESSINGS) ×3 IMPLANT
DRSG PAD ABDOMINAL 8X10 ST (GAUZE/BANDAGES/DRESSINGS) IMPLANT
DURAPREP 26ML APPLICATOR (WOUND CARE) ×4 IMPLANT
ELECT REM PT RETURN 9FT ADLT (ELECTROSURGICAL) ×3
ELECTRODE REM PT RTRN 9FT ADLT (ELECTROSURGICAL) ×2 IMPLANT
FACESHIELD WRAPAROUND (MASK) ×3 IMPLANT
FACESHIELD WRAPAROUND OR TEAM (MASK) ×1 IMPLANT
GAUZE SPONGE 4X4 12PLY STRL (GAUZE/BANDAGES/DRESSINGS) IMPLANT
GAUZE XEROFORM 5X9 LF (GAUZE/BANDAGES/DRESSINGS) IMPLANT
GLOVE BIO SURGEON ST LM GN SZ9 (GLOVE) ×3 IMPLANT
GLOVE BIOGEL PI IND STRL 8 (GLOVE) ×1 IMPLANT
GLOVE BIOGEL PI INDICATOR 8 (GLOVE) ×2
GLOVE SURG ORTHO 8.0 STRL STRW (GLOVE) ×4 IMPLANT
GOWN STRL REUS W/ TWL LRG LVL3 (GOWN DISPOSABLE) ×2 IMPLANT
GOWN STRL REUS W/ TWL XL LVL3 (GOWN DISPOSABLE) ×1 IMPLANT
GOWN STRL REUS W/TWL LRG LVL3 (GOWN DISPOSABLE) ×6
GOWN STRL REUS W/TWL XL LVL3 (GOWN DISPOSABLE) ×3
KIT BASIN OR (CUSTOM PROCEDURE TRAY) ×4 IMPLANT
KIT ROOM TURNOVER OR (KITS) ×4 IMPLANT
MANIFOLD NEPTUNE II (INSTRUMENTS) ×3 IMPLANT
NEEDLE 21X1 OR PACK (NEEDLE) IMPLANT
NS IRRIG 1000ML POUR BTL (IV SOLUTION) ×4 IMPLANT
PACK SHOULDER (CUSTOM PROCEDURE TRAY) ×4 IMPLANT
PACK UNIVERSAL I (CUSTOM PROCEDURE TRAY) ×3 IMPLANT
PAD ARMBOARD 7.5X6 YLW CONV (MISCELLANEOUS) ×6 IMPLANT
PENCIL BUTTON HOLSTER BLD 10FT (ELECTRODE) IMPLANT
SLING ARM IMMOBILIZER LRG (SOFTGOODS) IMPLANT
SLING ARM IMMOBILIZER MED (SOFTGOODS) IMPLANT
SPONGE LAP 4X18 X RAY DECT (DISPOSABLE) ×6 IMPLANT
STAPLER VISISTAT 35W (STAPLE) IMPLANT
SUCTION FRAZIER TIP 10 FR DISP (SUCTIONS) IMPLANT
SUT VIC AB 2-0 CTB1 (SUTURE) IMPLANT
TOWEL OR 17X24 6PK STRL BLUE (TOWEL DISPOSABLE) ×4 IMPLANT
TOWEL OR 17X26 10 PK STRL BLUE (TOWEL DISPOSABLE) ×4 IMPLANT
TUBE CONNECTING 12'X1/4 (SUCTIONS)
TUBE CONNECTING 12X1/4 (SUCTIONS) IMPLANT
WATER STERILE IRR 1000ML POUR (IV SOLUTION) ×4 IMPLANT
YANKAUER SUCT BULB TIP NO VENT (SUCTIONS) IMPLANT

## 2015-02-16 NOTE — Procedures (Signed)
Central Venous Catheter Insertion Procedure Note Robley Matassa [id number] [birth year]  Procedure: Insertion of Central Venous Catheter Indications: Drug and/or fluid administration  Procedure Details Consent: Risks of procedure as well as the alternatives and risks of each were explained to the (patient/caregiver).  Consent for procedure obtained. Time Out: Verified patient identification, verified procedure, site/side was marked, verified correct patient position, special equipment/implants available, medications/allergies/relevent history reviewed, required imaging and test results available.  Performed  Maximum sterile technique was used including antiseptics, cap, gloves, gown, hand hygiene, mask and sheet. Skin prep: Chlorhexidine; local anesthetic administered A antimicrobial bonded/coated triple lumen catheter was placed in the left subclavian vein using the Seldinger technique.  Evaluation Blood flow good Complications: No apparent complications Patient did tolerate procedure well. Chest X-ray ordered to verify placement.  CXR: pending.  Jahmai Finelli E 01/30/2015, 3:11 PM

## 2015-02-16 NOTE — Progress Notes (Signed)
Pt medically unstable Will defer reduction until possibly next week

## 2015-02-16 NOTE — Progress Notes (Addendum)
      [address]       [place] [address]             [phone]       Day of Surgery Procedure(s) (LRB): CLOSE REDUCTION STERO CLAVICULAR LOCATION (N/A)  Subjective: He remains intubated and sedated  Objective: Vital signs in last 24 hours: Temp:  [95.9 F (35.5 C)-100.6 F (38.1 C)] 100 F (37.8 C) (03/24 0800) Pulse Rate:  [51-136] 83 (03/24 0800) Cardiac Rhythm:  [-] Heart block (03/24 0800) Resp:  [13-43] 18 (03/24 0800) BP: (63-171)/(50-104) 96/56 mmHg (03/24 0800) SpO2:  [90 %-100 %] 95 % (03/24 0800) FiO2 (%):  [30 %-50 %] 30 % (03/24 0800) Weight:  [150 lb (68.04 kg)] 150 lb (68.04 kg) (03/23 1340)     Intake/Output from previous day: 03/23 0701 - 03/24 0700 In: 3117.7 [I.V.:3017.7; IV Piggyback:100] Out: 1240 [Urine:1090; Drains:150]   Physical Exam:  Cardiovascular: RRR Pulmonary: Diminished at bases L>R; no rales, wheezes, or rhonchi. Abdomen: Soft, non tender, bowel sounds present. Extremities: SCDS in place    Lab Results: CBC: Recent Labs  02/21/2015 1417 01/24/2015 0302  WBC 13.2* 11.2*  HGB 12.2* 12.1*  HCT 39.4 40.4  PLT 149* 162   BMET:  Recent Labs  01/25/2015 1326 02/22/2015 0302  NA 139 138  K 4.5 5.1  CL 105 107  CO2 28 21  GLUCOSE 106* 89  BUN 21 24*  CREATININE 1.56* 1.62*  CALCIUM 9.2 8.6    PT/INR:  Recent Labs  02/18/2015 1326  LABPROT 13.7  INR 1.04   ABG:  INR: Will add last result for INR, ABG once components are confirmed Will add last 4 CBG results once components are confirmed  Assessment/Plan:  1. CV - SR with HR in the 80's this am, missed beat 2.  Pulmonary - CXR shows no pneumothorax, multiple left rib fractures, persistent right upper lobe suprahilar mass. 3. H and H stable at 12.1 and 40.4 4. Creatinine slightly increase from 1.56 to 1.62 5. To OR today with ortho for closed reduction of right AC joint.  ZIMMERMAN,DONIELLE MPA-C 01/24/2015,8:50 AM  CXR today shows improvement  w/o progression of ARDS Patient has  known dx of R lung cancer according to family No indication for videobronchoscopy at this time Will check CXR in am  patient examined and medical record reviewed,agree with above note. Tharon Aquas Trigt III 02/09/2015

## 2015-02-16 NOTE — Progress Notes (Signed)
RT Note:  Tracheal Aspirate sent to lab

## 2015-02-16 NOTE — Progress Notes (Signed)
UR completed 

## 2015-02-16 NOTE — Progress Notes (Unsigned)
Clinical Summary Brendan Hall is a 81 y.o.male seen today for follow up of the following medical problems.   1. Chronic diastolic heart failure  - echo 02/2014 showed LVEF 50-55%, could not evaluate diastolic dysfunction, dilated IVC with normal resp response. -Presumed diastolic dysfunction   - reports some occasional SOB. DOE at 1-2 blocks. No LE edema, + orthopnea + PND x 1 week. Compliant with lasix 40mg  daily, does not taken any additional.    2. Aflutter  - prior ablation, no longer on anticoag  -denies any palpitations, has not required AV nodal agents  3. PAD  - followed by vascular Dr Scot Dock   4. COPD  - followed by Dr Luan Pulling.  - compliant with inhalers, he is on home O2   5. Hyperlipidemia  -last lipid panel 01/2014 HDL 81 LDL 30 TG 82  - compliant with statin  6. Heart block- Mobitz I - EKG from urgent visit 07/11/14 showed second degree type I av block. This has been noted before and is chronic. - denies any lightheadedness, dizziness or significant fatigue  7. CKD Last GR 49 by pvp 01/2014, K 4.7.  - followed by pcp  Past Medical History  Diagnosis Date  . Arteriosclerotic cardiovascular disease (ASCVD)     Myocardial infarction in 1980; chronic dyspnea on exertion; H/o CHF;  04/2012-normal EF by echo  . Hyperlipidemia   . Hypertension   . Diabetes mellitus, type II   . Degenerative joint disease   . COPD (chronic obstructive pulmonary disease)     Home oxygen; pulmonary nodules  . Colon polyps 08/2011    Colonoscopy, Dr. Laural Golden.  Marland Kitchen BPH (benign prostatic hyperplasia)     Urinary retention-10/2011; right renal mass  . Iron deficiency anemia 11/14/2011  . Atrial flutter with rapid ventricular response 04/29/2012    s/p RFCA 12/04/12  . Lung cancer   . Peripheral vascular disease      No Known Allergies   Current Outpatient Prescriptions  Medication Sig Dispense Refill  . albuterol (PROVENTIL HFA;VENTOLIN HFA) 108 (90 BASE) MCG/ACT  inhaler Inhale 2 puffs into the lungs every 4 (four) hours as needed for wheezing or shortness of breath. 1 Inhaler 0  . allopurinol (ZYLOPRIM) 300 MG tablet Take 300 mg by mouth daily.    Marland Kitchen aspirin EC 81 MG tablet Take 1 tablet (81 mg total) by mouth daily.    . benzonatate (TESSALON) 100 MG capsule Take 1 capsule (100 mg total) by mouth 3 (three) times daily as needed for cough. 15 capsule 0  . Fluticasone Furoate-Vilanterol (BREO ELLIPTA) 100-25 MCG/INH AEPB Inhale 1 puff into the lungs every morning.    . furosemide (LASIX) 40 MG tablet Take 1 tablet (40 mg total) by mouth daily. CAN TAKE ADDITIONAL 20MG  PRN (Patient not taking: Reported on 11/17/2014) 30 tablet 6  . furosemide (LASIX) 40 MG tablet Take 20-40 mg by mouth 2 (two) times daily.    . insulin glargine (LANTUS) 100 UNIT/ML injection Inject 15 Units into the skin every morning.     Marland Kitchen ipratropium-albuterol (DUONEB) 0.5-2.5 (3) MG/3ML SOLN Take 3 mLs by nebulization 4 (four) times daily.    Marland Kitchen levofloxacin (LEVAQUIN) 750 MG tablet Take 750 mg by mouth daily.    . predniSONE (DELTASONE) 20 MG tablet Take 2 tablets (40 mg total) by mouth daily. Start 11/18/2014 10 tablet 0  . roflumilast (DALIRESP) 500 MCG TABS tablet Take 500 mcg by mouth daily with supper.     Marland Kitchen  simvastatin (ZOCOR) 40 MG tablet Take 40 mg by mouth daily with supper.     . Tamsulosin HCl (FLOMAX) 0.4 MG CAPS Take 0.8 mg by mouth daily after supper.    . vitamin B-12 (CYANOCOBALAMIN) 1000 MCG tablet Take 1,000 mcg by mouth daily.     No current facility-administered medications for this visit.     Past Surgical History  Procedure Laterality Date  . Soft tissue cyst excision      Face  . Colonoscopy  08/28/2011    Procedure: COLONOSCOPY;  Surgeon: Rogene Houston, MD;  Location: [facility];  Service: Endoscopy;  Laterality: N/A;  . Radiofrequency ablation  11/2012    Atrial flutter  . Atrial flutter ablation N/A 12/04/2012    Procedure: ATRIAL FLUTTER ABLATION;   Surgeon: Evans Lance, MD;  Location: [facility] CATH LAB;  Service: Cardiovascular;  Laterality: N/A;     No Known Allergies    Family History  Problem Relation Age of Onset  . Cancer Mother   . Cancer Sister   . Cancer Brother      Social History Brendan Hall reports that he quit smoking about 6 years ago. His smoking use included Cigarettes. He started smoking about 67 years ago. He has a 90 pack-year smoking history. He has never used smokeless tobacco. Brendan Hall reports that he does not drink alcohol.   Review of Systems CONSTITUTIONAL: No weight loss, fever, chills, weakness or fatigue.  HEENT: Eyes: No visual loss, blurred vision, double vision or yellow sclerae.No hearing loss, sneezing, congestion, runny nose or sore throat.  SKIN: No rash or itching.  CARDIOVASCULAR:  RESPIRATORY: No shortness of breath, cough or sputum.  GASTROINTESTINAL: No anorexia, nausea, vomiting or diarrhea. No abdominal pain or blood.  GENITOURINARY: No burning on urination, no polyuria NEUROLOGICAL: No headache, dizziness, syncope, paralysis, ataxia, numbness or tingling in the extremities. No change in bowel or bladder control.  MUSCULOSKELETAL: No muscle, back pain, joint pain or stiffness.  LYMPHATICS: No enlarged nodes. No history of splenectomy.  PSYCHIATRIC: No history of depression or anxiety.  ENDOCRINOLOGIC: No reports of sweating, cold or heat intolerance. No polyuria or polydipsia.  Marland Kitchen   Physical Examination There were no vitals filed for this visit. There were no vitals filed for this visit.  Gen: resting comfortably, no acute distress HEENT: no scleral icterus, pupils equal round and reactive, no palptable cervical adenopathy,  CV Resp: Clear to auscultation bilaterally GI: abdomen is soft, non-tender, non-distended, normal bowel sounds, no hepatosplenomegaly MSK: extremities are warm, no edema.  Skin: warm, no rash Neuro:  no focal deficits Psych: appropriate  affect   Diagnostic Studies 01/2014 ABI  Right 0.75 Left 0.65   02/2014 Echo  Study data: Technically difficult study. - Procedure narrative: Transthoracic echocardiography. Image quality was suboptimal. The study was technically difficult, as a result of poor sound wave transmission. - Left ventricle: The cavity size was normal. Wall thickness was increased in a pattern of mild LVH. Systolic function was normal. The estimated ejection fraction was in the range of 50% to 55%. The study is not technically sufficient to allow evaluation of LV diastolic function. - Aortic valve: Mildly calcified annulus. Mildly thickened leaflets. Valve area: 1.79cm^2(VTI). - Mitral valve: Mildly calcified annulus. Mildly thickened leaflets . - Left atrium: The atrium was mildly dilated. - Systemic veins: The IVC is dilated with normal respiratory response. Estimated RA pressure is 8 mmHg.    Assessment and Plan  1. Chronic diastolic heart failure  -  main component overall of his DOE is COPD  - recent echo shows normal LV systolic function, unable to evaluate diastolic function but does have enlarged LA and dilated IVC, suspect he does have heart failure w/ preserved LVEF   - currently appears euvolemic, reports some orthopnea and PND at times. Counseled ok to take additional lasix 20mg  if needed at these times, educated on daily weights.   2. Aflutter  - no evidence of recurrence after RFA, has not been on anticoag  - continue to follow clinically  3. PAD  - continue follow up with vascular   4. COPD  - continue f/u with Dr Luan Pulling, continue home oxygen  5. 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  6. CKD - management per pcp, not on ACE or ARB presume due to borderline high K.        Arnoldo Lenis, M.D.

## 2015-02-16 NOTE — Progress Notes (Signed)
Patient ID: Brendan Hall, male   DOB: 28-Jan-1930, 81 y.o.   MRN: [id number] Case cancelled tonight due to nonurgent nature. He appears to likely have pna and will broaden abx. Continue vent support. Has been volume resuscitated, hct fine, if continues with hypotension will start norepi

## 2015-02-16 NOTE — Progress Notes (Signed)
HD#2 orbital floor, nasal spine fracture  Temp:  [95.9 F (35.5 C)-100.6 F (38.1 C)] 100 F (37.8 C) (03/24 0700) Pulse Rate:  [51-136] 84 (03/24 0731) Resp:  [13-43] 24 (03/24 0731) BP: (63-171)/(50-104) 136/79 mmHg (03/24 0731) SpO2:  [90 %-100 %] 100 % (03/24 0731) FiO2 (%):  [30 %-50 %] 30 % (03/24 0732) Weight:  [68.04 kg (150 lb)] 68.04 kg (150 lb) (03/23 1340)   No acute events overnight Intubated, alert  Nasal packing removed, no gross bleeding evident  No acute intervention for above injuries, will need to do visual exam following extubation regarding orbital floor fracture.   Irene Limbo, MD [facility] [phone]
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
## 2015-02-16 NOTE — Procedures (Signed)
Arterial Catheter Insertion Procedure Note Malique Driskill [id number] [birth year]  Procedure: Insertion of Arterial Catheter  Indications: Blood pressure monitoring  Procedure Details Consent: Risks of procedure as well as the alternatives and risks of each were explained to the (patient/caregiver).  Consent for procedure obtained. Time Out: Verified patient identification, verified procedure, site/side was marked, verified correct patient position, special equipment/implants available, medications/allergies/relevent history reviewed, required imaging and test results available.  Performed  Maximum sterile technique was used including antiseptics, cap, gloves, gown, hand hygiene, mask and sheet. Skin prep: Chlorhexidine; local anesthetic administered 20 gauge catheter was inserted into left radial artery using the Seldinger technique.  Evaluation Blood flow good; BP tracing good. Complications: No apparent complications.   Mcneil Sober 01/27/2015

## 2015-02-17 ENCOUNTER — Inpatient Hospital Stay (HOSPITAL_COMMUNITY): Payer: Medicare Other

## 2015-02-17 LAB — BLOOD GAS, ARTERIAL
Acid-base deficit: 5.1 mmol/L — ABNORMAL HIGH (ref 0.0–2.0)
BICARBONATE: 19.9 meq/L — AB (ref 20.0–24.0)
Drawn by: 405301
FIO2: 0.4 %
LHR: 22 {breaths}/min
MECHVT: 570 mL
O2 Saturation: 90.9 %
PEEP: 5 cmH2O
PH ART: 7.313 — AB (ref 7.350–7.450)
PO2 ART: 65.3 mmHg — AB (ref 80.0–100.0)
Patient temperature: 99.5
TCO2: 21.2 mmol/L (ref 0–100)
pCO2 arterial: 40.8 mmHg (ref 35.0–45.0)

## 2015-02-17 LAB — GLUCOSE, CAPILLARY
GLUCOSE-CAPILLARY: 69 mg/dL — AB (ref 70–99)
GLUCOSE-CAPILLARY: 90 mg/dL (ref 70–99)
GLUCOSE-CAPILLARY: 91 mg/dL (ref 70–99)
Glucose-Capillary: 102 mg/dL — ABNORMAL HIGH (ref 70–99)
Glucose-Capillary: 117 mg/dL — ABNORMAL HIGH (ref 70–99)
Glucose-Capillary: 117 mg/dL — ABNORMAL HIGH (ref 70–99)
Glucose-Capillary: 152 mg/dL — ABNORMAL HIGH (ref 70–99)
Glucose-Capillary: 153 mg/dL — ABNORMAL HIGH (ref 70–99)

## 2015-02-17 LAB — BASIC METABOLIC PANEL
Anion gap: 6 (ref 5–15)
BUN: 31 mg/dL — AB (ref 6–23)
CALCIUM: 8.6 mg/dL (ref 8.4–10.5)
CO2: 22 mmol/L (ref 19–32)
Chloride: 114 mmol/L — ABNORMAL HIGH (ref 96–112)
Creatinine, Ser: 1.85 mg/dL — ABNORMAL HIGH (ref 0.50–1.35)
GFR calc Af Amer: 37 mL/min — ABNORMAL LOW (ref >90)
GFR calc non Af Amer: 32 mL/min — ABNORMAL LOW (ref >90)
GLUCOSE: 98 mg/dL (ref 70–99)
Potassium: 5.1 mmol/L (ref 3.5–5.1)
Sodium: 142 mmol/L (ref 135–145)

## 2015-02-17 LAB — CBC
HCT: 30.2 % — ABNORMAL LOW (ref 39.0–52.0)
HEMOGLOBIN: 9.2 g/dL — AB (ref 13.0–17.0)
MCH: 28.3 pg (ref 26.0–34.0)
MCHC: 30.5 g/dL (ref 30.0–36.0)
MCV: 92.9 fL (ref 78.0–100.0)
PLATELETS: 140 10*3/uL — AB (ref 150–400)
RBC: 3.25 MIL/uL — ABNORMAL LOW (ref 4.22–5.81)
RDW: 14.7 % (ref 11.5–15.5)
WBC: 9.4 10*3/uL (ref 4.0–10.5)

## 2015-02-17 MED ORDER — SODIUM CHLORIDE 0.9 % IV SOLN
INTRAVENOUS | Status: DC
  Administered 2015-02-17 – 2015-02-18 (×3): via INTRAVENOUS
  Administered 2015-02-19: 1000 mL via INTRAVENOUS
  Administered 2015-02-19 – 2015-02-21 (×5): via INTRAVENOUS

## 2015-02-17 MED ORDER — PIVOT 1.5 CAL PO LIQD
1000.0000 mL | ORAL | Status: DC
  Administered 2015-02-17 – 2015-02-20 (×4): 1000 mL
  Filled 2015-02-17 (×6): qty 1000

## 2015-02-17 MED ORDER — DEXTROSE 50 % IV SOLN
INTRAVENOUS | Status: AC
  Filled 2015-02-17: qty 50

## 2015-02-17 MED ORDER — DEXTROSE 50 % IV SOLN
1.0000 | Freq: Once | INTRAVENOUS | Status: AC
  Administered 2015-02-17: 50 mL via INTRAVENOUS
  Filled 2015-02-17: qty 50

## 2015-02-17 NOTE — Progress Notes (Addendum)
Follow up - Trauma and Critical Care  Patient Details:    Brendan Hall is an 81 y.o. male.  Lines/tubes : Airway 8 mm (Active)  Secured at (cm) 24 cm 02/17/2015  8:31 AM  Measured From Lips 02/17/2015  8:31 AM  Secured Location Right 02/17/2015  8:31 AM  Secured By [employer] 02/17/2015  8:31 AM  Tube Holder Repositioned Yes 02/17/2015  8:31 AM  Cuff Pressure (cm H2O) 22 cm H2O 02/08/2015  7:54 PM  Site Condition Dry 02/17/2015  8:31 AM     CVC Triple Lumen 02/20/2015 Left Subclavian (Active)  Indication for Insertion or Continuance of Line Prolonged intravenous therapies 02/17/2015  8:00 AM  Site Assessment Clean;Dry;Intact 02/17/2015  8:00 AM  Proximal Lumen Status Infusing 02/17/2015  8:00 AM  Medial Infusing 02/17/2015  8:00 AM  Distal Lumen Status Other (Comment) 02/17/2015  8:00 AM  Dressing Type Transparent;Non-occlusive 02/17/2015  8:00 AM  Dressing Status Clean;Dry;Intact;Antimicrobial disc in place 02/17/2015  8:00 AM  Line Care Connections checked and tightened;Line pulled back;Zeroed and calibrated;Leveled 02/17/2015  8:00 AM     Arterial Line 01/31/2015 Left Radial (Active)  Site Assessment Clean;Dry 02/17/2015  8:00 AM  Line Status Pulsatile blood flow 02/17/2015  8:00 AM  Art Line Waveform Appropriate;Square wave test performed 02/17/2015  8:00 AM  Art Line Interventions Zeroed and calibrated;Leveled;Connections checked and tightened 02/17/2015  8:00 AM  Color/Movement/Sensation Capillary refill less than 3 sec 02/17/2015  8:00 AM  Dressing Type Transparent;Gauze 02/17/2015  8:00 AM  Dressing Status Clean;Dry;Intact 02/17/2015  8:00 AM  Dressing Change Due 02/23/15 02/17/2015  8:00 AM     NG/OG Tube Orogastric 16 Fr. Right mouth (Active)  Placement Verification Auscultation 02/09/2015  8:00 PM  Site Assessment Dry;Intact 02/17/2015  8:00 AM  Status Suction-low intermittent 02/17/2015  8:00 AM  Drainage Appearance Brown 02/17/2015  8:00 AM  Output (mL) 250 mL 02/17/2015  4:00  AM     Urethral Catheter [name], NT Temperature probe 16 Fr. (Active)  Indication for Insertion or Continuance of Catheter Unstable critical patients (first 24-48 hours) 02/17/2015  8:00 AM  Site Assessment Clean;Intact 02/17/2015  8:00 AM  Catheter Maintenance Bag below level of bladder;Catheter secured;Drainage bag/tubing not touching floor;Seal intact 02/17/2015  8:00 AM  Collection Container Standard drainage bag 02/17/2015  8:00 AM  Securement Method Leg strap 02/17/2015  8:00 AM  Urinary Catheter Interventions Unclamped 02/02/2015  8:00 PM  Output (mL) 35 mL 02/17/2015  9:00 AM    Microbiology/Sepsis markers: Results for orders placed or performed during the hospital encounter of 02/19/2015  Culture, respiratory (NON-Expectorated)     Status: None (Preliminary result)   Collection Time: 02/08/2015 12:20 AM  Result Value Ref Range Status   Specimen Description TRACHEAL ASPIRATE  Final   Special Requests Normal  Final   Gram Stain   Final    ABUNDANT WBC PRESENT, PREDOMINANTLY PMN NO SQUAMOUS EPITHELIAL CELLS SEEN ABUNDANT GRAM NEGATIVE COCCI FEW GRAM NEGATIVE RODS RARE GRAM POSITIVE COCCI    Culture   Final    Culture reincubated for better growth Performed at [facility]    Report Status PENDING  Incomplete    Anti-infectives:  Anti-infectives    Start     Dose/Rate Route Frequency Ordered Stop   01/30/2015 1945  piperacillin-tazobactam (ZOSYN) IVPB 3.375 g     3.375 g 12.5 mL/hr over 240 Minutes Intravenous Every 8 hours 02/17/2015 1944     01/26/2015 1530  ceFAZolin (ANCEF) IVPB 1  g/50 mL premix  Status:  Discontinued     1 g 100 mL/hr over 30 Minutes Intravenous 3 times per day 02/08/2015 1506 01/27/2015 1919   01/30/2015 2200  ceFAZolin (ANCEF) IVPB 1 g/50 mL premix  Status:  Discontinued     1 g 100 mL/hr over 30 Minutes Intravenous 3 times per day 01/30/2015 1804 02/22/2015 1506      Best Practice/Protocols:  VTE Prophylaxis: Mechanical GI Prophylaxis: Proton Pump  Inhibitor Continous Sedation On propofol and fentanyl  Consults: Treatment Team:  Ivin Poot, MD    Events:  Subjective:    Overnight Issues: Patient still on the ventilator, surgery was cancelled yesterday because of hemodynamic instability.  Objective:  Vital signs for last 24 hours: Temp:  [99.5 F (37.5 C)-102.2 F (39 C)] 102.2 F (39 C) (03/25 0900) Pulse Rate:  [71-96] 77 (03/25 0900) Resp:  [18-26] 22 (03/25 0900) BP: (73-143)/(43-104) 110/55 mmHg (03/25 0900) SpO2:  [90 %-98 %] 94 % (03/25 0900) Arterial Line BP: (63-114)/(36-57) 94/40 mmHg (03/25 0900) FiO2 (%):  [30 %-40 %] 40 % (03/25 0900)  Hemodynamic parameters for last 24 hours: CVP:  [5 mmHg-22 mmHg] 7 mmHg  Intake/Output from previous day: 03/24 0701 - 03/25 0700 In: 2946.9 [I.V.:2846.9; IV Piggyback:100] Out: 1080 [Urine:830; Emesis/NG output:250]  Intake/Output this shift: Total I/O In: 236.2 [I.V.:236.2] Out: 95 [Urine:95]  Vent settings for last 24 hours: Vent Mode:  [-] PRVC FiO2 (%):  [30 %-40 %] 40 % Set Rate:  [20 bmp-22 bmp] 22 bmp Vt Set:  [570 mL] 570 mL PEEP:  [5 cmH20] 5 cmH20 Plateau Pressure:  [13 OYD74-12 cmH20] 21 cmH20  Physical Exam:  General: no respiratory distress and but on FIO2 40% Neuro: RASS -1 Resp: diminished breath sounds RLL and RML and rhonchi bilaterally GI: Soft with good bowel sounds.  Not apparently tender. Extremities: no edema, no erythema, pulses WNL  Results for orders placed or performed during the hospital encounter of 02/13/2015 (from the past 24 hour(s))  Glucose, capillary     Status: Abnormal   Collection Time: 01/31/2015 11:28 AM  Result Value Ref Range   Glucose-Capillary 61 (L) 70 - 99 mg/dL   Comment 1 Notify RN    Comment 2 Document in Chart   Blood gas, arterial     Status: Abnormal   Collection Time: 01/26/2015 11:55 AM  Result Value Ref Range   FIO2 0.30 %   Delivery systems VENTILATOR    Mode PRESSURE REGULATED VOLUME CONTROL     VT 570 mL   Rate 20 resp/min   Peep/cpap 5.0 cm H20   pH, Arterial 7.296 (L) 7.350 - 7.450   pCO2 arterial 47.0 (H) 35.0 - 45.0 mmHg   pO2, Arterial 71.2 (L) 80.0 - 100.0 mmHg   Bicarbonate 21.9 20.0 - 24.0 mEq/L   TCO2 23.2 0 - 100 mmol/L   Acid-base deficit 3.4 (H) 0.0 - 2.0 mmol/L   O2 Saturation 92.0 %   Patient temperature 100.4    Collection site A-LINE    Drawn by [phone]    Sample type ARTERIAL DRAW   Glucose, capillary     Status: Abnormal   Collection Time: 02/17/2015  1:36 PM  Result Value Ref Range   Glucose-Capillary 131 (H) 70 - 99 mg/dL  CBC     Status: Abnormal   Collection Time: 01/31/2015  4:30 PM  Result Value Ref Range   WBC 9.7 4.0 - 10.5 K/uL   RBC 3.47 (L) 4.22 -  5.81 MIL/uL   Hemoglobin 9.8 (L) 13.0 - 17.0 g/dL   HCT 32.4 (L) 39.0 - 52.0 %   MCV 93.4 78.0 - 100.0 fL   MCH 28.2 26.0 - 34.0 pg   MCHC 30.2 30.0 - 36.0 g/dL   RDW 14.5 11.5 - 15.5 %   Platelets 158 150 - 400 K/uL  Glucose, capillary     Status: None   Collection Time: 02/08/2015  5:09 PM  Result Value Ref Range   Glucose-Capillary 82 70 - 99 mg/dL  Glucose, capillary     Status: None   Collection Time: 02/19/2015  8:17 PM  Result Value Ref Range   Glucose-Capillary 80 70 - 99 mg/dL  Provider-confirm verbal Blood Bank order - Type & Screen, RBC, FFP; 2 Units; Order taken: 02/14/2015; 1:10 PM; Level 1 Trauma     Status: None   Collection Time: 01/24/2015  8:20 PM  Result Value Ref Range   Blood product order confirm MD AUTHORIZATION REQUESTED   Glucose, capillary     Status: None   Collection Time: 02/12/2015 11:37 PM  Result Value Ref Range   Glucose-Capillary 90 70 - 99 mg/dL  Glucose, capillary     Status: None   Collection Time: 02/17/15  3:29 AM  Result Value Ref Range   Glucose-Capillary 91 70 - 99 mg/dL  CBC     Status: Abnormal   Collection Time: 02/17/15  3:49 AM  Result Value Ref Range   WBC 9.4 4.0 - 10.5 K/uL   RBC 3.25 (L) 4.22 - 5.81 MIL/uL   Hemoglobin 9.2 (L) 13.0 - 17.0 g/dL    HCT 30.2 (L) 39.0 - 52.0 %   MCV 92.9 78.0 - 100.0 fL   MCH 28.3 26.0 - 34.0 pg   MCHC 30.5 30.0 - 36.0 g/dL   RDW 14.7 11.5 - 15.5 %   Platelets 140 (L) 150 - 400 K/uL  Basic metabolic panel     Status: Abnormal   Collection Time: 02/17/15  3:49 AM  Result Value Ref Range   Sodium 142 135 - 145 mmol/L   Potassium 5.1 3.5 - 5.1 mmol/L   Chloride 114 (H) 96 - 112 mmol/L   CO2 22 19 - 32 mmol/L   Glucose, Bld 98 70 - 99 mg/dL   BUN 31 (H) 6 - 23 mg/dL   Creatinine, Ser 1.85 (H) 0.50 - 1.35 mg/dL   Calcium 8.6 8.4 - 10.5 mg/dL   GFR calc non Af Amer 32 (L) >90 mL/min   GFR calc Af Amer 37 (L) >90 mL/min   Anion gap 6 5 - 15  Blood gas, arterial     Status: Abnormal   Collection Time: 02/17/15  4:30 AM  Result Value Ref Range   FIO2 0.40 %   Delivery systems VENTILATOR    Mode PRESSURE REGULATED VOLUME CONTROL    VT 570 mL   Rate 22 resp/min   Peep/cpap 5.0 cm H20   pH, Arterial 7.313 (L) 7.350 - 7.450   pCO2 arterial 40.8 35.0 - 45.0 mmHg   pO2, Arterial 65.3 (L) 80.0 - 100.0 mmHg   Bicarbonate 19.9 (L) 20.0 - 24.0 mEq/L   TCO2 21.2 0 - 100 mmol/L   Acid-base deficit 5.1 (H) 0.0 - 2.0 mmol/L   O2 Saturation 90.9 %   Patient temperature 99.5    Collection site A-LINE    Drawn by [id number]    Sample type ARTERIAL DRAW   Glucose, capillary  Status: Abnormal   Collection Time: 02/17/15  8:07 AM  Result Value Ref Range   Glucose-Capillary 69 (L) 70 - 99 mg/dL   Comment 1 Notify RN    Comment 2 Document in Chart   Glucose, capillary     Status: Abnormal   Collection Time: 02/17/15  8:52 AM  Result Value Ref Range   Glucose-Capillary 153 (H) 70 - 99 mg/dL     Assessment/Plan:   NEURO  Altered Mental Status:  sedation   Plan: Keeping sedated until patient can be bronchoscoped.  PULM  Atelectasis/collapse (focal and right lung primarily)   Plan: Polanning for bronchoscopy later this morning  CARDIO  No specific issues   Plan: CPM  RENAL  Oliguria (Mild and  probablay related to volume, although cannot tell where the patient could be bleeding.)   Plan: CPM  GI  No specific issues   Plan: Start tube feedings  ID  Pneumonia (has gram stain positive but no specific organism.)   Plan: CPM  HEME  Anemia acute blood loss anemia)   Plan: Area of loss is unknown.  Hemoglobin has dropped since the patient has been hydrated.  ENDO No specific issues   Plan: Nospecific treatment  Global Issues  Patient has maintained BP better after volume has been given, but unsure if we are just catching up.  Needs bronchoscopy, and will send a repeat BAL.  Start tube feedngs.   LOS: 1 day   Additional comments:I reviewed the patient's new clinical lab test results. cbc/bmet and I reviewed the patients new imaging test results. cxr  Critical Care Total Time*: 30 Minutes  Mackinzie Vuncannon, JAY 02/17/2015  *Care during the described time interval was provided by me and/or other providers on the critical care team.  I have reviewed this patient's available data, including medical history, events of note, physical examination and test results as part of my evaluation.
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
## 2015-02-17 NOTE — Progress Notes (Addendum)
      [address]       [place], [address]             [phone]       1 Day Post-Op Procedure(s) (LRB): CLOSE REDUCTION STERO CLAVICULAR LOCATION (N/A)  Subjective: He remains intubated and sedated  Objective: Vital signs in last 24 hours: Temp:  [99.5 F (37.5 C)-102.2 F (39 C)] 102.2 F (39 C) (03/25 0900) Pulse Rate:  [71-96] 77 (03/25 0900) Cardiac Rhythm:  [-] Heart block (03/25 0800) Resp:  [18-26] 22 (03/25 0900) BP: (73-143)/(43-104) 110/55 mmHg (03/25 0900) SpO2:  [90 %-98 %] 94 % (03/25 0900) Arterial Line BP: (63-114)/(36-57) 94/40 mmHg (03/25 0900) FiO2 (%):  [30 %-40 %] 40 % (03/25 0900)  CVP:  [5 mmHg-22 mmHg] 7 mmHg  Intake/Output from previous day: 03/24 0701 - 03/25 0700 In: 2946.9 [I.V.:2846.9; IV Piggyback:100] Out: 1080 [Urine:830; Emesis/NG output:250]   Physical Exam:  Cardiovascular: RRR Pulmonary: Coarse breath sounds bilaterally. Abdomen: Soft, non tender, occ bowel sounds present. Extremities: SCDS in place    Lab Results: CBC:  Recent Labs  02/05/2015 1630 02/17/15 0349  WBC 9.7 9.4  HGB 9.8* 9.2*  HCT 32.4* 30.2*  PLT 158 140*   BMET:   Recent Labs  01/30/2015 0302 02/17/15 0349  NA 138 142  K 5.1 5.1  CL 107 114*  CO2 21 22  GLUCOSE 89 98  BUN 24* 31*  CREATININE 1.62* 1.85*  CALCIUM 8.6 8.6    PT/INR:   Recent Labs  01/25/2015 1326  LABPROT 13.7  INR 1.04   ABG:  INR: Will add last result for INR, ABG once components are confirmed Will add last 4 CBG results once components are confirmed  Assessment/Plan:  1. CV - SR with HR in the 70's this am. On Nor epi drip for hypotension 2.  Pulmonary - CXR shows patient is rotated to the left,no pneumothorax, multiple left rib fractures, persistent right upper lobe suprahilar mass, volume loss on right, and bibasilar atelectasis. Check CXR in am. 3. H and H stable at 9.2 and 30.2 4. Creatinine increased from 1.62 to 1.85 5. Fever to 102.2.  Possibly secondary to PNA. On Zosyn.  ZIMMERMAN,DONIELLE MPA-C 02/17/2015,10:00 AM  Probable aspiration pneumonia Agree with plan to hold on closed reduction of dislocation R clavicular head . patient examined and medical record reviewed,agree with above note. Tharon Aquas Trigt III 02/17/2015

## 2015-02-18 ENCOUNTER — Inpatient Hospital Stay (HOSPITAL_COMMUNITY): Payer: Medicare Other

## 2015-02-18 LAB — BASIC METABOLIC PANEL
Anion gap: 4 — ABNORMAL LOW (ref 5–15)
BUN: 37 mg/dL — AB (ref 6–23)
CALCIUM: 8.9 mg/dL (ref 8.4–10.5)
CO2: 23 mmol/L (ref 19–32)
Chloride: 117 mmol/L — ABNORMAL HIGH (ref 96–112)
Creatinine, Ser: 1.75 mg/dL — ABNORMAL HIGH (ref 0.50–1.35)
GFR calc Af Amer: 40 mL/min — ABNORMAL LOW (ref >90)
GFR, EST NON AFRICAN AMERICAN: 35 mL/min — AB (ref >90)
GLUCOSE: 177 mg/dL — AB (ref 70–99)
Potassium: 4.7 mmol/L (ref 3.5–5.1)
Sodium: 144 mmol/L (ref 135–145)

## 2015-02-18 LAB — CULTURE, RESPIRATORY W GRAM STAIN: Special Requests: NORMAL

## 2015-02-18 LAB — CBC WITH DIFFERENTIAL/PLATELET
BASOS PCT: 0 % (ref 0–1)
Basophils Absolute: 0 10*3/uL (ref 0.0–0.1)
EOS PCT: 0 % (ref 0–5)
Eosinophils Absolute: 0 10*3/uL (ref 0.0–0.7)
HEMATOCRIT: 29.8 % — AB (ref 39.0–52.0)
Hemoglobin: 9.2 g/dL — ABNORMAL LOW (ref 13.0–17.0)
LYMPHS PCT: 11 % — AB (ref 12–46)
Lymphs Abs: 0.9 10*3/uL (ref 0.7–4.0)
MCH: 28.2 pg (ref 26.0–34.0)
MCHC: 30.9 g/dL (ref 30.0–36.0)
MCV: 91.4 fL (ref 78.0–100.0)
Monocytes Absolute: 0.7 10*3/uL (ref 0.1–1.0)
Monocytes Relative: 9 % (ref 3–12)
Neutro Abs: 6.4 10*3/uL (ref 1.7–7.7)
Neutrophils Relative %: 80 % — ABNORMAL HIGH (ref 43–77)
PLATELETS: 140 10*3/uL — AB (ref 150–400)
RBC: 3.26 MIL/uL — ABNORMAL LOW (ref 4.22–5.81)
RDW: 14.8 % (ref 11.5–15.5)
WBC: 8 10*3/uL (ref 4.0–10.5)

## 2015-02-18 LAB — GLUCOSE, CAPILLARY
GLUCOSE-CAPILLARY: 143 mg/dL — AB (ref 70–99)
GLUCOSE-CAPILLARY: 151 mg/dL — AB (ref 70–99)
GLUCOSE-CAPILLARY: 154 mg/dL — AB (ref 70–99)
Glucose-Capillary: 140 mg/dL — ABNORMAL HIGH (ref 70–99)
Glucose-Capillary: 129 mg/dL — ABNORMAL HIGH (ref 70–99)
Glucose-Capillary: 148 mg/dL — ABNORMAL HIGH (ref 70–99)

## 2015-02-18 LAB — TRIGLYCERIDES: TRIGLYCERIDES: 142 mg/dL (ref <150)

## 2015-02-18 LAB — CULTURE, RESPIRATORY

## 2015-02-18 NOTE — Progress Notes (Signed)
Follow up - Trauma and Critical Care  Patient Details:    Brendan Hall is an 81 y.o. male.  Lines/tubes : Airway 8 mm (Active)  Secured at (cm) 23 cm 02/18/2015  7:30 AM  Measured From Lips 02/18/2015  7:30 AM  Secured Location Right 02/18/2015  7:30 AM  Secured By [employer] 02/18/2015  7:30 AM  Tube Holder Repositioned Yes 02/18/2015  7:30 AM  Cuff Pressure (cm H2O) 26 cm H2O 02/17/2015  8:33 PM  Site Condition Dry 02/18/2015  7:30 AM     CVC Triple Lumen 01/24/2015 Left Subclavian (Active)  Indication for Insertion or Continuance of Line Prolonged intravenous therapies 02/18/2015  8:00 AM  Site Assessment Clean;Dry;Intact 02/18/2015  8:00 AM  Proximal Lumen Status Infusing;Flushed;Blood return noted 02/18/2015  8:00 AM  Medial Infusing;Flushed;Blood return noted 02/18/2015  8:00 AM  Distal Lumen Status Infusing;Flushed;Blood return noted 02/18/2015  8:00 AM  Dressing Type Transparent;Non-occlusive 02/18/2015  8:00 AM  Dressing Status Clean;Dry;Intact;Antimicrobial disc in place 02/18/2015  8:00 AM  Line Care Connections checked and tightened;Line pulled back;Zeroed and calibrated;Leveled 02/18/2015  8:00 AM  Dressing Change Due 02/23/15 02/17/2015  8:00 PM     Arterial Line 02/23/2015 Left Radial (Active)  Site Assessment Clean;Dry 02/18/2015  8:00 AM  Line Status Pulsatile blood flow 02/18/2015  8:00 AM  Art Line Waveform Dampened 02/18/2015  8:00 AM  Art Line Interventions Zeroed and calibrated;Leveled;Connections checked and tightened;Flushed per protocol 02/18/2015  8:00 AM  Color/Movement/Sensation Capillary refill less than 3 sec 02/18/2015  8:00 AM  Dressing Type Transparent;Occlusive 02/18/2015  8:00 AM  Dressing Status Clean;Dry;Intact 02/18/2015  8:00 AM  Interventions New dressing 02/17/2015  8:00 PM  Dressing Change Due March 04, 2015 02/17/2015  8:00 PM     NG/OG Tube Orogastric 16 Fr. Right mouth (Active)  Placement Verification Auscultation 02/18/2015  8:00 AM  Site Assessment  Dry;Intact 02/18/2015  8:00 AM  Status Infusing tube feed 02/18/2015  8:00 AM  Drainage Appearance Tan 02/18/2015  8:00 AM  Gastric Residual 15 mL 02/18/2015  8:00 AM  Output (mL) 250 mL 02/17/2015  4:00 AM     Urethral Catheter [name], NT Temperature probe 16 Fr. (Active)  Indication for Insertion or Continuance of Catheter Unstable critical patients (first 24-48 hours) 02/18/2015  8:00 AM  Site Assessment Clean;Intact 02/18/2015  8:00 AM  Catheter Maintenance Bag below level of bladder;Catheter secured;Drainage bag/tubing not touching floor;Seal intact 02/18/2015  8:00 AM  Collection Container Standard drainage bag 02/18/2015  8:00 AM  Securement Method Leg strap 02/18/2015  8:00 AM  Urinary Catheter Interventions Unclamped 02/18/2015  8:00 AM  Output (mL) 30 mL 02/18/2015 12:00 PM    Microbiology/Sepsis markers: Results for orders placed or performed during the hospital encounter of 02/10/2015  Culture, respiratory (NON-Expectorated)     Status: None   Collection Time: 02/19/2015 12:20 AM  Result Value Ref Range Status   Specimen Description TRACHEAL ASPIRATE  Final   Special Requests Normal  Final   Gram Stain   Final    ABUNDANT WBC PRESENT, PREDOMINANTLY PMN NO SQUAMOUS EPITHELIAL CELLS SEEN ABUNDANT GRAM NEGATIVE COCCI FEW GRAM NEGATIVE RODS RARE GRAM POSITIVE COCCI    Culture   Final    MODERATE MORAXELLA CATARRHALIS(BRANHAMELLA) Note: BETA LACTAMASE POSITIVE Performed at [facility]    Report Status 02/18/2015 FINAL  Final  Culture, bal-quantitative     Status: None (Preliminary result)   Collection Time: 02/17/15 11:20 AM  Result Value Ref Range Status  Specimen Description BRONCHIAL ALVEOLAR LAVAGE  Final   Special Requests Normal  Final   Gram Stain   Final    FEW WBC PRESENT,BOTH PMN AND MONONUCLEAR NO SQUAMOUS EPITHELIAL CELLS SEEN NO ORGANISMS SEEN Performed at [facility] Count PENDING  Incomplete   Culture   Final    Culture  reincubated for better growth Performed at [facility]    Report Status PENDING  Incomplete    Anti-infectives:  Anti-infectives    Start     Dose/Rate Route Frequency Ordered Stop   01/27/2015 1945  piperacillin-tazobactam (ZOSYN) IVPB 3.375 g     3.375 g 12.5 mL/hr over 240 Minutes Intravenous Every 8 hours 01/30/2015 1944     01/30/2015 1530  ceFAZolin (ANCEF) IVPB 1 g/50 mL premix  Status:  Discontinued     1 g 100 mL/hr over 30 Minutes Intravenous 3 times per day 02/23/2015 1506 01/28/2015 1919   02/21/2015 2200  ceFAZolin (ANCEF) IVPB 1 g/50 mL premix  Status:  Discontinued     1 g 100 mL/hr over 30 Minutes Intravenous 3 times per day 01/27/2015 1804 02/06/2015 1506      Best Practice/Protocols:  VTE Prophylaxis: Lovenox (prophylaxtic dose) Continous Sedation  Consults: Treatment Team:  Ivin Poot, MD    Events:  Subjective:    Overnight Issues: On vent awake was not able to surgery yet  Objective:  Vital signs for last 24 hours: Temp:  [98.8 F (37.1 C)-100.6 F (38.1 C)] 99.9 F (37.7 C) (03/26 1153) Pulse Rate:  [75-94] 91 (03/26 1153) Resp:  [10-22] 22 (03/26 1153) BP: (92-129)/(46-80) 106/73 mmHg (03/26 1153) SpO2:  [92 %-100 %] 100 % (03/26 1153) Arterial Line BP: (75-117)/(39-56) 109/51 mmHg (03/26 1100) FiO2 (%):  [40 %] 40 % (03/26 1153)  Hemodynamic parameters for last 24 hours: CVP:  [6 mmHg-19 mmHg] 14 mmHg  Intake/Output from previous day: 03/25 0701 - 03/26 0700 In: 3221.2 [I.V.:2731.2; NG/GT:340; IV Piggyback:150] Out: 0488 [Urine:1060]  Intake/Output this shift: Total I/O In: 613.3 [I.V.:533.3; NG/GT:80] Out: 380 [Urine:380]  Vent settings for last 24 hours: Vent Mode:  [-] PRVC FiO2 (%):  [40 %] 40 % Set Rate:  [22 bmp] 22 bmp Vt Set:  [570 mL] 570 mL PEEP:  [5 cmH20] 5 cmH20 Plateau Pressure:  [20 cmH20-25 cmH20] 25 cmH20  Physical Exam:  General: on vent opens eyes Neuro: sedated opens eyes Resp: diminished breath sounds  bilaterally CVS: regular rate and rhythm, S1, S2 normal, no murmur, click, rub or gallop GI: soft, nontender, BS WNL, no r/g  Results for orders placed or performed during the hospital encounter of 02/09/2015 (from the past 24 hour(s))  Glucose, capillary     Status: Abnormal   Collection Time: 02/17/15  3:05 PM  Result Value Ref Range   Glucose-Capillary 102 (H) 70 - 99 mg/dL   Comment 1 Notify RN    Comment 2 Document in Chart   Glucose, capillary     Status: Abnormal   Collection Time: 02/17/15  7:39 PM  Result Value Ref Range   Glucose-Capillary 117 (H) 70 - 99 mg/dL  Glucose, capillary     Status: Abnormal   Collection Time: 02/17/15 11:31 PM  Result Value Ref Range   Glucose-Capillary 152 (H) 70 - 99 mg/dL  Glucose, capillary     Status: Abnormal   Collection Time: 02/18/15  3:42 AM  Result Value Ref Range   Glucose-Capillary 140 (H) 70 - 99  mg/dL  CBC with Differential/Platelet     Status: Abnormal   Collection Time: 02/18/15  5:30 AM  Result Value Ref Range   WBC 8.0 4.0 - 10.5 K/uL   RBC 3.26 (L) 4.22 - 5.81 MIL/uL   Hemoglobin 9.2 (L) 13.0 - 17.0 g/dL   HCT 29.8 (L) 39.0 - 52.0 %   MCV 91.4 78.0 - 100.0 fL   MCH 28.2 26.0 - 34.0 pg   MCHC 30.9 30.0 - 36.0 g/dL   RDW 14.8 11.5 - 15.5 %   Platelets 140 (L) 150 - 400 K/uL   Neutrophils Relative % 80 (H) 43 - 77 %   Neutro Abs 6.4 1.7 - 7.7 K/uL   Lymphocytes Relative 11 (L) 12 - 46 %   Lymphs Abs 0.9 0.7 - 4.0 K/uL   Monocytes Relative 9 3 - 12 %   Monocytes Absolute 0.7 0.1 - 1.0 K/uL   Eosinophils Relative 0 0 - 5 %   Eosinophils Absolute 0.0 0.0 - 0.7 K/uL   Basophils Relative 0 0 - 1 %   Basophils Absolute 0.0 0.0 - 0.1 K/uL  Basic metabolic panel     Status: Abnormal   Collection Time: 02/18/15  5:30 AM  Result Value Ref Range   Sodium 144 135 - 145 mmol/L   Potassium 4.7 3.5 - 5.1 mmol/L   Chloride 117 (H) 96 - 112 mmol/L   CO2 23 19 - 32 mmol/L   Glucose, Bld 177 (H) 70 - 99 mg/dL   BUN 37 (H) 6 - 23  mg/dL   Creatinine, Ser 1.75 (H) 0.50 - 1.35 mg/dL   Calcium 8.9 8.4 - 10.5 mg/dL   GFR calc non Af Amer 35 (L) >90 mL/min   GFR calc Af Amer 40 (L) >90 mL/min   Anion gap 4 (L) 5 - 15  Glucose, capillary     Status: Abnormal   Collection Time: 02/18/15  8:31 AM  Result Value Ref Range   Glucose-Capillary 129 (H) 70 - 99 mg/dL     Assessment/Plan:  Run over by tractor Ventilator dependent respiratory failure - on vent but has not had sternoclavicular dislocation addressed.  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 Iran Planas, visual exam once extubated VTE - Lovenox in AM if Hb OK DIspo - ICU LOS: 2 days   Additional comments:I reviewed the patient's new clinical lab test results. cbc cmet and I reviewed the patients new imaging test results. cxr  Critical Care Total Time*: 30 Minutes  Keno Caraway A. 02/18/2015  *Care during the described time interval was provided by me and/or other providers on the critical care team.  I have reviewed this patient's available data, including medical history, events of note, physical examination and test results as part of my evaluation.

## 2015-02-19 ENCOUNTER — Inpatient Hospital Stay (HOSPITAL_COMMUNITY): Payer: Medicare Other

## 2015-02-19 LAB — GLUCOSE, CAPILLARY
GLUCOSE-CAPILLARY: 149 mg/dL — AB (ref 70–99)
GLUCOSE-CAPILLARY: 134 mg/dL — AB (ref 70–99)
GLUCOSE-CAPILLARY: 128 mg/dL — AB (ref 70–99)
Glucose-Capillary: 132 mg/dL — ABNORMAL HIGH (ref 70–99)
Glucose-Capillary: 134 mg/dL — ABNORMAL HIGH (ref 70–99)
Glucose-Capillary: 124 mg/dL — ABNORMAL HIGH (ref 70–99)

## 2015-02-19 MED ORDER — ENOXAPARIN SODIUM 40 MG/0.4ML ~~LOC~~ SOLN
40.0000 mg | SUBCUTANEOUS | Status: DC
  Administered 2015-02-19 – 2015-02-23 (×5): 40 mg via SUBCUTANEOUS
  Filled 2015-02-19 (×5): qty 0.4

## 2015-02-19 NOTE — Progress Notes (Signed)
[facility] Progress Note  3 Days Post-Op  Subjective: Pt intubated, sedated on vent.  Still requiring full support.  Does follow commands (wiggles toes and squeezes my hand with right).  Objective: Vital signs in last 24 hours: Temp:  [99.3 F (37.4 C)-100.8 F (38.2 C)] 100.6 F (38.1 C) (03/27 1100) Pulse Rate:  [74-119] 84 (03/27 1100) Resp:  [14-25] 19 (03/27 1100) BP: (87-138)/(48-73) 87/64 mmHg (03/27 1100) SpO2:  [94 %-100 %] 100 % (03/27 1100) Arterial Line BP: (71-118)/(44-66) 97/48 mmHg (03/27 1100) FiO2 (%):  [40 %] 40 % (03/27 1100) Last BM Date:  (Prior admission)  Intake/Output from previous day: 03/26 0701 - 03/27 0700 In: 2918.3 [I.V.:2255.8; NG/GT:500; IV Piggyback:162.5] Out: 1690 [Urine:1690] Intake/Output this shift: Total I/O In: 569.4 [I.V.:489.4; NG/GT:80] Out: 325 [Urine:325]   PE: General: WD/WN AA male who is laying in bed on full vent support, appears grossly edematous HEENT: head is normocephalic, atraumatic.  Sclera are noninjected.  Pupils equal pinpoint.  Ears and nose without any masses or lesions.  Mouth is pink and moist, ET tube in place Heart: regular, rate, and rhythm.  Normal s1,s2. No obvious murmurs, gallops, or rubs noted.  Palpable radial and pedal pulses bilaterally Lungs: Course breath sounds b/l Abd: soft, mild distension, NT, +BS, no masses, hernias, or organomegaly MS: Moves b/l LE toes and right hand squeezes mine, He does not currently move his left hand for me Skin: warm and dry, grossly edematous Psych: alert, follows some commands   Lab Results:   Recent Labs  02/17/15 0349 02/18/15 0530  WBC 9.4 8.0  HGB 9.2* 9.2*  HCT 30.2* 29.8*  PLT 140* 140*   BMET  Recent Labs  02/17/15 0349 02/18/15 0530  NA 142 144  K 5.1 4.7  CL 114* 117*  CO2 22 23  GLUCOSE 98 177*  BUN 31* 37*  CREATININE 1.85* 1.75*  CALCIUM 8.6 8.9   PT/INR No results for input(s): LABPROT, INR in the last 72  hours. CMP     Component Value Date/Time   NA 144 02/18/2015 0530   K 4.7 02/18/2015 0530   CL 117* 02/18/2015 0530   CO2 23 02/18/2015 0530   GLUCOSE 177* 02/18/2015 0530   BUN 37* 02/18/2015 0530   CREATININE 1.75* 02/18/2015 0530   CALCIUM 8.9 02/18/2015 0530   PROT 5.9* 01/25/2015 0302   ALBUMIN 3.0* 02/14/2015 0302   AST 31 01/24/2015 0302   ALT 20 01/25/2015 0302   ALKPHOS 60 02/09/2015 0302   BILITOT 1.1 02/23/2015 0302   GFRNONAA 35* 02/18/2015 0530   GFRAA 40* 02/18/2015 0530   Lipase  No results found for: LIPASE     Studies/Results: Dg Chest Port 1 View  02/18/2015   CLINICAL DATA:  Follow-up pneumonia.  Subsequent encounter.  EXAM: PORTABLE CHEST - 1 VIEW  COMPARISON:  Chest radiograph performed 02/17/2015  FINDINGS: The patient's endotracheal tube is seen ending 3 cm above the carina. An enteric tube is noted extending below the diaphragm, with the side port noted about the gastroesophageal junction. A left subclavian line is noted ending overlying the proximal SVC.  There is persistent right-sided volume loss. A small right pleural effusion is noted, with persistent right basilar airspace opacification. The patient's right hilar lung mass again noted. The left lung appears relatively clear. No pneumothorax is seen.  The cardiomediastinal silhouette is borderline normal in size. Multiple acute left-sided rib fractures are again seen.  IMPRESSION: 1. Endotracheal tube seen ending 3  cm above the carina. 2. Enteric tube extends below the diaphragm, with the side port seen about the gastroesophageal junction. 3. Persistent right-sided volume loss. Small right pleural effusion, with persistent right basilar airspace opacification, which may reflect pneumonia, superimposed on the patient's known right hilar lung mass. 4. Multiple healed left-sided rib fractures again noted.   Electronically Signed   By: Garald Balding M.D.   On: 02/18/2015 07:53     Anti-infectives: Anti-infectives    Start     Dose/Rate Route Frequency Ordered Stop   02/08/2015 1945  piperacillin-tazobactam (ZOSYN) IVPB 3.375 g     3.375 g 12.5 mL/hr over 240 Minutes Intravenous Every 8 hours 02/19/2015 1944     01/25/2015 1530  ceFAZolin (ANCEF) IVPB 1 g/50 mL premix  Status:  Discontinued     1 g 100 mL/hr over 30 Minutes Intravenous 3 times per day 02/17/2015 1506 02/10/2015 1919   01/30/2015 2200  ceFAZolin (ANCEF) IVPB 1 g/50 mL premix  Status:  Discontinued     1 g 100 mL/hr over 30 Minutes Intravenous 3 times per day 01/30/2015 1804 01/24/2015 1506       Assessment/Plan Run over by tractor Ventilator dependent respiratory failure - on vent but has not had sternoclavicular dislocation addressed. He is weaning, but fear collapse if this is not repaired prior to extubation Mult B rib FXs - no HPTX on CXR yesterday, repeat today R anterior sternoclavicular dislocation - await repair, on hold secondary to being unstable Nasal FX and R orbital floor FX - per Dr. 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 02/19/2015, 11:23 AM Pager: [phone]

## 2015-02-20 LAB — GLUCOSE, CAPILLARY
GLUCOSE-CAPILLARY: 143 mg/dL — AB (ref 70–99)
GLUCOSE-CAPILLARY: 151 mg/dL — AB (ref 70–99)
GLUCOSE-CAPILLARY: 164 mg/dL — AB (ref 70–99)
Glucose-Capillary: 133 mg/dL — ABNORMAL HIGH (ref 70–99)
Glucose-Capillary: 152 mg/dL — ABNORMAL HIGH (ref 70–99)

## 2015-02-20 LAB — CULTURE, BAL-QUANTITATIVE W GRAM STAIN: Colony Count: >=100000

## 2015-02-20 LAB — CULTURE, BAL-QUANTITATIVE: SPECIAL REQUESTS: NORMAL

## 2015-02-20 MED ORDER — DEXMEDETOMIDINE HCL IN NACL 400 MCG/100ML IV SOLN
0.4000 ug/kg/h | INTRAVENOUS | Status: DC
  Administered 2015-02-20 – 2015-02-22 (×7): 1.2 ug/kg/h via INTRAVENOUS
  Administered 2015-02-23: 1 ug/kg/h via INTRAVENOUS
  Filled 2015-02-20 (×16): qty 100

## 2015-02-20 MED ORDER — CEFTRIAXONE SODIUM IN DEXTROSE 40 MG/ML IV SOLN
2.0000 g | INTRAVENOUS | Status: DC
  Administered 2015-02-20: 2 g via INTRAVENOUS
  Filled 2015-02-20: qty 50

## 2015-02-20 MED ORDER — PROPOFOL 10 MG/ML IV EMUL
0.0000 ug/kg/min | INTRAVENOUS | Status: DC
  Administered 2015-02-20: 12.005 ug/kg/min via INTRAVENOUS

## 2015-02-20 MED ORDER — DEXMEDETOMIDINE HCL IN NACL 200 MCG/50ML IV SOLN: 0.0000 ug/kg/h | INTRAVENOUS | Status: DC

## 2015-02-20 MED ORDER — CIPROFLOXACIN IN D5W 400 MG/200ML IV SOLN
400.0000 mg | Freq: Two times a day (BID) | INTRAVENOUS | Status: DC
  Administered 2015-02-20 – 2015-02-23 (×7): 400 mg via INTRAVENOUS
  Filled 2015-02-20 (×9): qty 200

## 2015-02-20 MED ORDER — DEXMEDETOMIDINE HCL IN NACL 200 MCG/50ML IV SOLN
0.0000 ug/kg/h | INTRAVENOUS | Status: DC
  Administered 2015-02-20: 1.2 ug/kg/h via INTRAVENOUS
  Administered 2015-02-20: 0.1 ug/kg/h via INTRAVENOUS
  Administered 2015-02-21 – 2015-02-22 (×7): 1.2 ug/kg/h via INTRAVENOUS
  Filled 2015-02-20 (×2): qty 50

## 2015-02-20 MED ORDER — FENTANYL BOLUS VIA INFUSION
25.0000 ug | INTRAVENOUS | Status: DC | PRN
  Filled 2015-02-20: qty 50

## 2015-02-20 MED ORDER — DEXMEDETOMIDINE HCL IN NACL 200 MCG/50ML IV SOLN: 0.4000 ug/kg/h | INTRAVENOUS | Status: DC

## 2015-02-20 NOTE — Progress Notes (Addendum)
Patient ID: Brendan Hall, male   DOB: [birth year], 81 y.o.   MRN: [id number] Follow up - Trauma Critical Care  Patient Details:    Brendan Hall is an 81 y.o. male.  Lines/tubes : Airway 8 mm (Active)  Secured at (cm) 23 cm 02/20/2015  3:29 AM  Measured From Lips 02/20/2015  3:29 AM  Secured Location Right 02/20/2015  3:29 AM  Secured By [employer] 02/20/2015  3:29 AM  Tube Holder Repositioned Yes 02/20/2015  3:29 AM  Cuff Pressure (cm H2O) 22 cm H2O 02/19/2015  8:07 PM  Site Condition Dry 02/20/2015  3:29 AM     CVC Triple Lumen 01/24/2015 Left Subclavian (Active)  Indication for Insertion or Continuance of Line Prolonged intravenous therapies 02/20/2015  7:45 AM  Site Assessment Clean;Dry;Intact 02/20/2015  7:45 AM  Proximal Lumen Status Infusing 02/20/2015  7:45 AM  Medial Infusing 02/20/2015  7:45 AM  Distal Lumen Status Other (Comment) 02/20/2015  7:45 AM  Dressing Type Transparent 02/20/2015  7:45 AM  Dressing Status Clean;Dry;Intact 02/20/2015  7:45 AM  Line Care Connections checked and tightened 02/20/2015  7:45 AM  Dressing Change Due 02/23/15 02/18/2015  8:00 PM     NG/OG Tube Orogastric 16 Fr. Right mouth (Active)  Placement Verification Auscultation 02/20/2015  7:45 AM  Site Assessment Dry;Intact 02/20/2015  7:45 AM  Status Infusing tube feed 02/20/2015  7:45 AM  Drainage Appearance Tan 02/19/2015  8:00 PM  Gastric Residual 20 mL 02/20/2015 12:00 AM  Intake (mL) 30 mL 02/19/2015  8:00 PM  Output (mL) 250 mL 02/17/2015  4:00 AM     Urethral Catheter [name], NT Temperature probe 16 Fr. (Active)  Indication for Insertion or Continuance of Catheter Unstable spinal/crush injuries 02/20/2015 12:00 AM  Site Assessment Clean;Intact 02/20/2015 12:00 AM  Catheter Maintenance Bag below level of bladder;Catheter secured;Seal intact 02/20/2015  7:41 AM  Collection Container Standard drainage bag 02/20/2015 12:00 AM  Securement Method Leg strap 02/20/2015 12:00 AM  Urinary Catheter  Interventions Unclamped 02/20/2015 12:00 AM  Output (mL) 210 mL 02/20/2015  6:59 AM    Microbiology/Sepsis markers: Results for orders placed or performed during the hospital encounter of 02/07/2015  Culture, respiratory (NON-Expectorated)     Status: None   Collection Time: 02/12/2015 12:20 AM  Result Value Ref Range Status   Specimen Description TRACHEAL ASPIRATE  Final   Special Requests Normal  Final   Gram Stain   Final    ABUNDANT WBC PRESENT, PREDOMINANTLY PMN NO SQUAMOUS EPITHELIAL CELLS SEEN ABUNDANT GRAM NEGATIVE COCCI FEW GRAM NEGATIVE RODS RARE GRAM POSITIVE COCCI    Culture   Final    MODERATE MORAXELLA CATARRHALIS(BRANHAMELLA) Note: BETA LACTAMASE POSITIVE Performed at [facility]    Report Status 02/18/2015 FINAL  Final  Culture, bal-quantitative     Status: None (Preliminary result)   Collection Time: 02/17/15 11:20 AM  Result Value Ref Range Status   Specimen Description BRONCHIAL ALVEOLAR LAVAGE  Final   Special Requests Normal  Final   Gram Stain   Final    FEW WBC PRESENT,BOTH PMN AND MONONUCLEAR NO SQUAMOUS EPITHELIAL CELLS SEEN NO ORGANISMS SEEN Performed at [place]   Final    >=100,000 COLONIES/ML Performed at [facility]    Culture   Final    [place] Performed at [facility]    Report Status PENDING  Incomplete    Anti-infectives:  Anti-infectives    Start  Dose/Rate Route Frequency Ordered Stop   02/20/2015 1945  piperacillin-tazobactam (ZOSYN) IVPB 3.375 g     3.375 g 12.5 mL/hr over 240 Minutes Intravenous Every 8 hours 02/19/2015 1944     02/04/2015 1530  ceFAZolin (ANCEF) IVPB 1 g/50 mL premix  Status:  Discontinued     1 g 100 mL/hr over 30 Minutes Intravenous 3 times per day 01/24/2015 1506 01/27/2015 1919   01/25/2015 2200  ceFAZolin (ANCEF) IVPB 1 g/50 mL premix  Status:  Discontinued     1 g 100 mL/hr over 30 Minutes Intravenous 3 times per day 02/18/2015 1804 02/04/2015 1506       Best Practice/Protocols:  VTE Prophylaxis: Lovenox (prophylaxtic dose) and Mechanical Continous Sedation  Consults: Treatment Team:  Ivin Poot, MD   Subjective:    Overnight Issues:  Has been off levophed, BP still drops with propofol Objective:  Vital signs for last 24 hours: Temp:  [99.3 F (37.4 C)-100.8 F (38.2 C)] 99.7 F (37.6 C) (03/28 0700) Pulse Rate:  [72-106] 106 (03/28 0329) Resp:  [17-28] 24 (03/28 0700) BP: (87-120)/(49-92) 93/64 mmHg (03/28 0700) SpO2:  [94 %-100 %] 94 % (03/28 0756) Arterial Line BP: (71-108)/(46-66) 108/51 mmHg (03/27 1430) FiO2 (%):  [30 %-40 %] 30 % (03/28 0756) Weight:  [83.3 kg (183 lb 10.3 oz)] 83.3 kg (183 lb 10.3 oz) (03/28 0300)  Hemodynamic parameters for last 24 hours: CVP:  [10 mmHg-19 mmHg] 17 mmHg  Intake/Output from previous day: 03/27 0701 - 03/28 0700 In: 3636 [I.V.:3063.6; NG/GT:510; IV Piggyback:62.4] Out: 1545 [Urine:1545]  Intake/Output this shift:    Vent settings for last 24 hours: Vent Mode:  [-] PRVC FiO2 (%):  [30 %-40 %] 30 % Set Rate:  [22 bmp] 22 bmp Vt Set:  [570 mL] 570 mL PEEP:  [5 cmH20] 5 cmH20 Plateau Pressure:  [20 cmH20-25 cmH20] 25 cmH20  Physical Exam:  General: on vent Neuro: sedated but F/C UE and LE HEENT/Neck: ETT and collar Resp: mild rhonchi and exp wheeze B CVS: RRR 115 S/P suctioning GI: soft, NT, ND, +BS Extremities: no edema  Results for orders placed or performed during the hospital encounter of 01/27/2015 (from the past 24 hour(s))  Glucose, capillary     Status: Abnormal   Collection Time: 02/19/15 11:48 AM  Result Value Ref Range   Glucose-Capillary 149 (H) 70 - 99 mg/dL  Glucose, capillary     Status: Abnormal   Collection Time: 02/19/15  4:19 PM  Result Value Ref Range   Glucose-Capillary 134 (H) 70 - 99 mg/dL  Glucose, capillary     Status: Abnormal   Collection Time: 02/19/15  8:13 PM  Result Value Ref Range   Glucose-Capillary 128 (H) 70 - 99 mg/dL   Glucose, capillary     Status: Abnormal   Collection Time: 02/19/15 11:29 PM  Result Value Ref Range   Glucose-Capillary 124 (H) 70 - 99 mg/dL  Glucose, capillary     Status: Abnormal   Collection Time: 02/20/15  3:34 AM  Result Value Ref Range   Glucose-Capillary 143 (H) 70 - 99 mg/dL    Assessment & Plan: Present on Admission:  **None**   LOS: 4 days   Additional comments:I reviewed the patient's new clinical lab test results. . Run over by tractor Ventilator dependent respiratory failure - on vent but has not had sternoclavicular dislocation or diagnostic bronch done yet. Will wean for now. Mult B rib FXs - moderate R effusion 3/27. CXR in AM  R anterior sternoclavicular dislocation - stable for repair at this time. Will D/W Dr. Marlou Sa and Dr. Nils Pyle Nasal FX and R orbital floor FX - per Dr. 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 Change sedation to Precedex to see if he tolerates better. Watch HR. DM - SSI ID - Moraxella PNA, beta lactam positive so change Zosyn to Rocephin VTE - Lovenox FEN - on TF 75mL/hr, advance to goal Dispo - cont ICU on vent Critical Care Total Time*: 41 Minutes  Georganna Skeans, MD, MPH, FACS Trauma: [phone] General Surgery: [phone]  02/20/2015  *Care during the described time interval was provided by me. I have reviewed this patient's available data, including medical history, events of note, physical examination and test results as part of my evaluation.
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
## 2015-02-21 ENCOUNTER — Inpatient Hospital Stay (HOSPITAL_COMMUNITY): Payer: Medicare Other

## 2015-02-21 LAB — GLUCOSE, CAPILLARY
GLUCOSE-CAPILLARY: 183 mg/dL — AB (ref 70–99)
GLUCOSE-CAPILLARY: 189 mg/dL — AB (ref 70–99)
GLUCOSE-CAPILLARY: 212 mg/dL — AB (ref 70–99)
Glucose-Capillary: 141 mg/dL — ABNORMAL HIGH (ref 70–99)
Glucose-Capillary: 152 mg/dL — ABNORMAL HIGH (ref 70–99)
Glucose-Capillary: 165 mg/dL — ABNORMAL HIGH (ref 70–99)
Glucose-Capillary: 166 mg/dL — ABNORMAL HIGH (ref 70–99)
Glucose-Capillary: 173 mg/dL — ABNORMAL HIGH (ref 70–99)

## 2015-02-21 LAB — BASIC METABOLIC PANEL
Anion gap: 9 (ref 5–15)
BUN: 37 mg/dL — AB (ref 6–23)
CHLORIDE: 121 mmol/L — AB (ref 96–112)
CO2: 20 mmol/L (ref 19–32)
Calcium: 9 mg/dL (ref 8.4–10.5)
Creatinine, Ser: 1.42 mg/dL — ABNORMAL HIGH (ref 0.50–1.35)
GFR calc non Af Amer: 44 mL/min — ABNORMAL LOW (ref >90)
GFR, EST AFRICAN AMERICAN: 52 mL/min — AB (ref >90)
GLUCOSE: 209 mg/dL — AB (ref 70–99)
Potassium: 4.8 mmol/L (ref 3.5–5.1)
Sodium: 150 mmol/L — ABNORMAL HIGH (ref 135–145)

## 2015-02-21 LAB — CBC
HCT: 30.7 % — ABNORMAL LOW (ref 39.0–52.0)
Hemoglobin: 9.5 g/dL — ABNORMAL LOW (ref 13.0–17.0)
MCH: 28.5 pg (ref 26.0–34.0)
MCHC: 30.9 g/dL (ref 30.0–36.0)
MCV: 92.2 fL (ref 78.0–100.0)
Platelets: 208 10*3/uL (ref 150–400)
RBC: 3.33 MIL/uL — ABNORMAL LOW (ref 4.22–5.81)
RDW: 15.3 % (ref 11.5–15.5)
WBC: 8.8 10*3/uL (ref 4.0–10.5)

## 2015-02-21 MED ORDER — PIVOT 1.5 CAL PO LIQD
1000.0000 mL | ORAL | Status: DC
  Filled 2015-02-21 (×4): qty 1000

## 2015-02-21 MED ORDER — INSULIN ASPART 100 UNIT/ML ~~LOC~~ SOLN
0.0000 [IU] | SUBCUTANEOUS | Status: DC
  Administered 2015-02-21 (×2): 3 [IU] via SUBCUTANEOUS
  Administered 2015-02-21: 5 [IU] via SUBCUTANEOUS
  Administered 2015-02-21: 2 [IU] via SUBCUTANEOUS
  Administered 2015-02-21: 3 [IU] via SUBCUTANEOUS
  Administered 2015-02-22: 5 [IU] via SUBCUTANEOUS
  Administered 2015-02-22 (×3): 3 [IU] via SUBCUTANEOUS
  Administered 2015-02-22 – 2015-02-23 (×2): 2 [IU] via SUBCUTANEOUS
  Administered 2015-02-23: 3 [IU] via SUBCUTANEOUS
  Administered 2015-02-23: 2 [IU] via SUBCUTANEOUS
  Administered 2015-02-23: 3 [IU] via SUBCUTANEOUS
  Administered 2015-02-23 (×2): 2 [IU] via SUBCUTANEOUS

## 2015-02-21 MED ORDER — FREE WATER
200.0000 mL | Freq: Four times a day (QID) | Status: DC
  Administered 2015-02-21 – 2015-02-22 (×5): 200 mL

## 2015-02-21 MED ORDER — METOCLOPRAMIDE HCL 5 MG/ML IJ SOLN
10.0000 mg | Freq: Four times a day (QID) | INTRAMUSCULAR | Status: DC
  Administered 2015-02-21 – 2015-02-24 (×11): 10 mg via INTRAVENOUS
  Filled 2015-02-21 (×16): qty 2

## 2015-02-21 MED ORDER — BISACODYL 10 MG RE SUPP
10.0000 mg | Freq: Every day | RECTAL | Status: DC
  Administered 2015-02-21 – 2015-02-23 (×3): 10 mg via RECTAL
  Filled 2015-02-21 (×3): qty 1
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
## 2015-02-22 ENCOUNTER — Inpatient Hospital Stay (HOSPITAL_COMMUNITY): Payer: Medicare Other

## 2015-02-22 DIAGNOSIS — I495 Sick sinus syndrome: Secondary | ICD-10-CM

## 2015-02-22 DIAGNOSIS — R001 Bradycardia, unspecified: Secondary | ICD-10-CM

## 2015-02-22 LAB — TROPONIN I
TROPONIN I: 0.1 ng/mL — AB (ref <0.031)
TROPONIN I: 0.13 ng/mL — AB (ref <0.031)
Troponin I: 0.06 ng/mL — ABNORMAL HIGH (ref <0.031)

## 2015-02-22 LAB — BASIC METABOLIC PANEL
Anion gap: 3 — ABNORMAL LOW (ref 5–15)
BUN: 37 mg/dL — AB (ref 6–23)
CHLORIDE: 126 mmol/L — AB (ref 96–112)
CO2: 22 mmol/L (ref 19–32)
Calcium: 8.8 mg/dL (ref 8.4–10.5)
Creatinine, Ser: 1.66 mg/dL — ABNORMAL HIGH (ref 0.50–1.35)
GFR calc non Af Amer: 37 mL/min — ABNORMAL LOW (ref >90)
GFR, EST AFRICAN AMERICAN: 43 mL/min — AB (ref >90)
GLUCOSE: 151 mg/dL — AB (ref 70–99)
POTASSIUM: 5.1 mmol/L (ref 3.5–5.1)
Sodium: 151 mmol/L — ABNORMAL HIGH (ref 135–145)

## 2015-02-22 LAB — POCT I-STAT 3, ART BLOOD GAS (G3+)
ACID-BASE DEFICIT: 10 mmol/L — AB (ref 0.0–2.0)
Acid-base deficit: 5 mmol/L — ABNORMAL HIGH (ref 0.0–2.0)
BICARBONATE: 17.5 meq/L — AB (ref 20.0–24.0)
Bicarbonate: 20.4 mEq/L (ref 20.0–24.0)
O2 SAT: 88 %
O2 Saturation: 86 %
PH ART: 7.215 — AB (ref 7.350–7.450)
PO2 ART: 59 mmHg — AB (ref 80.0–100.0)
PO2 ART: 58 mmHg — AB (ref 80.0–100.0)
Patient temperature: 98.9
TCO2: 19 mmol/L (ref 0–100)
TCO2: 22 mmol/L (ref 0–100)
pCO2 arterial: 42.9 mmHg (ref 35.0–45.0)
pCO2 arterial: 38.4 mmHg (ref 35.0–45.0)
pH, Arterial: 7.333 — ABNORMAL LOW (ref 7.350–7.450)

## 2015-02-22 LAB — GLUCOSE, CAPILLARY
GLUCOSE-CAPILLARY: 135 mg/dL — AB (ref 70–99)
GLUCOSE-CAPILLARY: 189 mg/dL — AB (ref 70–99)
Glucose-Capillary: 212 mg/dL — ABNORMAL HIGH (ref 70–99)
Glucose-Capillary: 177 mg/dL — ABNORMAL HIGH (ref 70–99)

## 2015-02-22 LAB — CBC
HCT: 30.4 % — ABNORMAL LOW (ref 39.0–52.0)
HEMOGLOBIN: 9.2 g/dL — AB (ref 13.0–17.0)
MCH: 28 pg (ref 26.0–34.0)
MCHC: 30.3 g/dL (ref 30.0–36.0)
MCV: 92.7 fL (ref 78.0–100.0)
Platelets: 213 10*3/uL (ref 150–400)
RBC: 3.28 MIL/uL — AB (ref 4.22–5.81)
RDW: 15.7 % — ABNORMAL HIGH (ref 11.5–15.5)
WBC: 7.7 10*3/uL (ref 4.0–10.5)

## 2015-02-22 LAB — TSH: TSH: 3.075 u[IU]/mL (ref 0.350–4.500)

## 2015-02-22 LAB — MAGNESIUM: Magnesium: 2 mg/dL (ref 1.5–2.5)

## 2015-02-22 MED ORDER — SODIUM BICARBONATE 8.4 % IV SOLN
100.0000 meq | Freq: Once | INTRAVENOUS | Status: DC
  Filled 2015-02-22: qty 100

## 2015-02-22 MED ORDER — SODIUM BICARBONATE 8.4 % IV SOLN
100.0000 meq | Freq: Once | INTRAVENOUS | Status: AC
  Administered 2015-02-22: 100 meq via INTRAVENOUS

## 2015-02-22 MED ORDER — DEXTROSE-NACL 5-0.45 % IV SOLN
INTRAVENOUS | Status: DC
  Administered 2015-02-22: 11:00:00 via INTRAVENOUS
  Filled 2015-02-22 (×2): qty 1000

## 2015-02-22 MED ORDER — DEXTROSE-NACL 5-0.45 % IV SOLN
INTRAVENOUS | Status: DC
  Administered 2015-02-22 – 2015-02-23 (×2): via INTRAVENOUS

## 2015-02-22 MED ORDER — SODIUM BICARBONATE 8.4 % IV SOLN
INTRAVENOUS | Status: AC
  Administered 2015-02-22: 100 meq via INTRAVENOUS
  Filled 2015-02-22: qty 100

## 2015-02-22 MED ORDER — SODIUM CHLORIDE 0.9 % IV BOLUS (SEPSIS)
500.0000 mL | Freq: Once | INTRAVENOUS | Status: AC
  Administered 2015-02-22: 500 mL via INTRAVENOUS
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
## 2015-02-22 NOTE — Progress Notes (Signed)
Patient ID: Brendan Hall, male   DOB: 1930/05/05, 81 y.o.   MRN: [id number] Follow up - Trauma Critical Care  Patient Details:    Brendan Hall is an 81 y.o. male.  Lines/tubes : Airway 8 mm (Active)  Secured at (cm) 23 cm 02/22/2015  7:38 AM  Measured From Lips 02/22/2015  7:38 AM  Secured Location Right 02/22/2015  7:38 AM  Secured By [employer] 02/22/2015  7:38 AM  Tube Holder Repositioned Yes 02/22/2015  7:38 AM  Cuff Pressure (cm H2O) 24 cm H2O 02/21/2015  7:28 PM  Site Condition Dry 02/21/2015  7:28 PM     CVC Triple Lumen 02/02/2015 Left Subclavian (Active)  Indication for Insertion or Continuance of Line Prolonged intravenous therapies 02/21/2015  9:00 PM  Site Assessment Clean;Dry;Intact 02/21/2015  9:00 PM  Proximal Lumen Status Infusing 02/21/2015  9:00 PM  Medial Infusing 02/21/2015  9:00 PM  Distal Lumen Status Flushed;Saline locked 02/21/2015  9:00 PM  Dressing Type Transparent 02/21/2015  9:00 PM  Dressing Status Clean;Dry;Intact 02/21/2015  9:00 PM  Line Care Connections checked and tightened;Line pulled back 02/21/2015  9:00 PM  Dressing Change Due 02/23/15 02/21/2015  8:00 PM     NG/OG Tube Orogastric 16 Fr. Right mouth (Active)  Placement Verification Auscultation 02/22/2015 12:00 AM  Site Assessment Dry;Intact 02/22/2015 12:00 AM  Status Infusing tube feed 02/22/2015 12:00 AM  Drainage Appearance Tan 02/22/2015 12:00 AM  Gastric Residual 250 mL 02/21/2015  4:00 PM  Intake (mL) 250 mL 02/21/2015  4:00 AM  Output (mL) 250 mL 02/17/2015  4:00 AM     Urethral Catheter [name], NT Temperature probe 16 Fr. (Active)  Indication for Insertion or Continuance of Catheter Unstable critical patients (first 24-48 hours) 02/22/2015  8:00 AM  Site Assessment Clean;Intact 02/22/2015 12:00 AM  Catheter Maintenance Bag below level of bladder;Catheter secured;Bag emptied prior to transport;Insertion date on drainage bag;No dependent loops;Seal intact;Drainage bag/tubing not  touching floor 02/22/2015  8:00 AM  Collection Container Standard drainage bag 02/22/2015 12:00 AM  Securement Method Leg strap 02/22/2015 12:00 AM  Urinary Catheter Interventions Unclamped 02/22/2015 12:00 AM  Output (mL) 25 mL 02/22/2015  6:00 AM    Microbiology/Sepsis markers: Results for orders placed or performed during the hospital encounter of 02/10/2015  Culture, respiratory (NON-Expectorated)     Status: None   Collection Time: 01/29/2015 12:20 AM  Result Value Ref Range Status   Specimen Description TRACHEAL ASPIRATE  Final   Special Requests Normal  Final   Gram Stain   Final    ABUNDANT WBC PRESENT, PREDOMINANTLY PMN NO SQUAMOUS EPITHELIAL CELLS SEEN ABUNDANT GRAM NEGATIVE COCCI FEW GRAM NEGATIVE RODS RARE GRAM POSITIVE COCCI    Culture   Final    MODERATE MORAXELLA CATARRHALIS(BRANHAMELLA) Note: BETA LACTAMASE POSITIVE Performed at [facility]    Report Status 02/18/2015 FINAL  Final  Culture, bal-quantitative     Status: None   Collection Time: 02/17/15 11:20 AM  Result Value Ref Range Status   Specimen Description BRONCHIAL ALVEOLAR LAVAGE  Final   Special Requests Normal  Final   Gram Stain   Final    FEW WBC PRESENT,BOTH PMN AND MONONUCLEAR NO SQUAMOUS EPITHELIAL CELLS SEEN NO ORGANISMS SEEN Performed at [facility]   Final    >=100,000 COLONIES/ML Performed at [place]   Final    ACINETOBACTER LWOFFII Performed at [facility]    Report Status 02/20/2015  FINAL  Final   Organism ID, Bacteria ACINETOBACTER LWOFFII  Final      Susceptibility   Acinetobacter lwoffii - MIC*    CEFEPIME <=1 SENSITIVE Sensitive     CEFTAZIDIME 4 SENSITIVE Sensitive     CEFTRIAXONE 16 INTERMEDIATE Intermediate     CIPROFLOXACIN <=0.25 SENSITIVE Sensitive     GENTAMICIN <=1 SENSITIVE Sensitive     IMIPENEM <=0.25 SENSITIVE Sensitive     TOBRAMYCIN <=1 SENSITIVE Sensitive     TRIMETH/SULFA <=20 SENSITIVE Sensitive      AMPICILLIN/SULBACTAM <=2 SENSITIVE Sensitive     * ACINETOBACTER LWOFFII    Anti-infectives:  Anti-infectives    Start     Dose/Rate Route Frequency Ordered Stop   02/20/15 1700  ciprofloxacin (CIPRO) IVPB 400 mg     400 mg 200 mL/hr over 60 Minutes Intravenous Every 12 hours 02/20/15 1555     02/20/15 0830  cefTRIAXone (ROCEPHIN) 2 g in dextrose 5 % 50 mL IVPB - Premix  Status:  Discontinued     2 g 100 mL/hr over 30 Minutes Intravenous Every 24 hours 02/20/15 0817 02/20/15 1555   02/21/2015 1945  piperacillin-tazobactam (ZOSYN) IVPB 3.375 g  Status:  Discontinued     3.375 g 12.5 mL/hr over 240 Minutes Intravenous Every 8 hours 01/25/2015 1944 02/20/15 0817   01/29/2015 1530  ceFAZolin (ANCEF) IVPB 1 g/50 mL premix  Status:  Discontinued     1 g 100 mL/hr over 30 Minutes Intravenous 3 times per day 02/22/2015 1506 02/21/2015 1919   02/23/2015 2200  ceFAZolin (ANCEF) IVPB 1 g/50 mL premix  Status:  Discontinued     1 g 100 mL/hr over 30 Minutes Intravenous 3 times per day 02/06/2015 1804 01/30/2015 1506      Best Practice/Protocols:  VTE Prophylaxis: Lovenox (prophylaxtic dose) and Mechanical Continous Sedation  Consults: Treatment Team:  Ivin Poot, MD    Studies:CXR - Vascular congestion, with mildly increased interstitial markings at the left lung base, raising concern for mild interstitial edema. Underlying right upper lobe lung mass and scarring again noted.  Subjective:    Overnight Issues:  Vomited so OGT placed to LIWS Objective:  Vital signs for last 24 hours: Temp:  [96.8 F (36 C)-100 F (37.8 C)] 100 F (37.8 C) (03/30 0741) Pulse Rate:  [57-139] 101 (03/30 0741) Resp:  [0-50] 23 (03/30 0741) BP: (61-148)/(44-106) 128/68 mmHg (03/30 0741) SpO2:  [88 %-100 %] 98 % (03/30 0741) FiO2 (%):  [30 %-60 %] 60 % (03/30 0741)  Hemodynamic parameters for last 24 hours: CVP:  [17 mmHg-31 mmHg] 31 mmHg  Intake/Output from previous day: 03/29 0701 - 03/30 0700 In:  4771.2 [I.V.:3757; TD/DU:202.5; IV Piggyback:400] Out: 1185 [Urine:1185]  Intake/Output this shift:    Vent settings for last 24 hours: Vent Mode:  [-] PRVC FiO2 (%):  [30 %-60 %] 60 % Set Rate:  [22 bmp] 22 bmp Vt Set:  [570 mL] 570 mL PEEP:  [5 cmH20] 5 cmH20 Plateau Pressure:  [25 KYH06-23 cmH20] 29 cmH20  Physical Exam:  General: on vent Neuro: sedated HEENT/Neck: ETT and collar Resp: rhonchi B CVS: RRR now GI: wound clean Extremities: edema 2+  Correction to abdominal exam: mod distention, +BS, NT   Results for orders placed or performed during the hospital encounter of 02/10/2015 (from the past 24 hour(s))  Glucose, capillary     Status: Abnormal   Collection Time: 02/21/15 11:40 AM  Result Value Ref Range   Glucose-Capillary 173 (H) 70 -  99 mg/dL  Glucose, capillary     Status: Abnormal   Collection Time: 02/21/15  2:59 PM  Result Value Ref Range   Glucose-Capillary 141 (H) 70 - 99 mg/dL  Glucose, capillary     Status: Abnormal   Collection Time: 02/21/15  7:46 PM  Result Value Ref Range   Glucose-Capillary 189 (H) 70 - 99 mg/dL   Comment 1 Notify RN   Glucose, capillary     Status: Abnormal   Collection Time: 02/21/15  9:01 PM  Result Value Ref Range   Glucose-Capillary 166 (H) 70 - 99 mg/dL  Glucose, capillary     Status: Abnormal   Collection Time: 02/21/15 11:44 PM  Result Value Ref Range   Glucose-Capillary 183 (H) 70 - 99 mg/dL   Comment 1 Notify RN   I-STAT 3, arterial blood gas (G3+)     Status: Abnormal   Collection Time: 02/22/15  3:40 AM  Result Value Ref Range   pH, Arterial 7.215 (L) 7.350 - 7.450   pCO2 arterial 42.9 35.0 - 45.0 mmHg   pO2, Arterial 59.0 (L) 80.0 - 100.0 mmHg   Bicarbonate 17.5 (L) 20.0 - 24.0 mEq/L   TCO2 19 0 - 100 mmol/L   O2 Saturation 86.0 %   Acid-base deficit 10.0 (H) 0.0 - 2.0 mmol/L   Patient temperature 97.3 F    Collection site RADIAL, ALLEN'S TEST ACCEPTABLE    Drawn by RT    Sample type ARTERIAL   CBC      Status: Abnormal   Collection Time: 02/22/15  3:49 AM  Result Value Ref Range   WBC 7.7 4.0 - 10.5 K/uL   RBC 3.28 (L) 4.22 - 5.81 MIL/uL   Hemoglobin 9.2 (L) 13.0 - 17.0 g/dL   HCT 30.4 (L) 39.0 - 52.0 %   MCV 92.7 78.0 - 100.0 fL   MCH 28.0 26.0 - 34.0 pg   MCHC 30.3 30.0 - 36.0 g/dL   RDW 15.7 (H) 11.5 - 15.5 %   Platelets 213 150 - 400 K/uL  Basic metabolic panel     Status: Abnormal   Collection Time: 02/22/15  3:49 AM  Result Value Ref Range   Sodium 151 (H) 135 - 145 mmol/L   Potassium 5.1 3.5 - 5.1 mmol/L   Chloride 126 (H) 96 - 112 mmol/L   CO2 22 19 - 32 mmol/L   Glucose, Bld 151 (H) 70 - 99 mg/dL   BUN 37 (H) 6 - 23 mg/dL   Creatinine, Ser 1.66 (H) 0.50 - 1.35 mg/dL   Calcium 8.8 8.4 - 10.5 mg/dL   GFR calc non Af Amer 37 (L) >90 mL/min   GFR calc Af Amer 43 (L) >90 mL/min   Anion gap 3 (L) 5 - 15  Glucose, capillary     Status: Abnormal   Collection Time: 02/22/15  3:52 AM  Result Value Ref Range   Glucose-Capillary 135 (H) 70 - 99 mg/dL   Comment 1 Notify RN   Troponin I (q 6hr x 3)     Status: Abnormal   Collection Time: 02/22/15  4:39 AM  Result Value Ref Range   Troponin I 0.06 (H) <0.031 ng/mL  Magnesium     Status: None   Collection Time: 02/22/15  7:05 AM  Result Value Ref Range   Magnesium 2.0 1.5 - 2.5 mg/dL  Glucose, capillary     Status: Abnormal   Collection Time: 02/22/15  7:47 AM  Result Value Ref Range  Glucose-Capillary 189 (H) 70 - 99 mg/dL    Assessment & Plan: Present on Admission:  **None**   LOS: 6 days   Additional comments:I reviewed the patient's new clinical lab test results. and CXR Run over by tractor Ventilator dependent respiratory failure - worsening. Increase PEEP to 8, cont BDs. Bicarb for metab acidosis, F/U ABG Mult B rib FXs  R anterior sternoclavicular dislocation - repair pending, coordination between Dr. Marlou Sa and [facility] Nasal FX and R orbital floor FX - per Dr. 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 Critical Care Total Time*: 51 Minutes  Georganna Skeans, MD, MPH, FACS Trauma: [phone] General Surgery: [phone]  02/22/2015  *Care during the described time interval was provided by me. I have reviewed this patient's available data, including medical history, events of note, physical examination and test results as part of my evaluation.
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
## 2015-02-22 NOTE — Progress Notes (Addendum)
Pt bradycardic 40s-50s and hypotensive, and has decreased urine output. Dr. Rosendo Gros called and new orders carried out.

## 2015-02-22 NOTE — Progress Notes (Signed)
Pt throwing up tube feedings x2, TF stopped and Dr. Rosendo Gros aware.

## 2015-02-22 NOTE — Progress Notes (Signed)
Paged by RN d/t patient's new episodes of brady/tachy rhythms with hypotension.  Per RN rhythms appeared junctional.  EKG revealed no AFib.  BMP with nomral Ca and K.  CXR/Trops pending currently  Cardiology was consulted to assist with patient's new cardiac issues.  They will be by to eval patient at [facility].

## 2015-02-22 NOTE — Progress Notes (Signed)
Pt hypotensive with run of elevated HR. EKG completed and called to Dr. Rosendo Gros. New orders carried out.

## 2015-02-22 NOTE — Progress Notes (Signed)
Patient had multiple vomiting episodes, stooped feeding for while. Vomiting resumed after i restarted feedingat 10cc and gave patient free water per MD order. MD was notify, order to stop feeding and free water and hook patient up to low intermittent suction.

## 2015-02-23 DIAGNOSIS — I4891 Unspecified atrial fibrillation: Secondary | ICD-10-CM

## 2015-02-23 DIAGNOSIS — R6521 Severe sepsis with septic shock: Secondary | ICD-10-CM

## 2015-02-23 DIAGNOSIS — I469 Cardiac arrest, cause unspecified: Secondary | ICD-10-CM | POA: Diagnosis not present

## 2015-02-23 DIAGNOSIS — A419 Sepsis, unspecified organism: Secondary | ICD-10-CM

## 2015-02-23 DIAGNOSIS — I480 Paroxysmal atrial fibrillation: Secondary | ICD-10-CM

## 2015-02-23 DIAGNOSIS — J80 Acute respiratory distress syndrome: Secondary | ICD-10-CM

## 2015-02-23 LAB — URINE CULTURE
COLONY COUNT: NO GROWTH
Culture: NO GROWTH

## 2015-02-23 LAB — GLUCOSE, CAPILLARY
GLUCOSE-CAPILLARY: 164 mg/dL — AB (ref 70–99)
Glucose-Capillary: 190 mg/dL — ABNORMAL HIGH (ref 70–99)
Glucose-Capillary: 135 mg/dL — ABNORMAL HIGH (ref 70–99)
Glucose-Capillary: 136 mg/dL — ABNORMAL HIGH (ref 70–99)
Glucose-Capillary: 125 mg/dL — ABNORMAL HIGH (ref 70–99)
Glucose-Capillary: 107 mg/dL — ABNORMAL HIGH (ref 70–99)

## 2015-02-23 LAB — BASIC METABOLIC PANEL
ANION GAP: 6 (ref 5–15)
BUN: 36 mg/dL — ABNORMAL HIGH (ref 6–23)
CO2: 21 mmol/L (ref 19–32)
Calcium: 8.6 mg/dL (ref 8.4–10.5)
Chloride: 120 mmol/L — ABNORMAL HIGH (ref 96–112)
Creatinine, Ser: 1.71 mg/dL — ABNORMAL HIGH (ref 0.50–1.35)
GFR, EST AFRICAN AMERICAN: 41 mL/min — AB (ref >90)
GFR, EST NON AFRICAN AMERICAN: 36 mL/min — AB (ref >90)
Glucose, Bld: 151 mg/dL — ABNORMAL HIGH (ref 70–99)
POTASSIUM: 4.8 mmol/L (ref 3.5–5.1)
Sodium: 147 mmol/L — ABNORMAL HIGH (ref 135–145)

## 2015-02-23 LAB — CBC
HCT: 28.7 % — ABNORMAL LOW (ref 39.0–52.0)
Hemoglobin: 8.7 g/dL — ABNORMAL LOW (ref 13.0–17.0)
MCH: 28.2 pg (ref 26.0–34.0)
MCHC: 30.3 g/dL (ref 30.0–36.0)
MCV: 92.9 fL (ref 78.0–100.0)
Platelets: 242 10*3/uL (ref 150–400)
RBC: 3.09 MIL/uL — ABNORMAL LOW (ref 4.22–5.81)
RDW: 15.8 % — ABNORMAL HIGH (ref 11.5–15.5)
WBC: 7.3 10*3/uL (ref 4.0–10.5)

## 2015-02-23 LAB — TRIGLYCERIDES: TRIGLYCERIDES: 71 mg/dL (ref <150)

## 2015-02-23 MED ORDER — ENOXAPARIN SODIUM 30 MG/0.3ML ~~LOC~~ SOLN
30.0000 mg | SUBCUTANEOUS | Status: DC
  Filled 2015-02-23: qty 0.3

## 2015-02-23 MED ORDER — MIDAZOLAM HCL 2 MG/2ML IJ SOLN
1.0000 mg | INTRAMUSCULAR | Status: DC | PRN
  Filled 2015-02-23: qty 2

## 2015-02-23 MED ORDER — PROPOFOL 10 MG/ML IV EMUL
0.0000 ug/kg/min | INTRAVENOUS | Status: DC
  Administered 2015-02-23: 5 ug/kg/min via INTRAVENOUS
  Filled 2015-02-23 (×2): qty 100

## 2015-02-23 MED ORDER — LEVALBUTEROL HCL 0.63 MG/3ML IN NEBU: 0.6300 mg | INHALATION_SOLUTION | RESPIRATORY_TRACT | Status: DC | PRN

## 2015-02-23 MED ORDER — NOREPINEPHRINE BITARTRATE 1 MG/ML IV SOLN
2.0000 ug/min | INTRAVENOUS | Status: DC
  Administered 2015-02-23: 30 ug/min via INTRAVENOUS
  Filled 2015-02-23 (×2): qty 16

## 2015-02-23 MED ORDER — SODIUM CHLORIDE 0.9 % IV SOLN: INTRAVENOUS | Status: DC | PRN

## 2015-02-23 MED ORDER — MIDAZOLAM HCL 2 MG/2ML IJ SOLN
1.0000 mg | INTRAMUSCULAR | Status: DC | PRN
  Administered 2015-02-23 (×2): 1 mg via INTRAVENOUS
  Filled 2015-02-23: qty 2

## 2015-02-23 MED FILL — Medication: Qty: 1 | Status: AC

## 2015-02-23 NOTE — Progress Notes (Signed)
Patient ID: Brendan Hall, male   DOB: Dec 26, 1929, 81 y.o.   MRN: [id number] Now in [place] 100's, BP higher. Family at bedside. Georganna Skeans, MD, MPH, FACS Trauma: [phone] General Surgery: [phone]

## 2015-02-23 NOTE — Progress Notes (Signed)
Technical Description:  - CPR performance duration:  83mins   - Was defibrillation or cardioversion used? NO  - Was external pacer placed? No  - Was patient intubated pre/post CPR? Yes    Post CPR evaluation:  - Final Status - Was patient successfully resuscitated ?  Yes - What is current rhythm?   Sinus tach - What is current hemodynamic status?  On levophed 20 mcg (as prior to arrest)  Miscellaneous Information:  - Labs sent, including:   - Primary team notified?    - Family Notified? Yes  - Additional notes/ transfer status:  made DNR,no escalation,  will defer A-line  Rigoberto Noel. MD

## 2015-02-23 NOTE — Progress Notes (Signed)
  Echocardiogram 2D Echocardiogram has been performed.  Darlina Sicilian M 02/23/2015, 9:20 AM

## 2015-02-23 NOTE — Progress Notes (Signed)
Chaplain responded to code blue. Chaplain introduced herself to pt daughter and son, and offered to keep family in prayers. Page chaplain as needed.  Brendan Hall, Brendan Hall, Chaplain 02/23/2015 1:18 PM

## 2015-02-23 NOTE — Progress Notes (Signed)
Patient ID: Brendan Hall, male   DOB: 1930/08/15, 81 y.o.   MRN: [id number] Patient had an asystolic arrest. Dr. Elsworth Soho was present and he had [facility] after one epi and one atropine. He and I spoke with [name]' sons and daughter. We will make him DNR at this time but continue support for now. We advised further goals of care discussion may be warranted should he continue to deteriorate. Georganna Skeans, MD, MPH, FACS Trauma: [phone] General Surgery: [phone]

## 2015-02-23 NOTE — Progress Notes (Signed)
Patient ID: Brendan Hall, male   DOB: Jul 08, 1930, 81 y.o.   MRN: [id number] Trauma PA and I met with his wife and 3 children. I outlined his current clinical situation and highlighted his worsening pulmonary and cardiac status. Dr. Gwenlyn Found did also just follow up for his tachy brady/a fib/and periods of asystole. He does not feel a temporary pacing wire should be placed. I discussed the posiblilty of limiting code status with the family and they are talking it over. I also consulted CCM and I appreciate their assistance. Georganna Skeans, MD, MPH, FACS Trauma: [phone] General Surgery: [phone]
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
## 2015-02-24 ENCOUNTER — Inpatient Hospital Stay (HOSPITAL_COMMUNITY): Payer: Medicare Other

## 2015-02-24 LAB — BASIC METABOLIC PANEL
Anion gap: 5 (ref 5–15)
BUN: 41 mg/dL — AB (ref 6–23)
CO2: 23 mmol/L (ref 19–32)
CREATININE: 2.54 mg/dL — AB (ref 0.50–1.35)
Calcium: 8.3 mg/dL — ABNORMAL LOW (ref 8.4–10.5)
Chloride: 116 mmol/L — ABNORMAL HIGH (ref 96–112)
GFR, EST AFRICAN AMERICAN: 26 mL/min — AB (ref >90)
GFR, EST NON AFRICAN AMERICAN: 22 mL/min — AB (ref >90)
Glucose, Bld: 195 mg/dL — ABNORMAL HIGH (ref 70–99)
Potassium: 7.1 mmol/L (ref 3.5–5.1)
Sodium: 144 mmol/L (ref 135–145)

## 2015-02-24 LAB — CBC
HCT: 29.5 % — ABNORMAL LOW (ref 39.0–52.0)
Hemoglobin: 8.7 g/dL — ABNORMAL LOW (ref 13.0–17.0)
MCH: 28 pg (ref 26.0–34.0)
MCHC: 29.5 g/dL — ABNORMAL LOW (ref 30.0–36.0)
MCV: 94.9 fL (ref 78.0–100.0)
PLATELETS: 303 10*3/uL (ref 150–400)
RBC: 3.11 MIL/uL — AB (ref 4.22–5.81)
RDW: 16.3 % — ABNORMAL HIGH (ref 11.5–15.5)
WBC: 9.2 10*3/uL (ref 4.0–10.5)

## 2015-02-24 LAB — GLUCOSE, CAPILLARY
GLUCOSE-CAPILLARY: 151 mg/dL — AB (ref 70–99)
Glucose-Capillary: 138 mg/dL — ABNORMAL HIGH (ref 70–99)

## 2015-02-24 DEATH — deceased

## 2015-03-01 LAB — CULTURE, BLOOD (ROUTINE X 2)
Culture: NO GROWTH
Culture: NO GROWTH

## 2015-03-07 DIAGNOSIS — S2243XA Multiple fractures of ribs, bilateral, initial encounter for closed fracture: Secondary | ICD-10-CM | POA: Diagnosis present

## 2015-03-07 DIAGNOSIS — J9600 Acute respiratory failure, unspecified whether with hypoxia or hypercapnia: Secondary | ICD-10-CM | POA: Diagnosis present

## 2015-03-07 DIAGNOSIS — S43204A Unspecified dislocation of right sternoclavicular joint, initial encounter: Secondary | ICD-10-CM | POA: Diagnosis present

## 2015-03-07 DIAGNOSIS — E119 Type 2 diabetes mellitus without complications: Secondary | ICD-10-CM | POA: Insufficient documentation

## 2015-03-07 DIAGNOSIS — S0292XA Unspecified fracture of facial bones, initial encounter for closed fracture: Secondary | ICD-10-CM | POA: Diagnosis present

## 2015-03-07 DIAGNOSIS — J449 Chronic obstructive pulmonary disease, unspecified: Secondary | ICD-10-CM | POA: Insufficient documentation

## 2015-03-07 DIAGNOSIS — R918 Other nonspecific abnormal finding of lung field: Secondary | ICD-10-CM | POA: Diagnosis present

## 2015-03-12 NOTE — Progress Notes (Signed)
Accessing pt's chart to obtain data for QI
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
## 2015-03-26 NOTE — Progress Notes (Signed)
294ml Fentanyl wasted with Tawni Carnes, RN.

## 2015-03-26 DEATH — deceased

## 2015-04-13 ENCOUNTER — Encounter (HOSPITAL_COMMUNITY): Payer: Self-pay

## 2016-01-24 ENCOUNTER — Ambulatory Visit: Payer: Medicare Other | Admitting: Vascular Surgery

## 2016-01-24 ENCOUNTER — Encounter (HOSPITAL_COMMUNITY): Payer: Medicare Other
